# Patient Record
Sex: Female | Born: 1965 | Race: Black or African American | Hispanic: No | Marital: Single | Smoking: Current some day smoker
Health system: Southern US, Community
[De-identification: ages and names within clinical notes are randomized; demographics above are authoritative.]

## PROBLEM LIST (undated history)

## (undated) DIAGNOSIS — A599 Trichomoniasis, unspecified: Secondary | ICD-10-CM

## (undated) DIAGNOSIS — K219 Gastro-esophageal reflux disease without esophagitis: Secondary | ICD-10-CM

## (undated) DIAGNOSIS — J329 Chronic sinusitis, unspecified: Secondary | ICD-10-CM

## (undated) DIAGNOSIS — J45909 Unspecified asthma, uncomplicated: Secondary | ICD-10-CM

## (undated) DIAGNOSIS — I38 Endocarditis, valve unspecified: Secondary | ICD-10-CM

## (undated) DIAGNOSIS — R7303 Prediabetes: Secondary | ICD-10-CM

## (undated) DIAGNOSIS — M549 Dorsalgia, unspecified: Secondary | ICD-10-CM

## (undated) DIAGNOSIS — G43909 Migraine, unspecified, not intractable, without status migrainosus: Secondary | ICD-10-CM

## (undated) DIAGNOSIS — J449 Chronic obstructive pulmonary disease, unspecified: Secondary | ICD-10-CM

## (undated) DIAGNOSIS — R011 Cardiac murmur, unspecified: Secondary | ICD-10-CM

## (undated) HISTORY — DX: Cardiac murmur, unspecified: R01.1

## (undated) HISTORY — DX: Gastro-esophageal reflux disease without esophagitis: K21.9

## (undated) HISTORY — DX: Chronic sinusitis, unspecified: J32.9

## (undated) HISTORY — DX: Trichomoniasis, unspecified: A59.9

## (undated) HISTORY — PX: BREAST SURGERY: SHX581

## (undated) SURGERY — Surgical Case
Anesthesia: *Unknown

---

## 2004-04-24 ENCOUNTER — Emergency Department: Payer: Self-pay | Admitting: Unknown Physician Specialty

## 2005-11-16 ENCOUNTER — Emergency Department: Payer: Self-pay | Admitting: Internal Medicine

## 2005-11-21 ENCOUNTER — Ambulatory Visit: Payer: Self-pay | Admitting: Internal Medicine

## 2005-11-24 ENCOUNTER — Emergency Department: Payer: Self-pay | Admitting: Internal Medicine

## 2005-11-24 ENCOUNTER — Other Ambulatory Visit: Payer: Self-pay

## 2007-11-01 ENCOUNTER — Other Ambulatory Visit: Payer: Self-pay

## 2007-11-01 ENCOUNTER — Emergency Department: Payer: Self-pay | Admitting: Emergency Medicine

## 2007-11-05 ENCOUNTER — Ambulatory Visit: Payer: Self-pay | Admitting: Internal Medicine

## 2009-03-14 ENCOUNTER — Emergency Department: Payer: Self-pay | Admitting: Emergency Medicine

## 2013-03-05 LAB — URINALYSIS, COMPLETE
Bacteria: NONE SEEN
Blood: NEGATIVE
Glucose,UR: NEGATIVE mg/dL (ref 0–75)
Ketone: NEGATIVE
Leukocyte Esterase: NEGATIVE
Nitrite: NEGATIVE
Ph: 6 (ref 4.5–8.0)
Protein: 25
RBC,UR: 3 /HPF (ref 0–5)
Specific Gravity: 1.02 (ref 1.003–1.030)
Squamous Epithelial: 5
WBC UR: 2 /HPF (ref 0–5)

## 2013-03-05 LAB — COMPREHENSIVE METABOLIC PANEL
Albumin: 3.9 g/dL (ref 3.4–5.0)
Alkaline Phosphatase: 120 U/L (ref 50–136)
Anion Gap: 6 — ABNORMAL LOW (ref 7–16)
BUN: 7 mg/dL (ref 7–18)
Bilirubin,Total: 0.8 mg/dL (ref 0.2–1.0)
Calcium, Total: 9.3 mg/dL (ref 8.5–10.1)
Chloride: 104 mmol/L (ref 98–107)
Co2: 26 mmol/L (ref 21–32)
Creatinine: 0.86 mg/dL (ref 0.60–1.30)
EGFR (African American): >60
EGFR (Non-African Amer.): >60
Glucose: 90 mg/dL (ref 65–99)
Osmolality: 269 (ref 275–301)
Potassium: 3.6 mmol/L (ref 3.5–5.1)
SGOT(AST): 13 U/L — ABNORMAL LOW (ref 15–37)
SGPT (ALT): 17 U/L (ref 12–78)
Sodium: 136 mmol/L (ref 136–145)
Total Protein: 8.2 g/dL (ref 6.4–8.2)

## 2013-03-05 LAB — CBC
HCT: 40.4 % (ref 35.0–47.0)
HGB: 14.1 g/dL (ref 12.0–16.0)
MCH: 31.9 pg (ref 26.0–34.0)
MCHC: 34.9 g/dL (ref 32.0–36.0)
MCV: 91 fL (ref 80–100)
Platelet: 367 10*3/uL (ref 150–440)
RBC: 4.42 10*6/uL (ref 3.80–5.20)
RDW: 13.8 % (ref 11.5–14.5)
WBC: 19.6 10*3/uL — ABNORMAL HIGH (ref 3.6–11.0)

## 2013-03-05 LAB — LIPASE, BLOOD: Lipase: 57 U/L — ABNORMAL LOW (ref 73–393)

## 2013-03-05 LAB — GC/CHLAMYDIA PROBE AMP

## 2013-03-05 LAB — PREGNANCY, URINE: Pregnancy Test, Urine: NEGATIVE m[IU]/mL

## 2013-03-05 LAB — WET PREP, GENITAL

## 2013-03-06 ENCOUNTER — Observation Stay: Payer: Self-pay | Admitting: Obstetrics & Gynecology

## 2013-03-06 LAB — BASIC METABOLIC PANEL
Anion Gap: 7 (ref 7–16)
BUN: 5 mg/dL — ABNORMAL LOW (ref 7–18)
Calcium, Total: 8 mg/dL — ABNORMAL LOW (ref 8.5–10.1)
Chloride: 108 mmol/L — ABNORMAL HIGH (ref 98–107)
Co2: 23 mmol/L (ref 21–32)
Creatinine: 0.84 mg/dL (ref 0.60–1.30)
EGFR (African American): >60
EGFR (Non-African Amer.): >60
Glucose: 90 mg/dL (ref 65–99)
Osmolality: 272 (ref 275–301)
Potassium: 3.7 mmol/L (ref 3.5–5.1)
Sodium: 138 mmol/L (ref 136–145)

## 2013-03-06 LAB — CBC WITH DIFFERENTIAL/PLATELET
Basophil #: 0.1 10*3/uL (ref 0.0–0.1)
Basophil %: 0.7 %
Eosinophil #: 0.1 10*3/uL (ref 0.0–0.7)
Eosinophil %: 0.9 %
HCT: 33.3 % — ABNORMAL LOW (ref 35.0–47.0)
HGB: 11.4 g/dL — ABNORMAL LOW (ref 12.0–16.0)
Lymphocyte #: 2.7 10*3/uL (ref 1.0–3.6)
Lymphocyte %: 20.2 %
MCH: 31.7 pg (ref 26.0–34.0)
MCHC: 34.4 g/dL (ref 32.0–36.0)
MCV: 92 fL (ref 80–100)
Monocyte #: 0.5 x10 3/mm (ref 0.2–0.9)
Monocyte %: 3.8 %
Neutrophil #: 10 10*3/uL — ABNORMAL HIGH (ref 1.4–6.5)
Neutrophil %: 74.4 %
Platelet: 297 10*3/uL (ref 150–440)
RBC: 3.61 10*6/uL — ABNORMAL LOW (ref 3.80–5.20)
RDW: 13.9 % (ref 11.5–14.5)
WBC: 13.4 10*3/uL — ABNORMAL HIGH (ref 3.6–11.0)

## 2013-03-07 LAB — CBC WITH DIFFERENTIAL/PLATELET
Basophil #: 0 10*3/uL (ref 0.0–0.1)
Basophil %: 0.5 %
Eosinophil #: 0.2 10*3/uL (ref 0.0–0.7)
Eosinophil %: 2.1 %
HCT: 32.2 % — ABNORMAL LOW (ref 35.0–47.0)
HGB: 11.3 g/dL — ABNORMAL LOW (ref 12.0–16.0)
Lymphocyte #: 2.1 10*3/uL (ref 1.0–3.6)
Lymphocyte %: 27.3 %
MCH: 32.4 pg (ref 26.0–34.0)
MCHC: 35.3 g/dL (ref 32.0–36.0)
MCV: 92 fL (ref 80–100)
Monocyte #: 0.4 x10 3/mm (ref 0.2–0.9)
Monocyte %: 5.2 %
Neutrophil #: 5 10*3/uL (ref 1.4–6.5)
Neutrophil %: 64.9 %
Platelet: 283 10*3/uL (ref 150–440)
RBC: 3.5 10*6/uL — ABNORMAL LOW (ref 3.80–5.20)
RDW: 13.7 % (ref 11.5–14.5)
WBC: 7.8 10*3/uL (ref 3.6–11.0)

## 2013-05-22 ENCOUNTER — Inpatient Hospital Stay: Payer: Self-pay | Admitting: Surgery

## 2013-05-22 LAB — BASIC METABOLIC PANEL
Anion Gap: 5 — ABNORMAL LOW (ref 7–16)
BUN: 7 mg/dL (ref 7–18)
Calcium, Total: 9.1 mg/dL (ref 8.5–10.1)
Chloride: 107 mmol/L (ref 98–107)
Co2: 25 mmol/L (ref 21–32)
Creatinine: 0.97 mg/dL (ref 0.60–1.30)
EGFR (African American): >60
EGFR (Non-African Amer.): >60
Glucose: 142 mg/dL — ABNORMAL HIGH (ref 65–99)
Osmolality: 274 (ref 275–301)
Potassium: 3.7 mmol/L (ref 3.5–5.1)
Sodium: 137 mmol/L (ref 136–145)

## 2013-05-22 LAB — CBC WITH DIFFERENTIAL/PLATELET
Basophil #: 0.1 10*3/uL (ref 0.0–0.1)
Basophil %: 0.8 %
Eosinophil #: 0.2 10*3/uL (ref 0.0–0.7)
Eosinophil %: 2.5 %
HCT: 39.5 % (ref 35.0–47.0)
HGB: 13.8 g/dL (ref 12.0–16.0)
Lymphocyte #: 3.2 10*3/uL (ref 1.0–3.6)
Lymphocyte %: 35.4 %
MCH: 31.7 pg (ref 26.0–34.0)
MCHC: 34.8 g/dL (ref 32.0–36.0)
MCV: 91 fL (ref 80–100)
Monocyte #: 0.4 x10 3/mm (ref 0.2–0.9)
Monocyte %: 4.6 %
Neutrophil #: 5.1 10*3/uL (ref 1.4–6.5)
Neutrophil %: 56.7 %
Platelet: 373 10*3/uL (ref 150–440)
RBC: 4.34 10*6/uL (ref 3.80–5.20)
RDW: 13.6 % (ref 11.5–14.5)
WBC: 9 10*3/uL (ref 3.6–11.0)

## 2013-05-23 LAB — BASIC METABOLIC PANEL
Anion Gap: 4 — ABNORMAL LOW (ref 7–16)
BUN: 6 mg/dL — ABNORMAL LOW (ref 7–18)
Calcium, Total: 8.7 mg/dL (ref 8.5–10.1)
Chloride: 108 mmol/L — ABNORMAL HIGH (ref 98–107)
Co2: 27 mmol/L (ref 21–32)
Creatinine: 0.88 mg/dL (ref 0.60–1.30)
EGFR (African American): >60
EGFR (Non-African Amer.): >60
Glucose: 104 mg/dL — ABNORMAL HIGH (ref 65–99)
Osmolality: 275 (ref 275–301)
Potassium: 3.8 mmol/L (ref 3.5–5.1)
Sodium: 139 mmol/L (ref 136–145)

## 2013-05-23 LAB — CBC WITH DIFFERENTIAL/PLATELET
Basophil #: 0.1 10*3/uL (ref 0.0–0.1)
Basophil %: 0.7 %
Eosinophil #: 0.2 10*3/uL (ref 0.0–0.7)
Eosinophil %: 2.8 %
HCT: 36 % (ref 35.0–47.0)
HGB: 12.5 g/dL (ref 12.0–16.0)
Lymphocyte #: 3 10*3/uL (ref 1.0–3.6)
Lymphocyte %: 35.9 %
MCH: 31.7 pg (ref 26.0–34.0)
MCHC: 34.7 g/dL (ref 32.0–36.0)
MCV: 91 fL (ref 80–100)
Monocyte #: 0.5 x10 3/mm (ref 0.2–0.9)
Monocyte %: 6.6 %
Neutrophil #: 4.4 10*3/uL (ref 1.4–6.5)
Neutrophil %: 54 %
Platelet: 343 10*3/uL (ref 150–440)
RBC: 3.95 10*6/uL (ref 3.80–5.20)
RDW: 13.5 % (ref 11.5–14.5)
WBC: 8.3 10*3/uL (ref 3.6–11.0)

## 2013-05-23 LAB — PREGNANCY, URINE: Pregnancy Test, Urine: NEGATIVE m[IU]/mL

## 2013-05-27 LAB — WOUND CULTURE

## 2013-05-27 LAB — PATHOLOGY REPORT

## 2013-09-01 ENCOUNTER — Ambulatory Visit: Payer: Self-pay | Admitting: Urgent Care

## 2014-11-13 NOTE — H&P (Signed)
PATIENT NAME:  Teresa Deleon, BEAR MR#:  [id number] DATE OF BIRTH:  [birth year]  DATE OF ADMISSION:  05/22/2013  CHIEF COMPLAINT: Multiple abscesses.   HISTORY OF PRESENT ILLNESS: This is a patient with approximately 1 week history of multiple abscesses, the largest being in her left breast. She has had multiple others that have been drained in the Emergency Room, but this largest one on the left breast is causing her considerable pain and is too large to be drained in the ER. She ate lunch at 2:00 or 2:30 today. She has had fevers and chills 2 or 3 days ago. She denies nausea or vomiting, is tolerating a regular diet, and in fact ate lunch today. Has never had an episode like this before.  PAST MEDICAL HISTORY: None.   PAST SURGICAL HISTORY: None.   ALLERGIES: No known drug allergies.  MEDICATIONS: She states none, but there is doxycycline and hydrocodone on her medication reconciliation list.   FAMILY HISTORY: Noncontributory.   SOCIAL HISTORY: The patient works as a Quarry manager. She smokes tobacco. Does not drink alcohol.   REVIEW OF SYSTEMS: Ten system review has been performed and negative with the exception of that mentioned in the HPI.  PHYSICAL EXAMINATION: GENERAL: Healthy female patient.  VITAL SIGNS: Temperature 98.1, pulse 91, respirations 18, blood pressure 153/72. Pain scale of 5.  HEENT: No scleral icterus.  NECK: No palpable neck nodes.  CHEST: Clear to auscultation.  CARDIAC: Regular rate and rhythm.  ABDOMEN: Soft, nontender.  EXTREMITIES: Without edema.  NEUROLOGIC: Grossly intact.  INTEGUMENT: Shows multiple 1 or 2 mm unroofed abscesses right chest wall, right abdomen, right groin. She is somewhat hirsute. BREASTS: Shows no mass in either breast, except on the left inframammary fold is considerable induration with considerable erythema, tenderness and an eschar medially.   LABORATORY DATA: Demonstrate normal electrolytes. White blood cell count of 9.0, H and H of 13.8 and  39.5, and a platelet count 373.  IMAGING STUDIES: None performed.   ASSESSMENT AND PLAN: This is a patient with breast abscess. She has never had a mammogram before, but she has multiple abscesses all suggestive of methicillin-resistant Staphylococcus aureus. I have recommended admission to the hospital and because she ate lunch she cannot be given a general anesthetic until late this evening. Therefore, I will recommend keeping her n.p.o. after midnight, starting IV antibiotics and performing incision and drainage of her left breast which clearly shows an abscess that has been present for the last week. We will plan drainage in the operating room tomorrow. I discussed with her the rationale for this, the options of observation, the risk of bleeding, infection, cosmetic deformity including open wound or drain placement and the need for a mammogram in the future. She understood and agreed to proceed. ____________________________ Jerrol Banana Burt Knack, MD rec:sb D: 05/22/2013 16:02:53 ET T: 05/22/2013 16:37:01 ET JOB#: [id number]  cc: Jerrol Banana. Burt Knack, MD, <Dictator> Florene Glen MD ELECTRONICALLY SIGNED 05/22/2013 18:26

## 2014-11-13 NOTE — Op Note (Signed)
PATIENT NAME:  Teresa Deleon, Teresa Deleon MR#:  [id number] DATE OF BIRTH:  02-Apr-1966  DATE OF PROCEDURE:  05/23/2013  PREOPERATIVE DIAGNOSIS:  Left breast abscess.   POSTOPERATIVE DIAGNOSIS:  Left breast abscess.   PROCEDURE:  Incision, drainage and debridement of left breast abscess.   SURGEON:  Richard E. Burt Knack, M.D.   ANESTHESIA:  General with LMA.   INDICATIONS:  This is a patient with a left breast abscess and multiple smaller abscesses across her abdomen and chest. The larger abscess in the left breast requires incision and drainage. Preoperatively, we discussed rationale for surgery, the options of observation, the risk of bleeding, infection, recurrence, cosmetic deformity including open wound. This was all reviewed for her. She understood and agreed to proceed.   FINDINGS:  Multiloculated abscess cavities with purulence that was cultured. Another specimen was the eschar itself, which was excised using sharp dissection.   DESCRIPTION OF PROCEDURE:  The patient was induced with general anesthesia. She was on IV antibiotics and VTE prophylaxis was in place. She was prepped and draped in a sterile fashion. A lenticular-shaped incision was utilized to excise the eschar medially. This was done with a 10 blade and sharp dissection and it was sent off for examination. Placement of a large clamp into the cavity demonstrated two separate cavities; one fairly deep and one more superficial. Cultures were taken. The more superficial abscess tracked towards the nipple areolar complex. A counterincision was made on the lateral side of the breast, and both cavities were connected to this counterincision with blunt dissection, and then two separate Penrose drains were brought in through the wounds and placed into the separate cavities and tied in with 3-0 nylon. An attempt at closing the larger wound loosely was performed with a single horizontal mattress suture, and a single simple suture of 3-0 nylon, and a  sterile dressing was placed.   The patient tolerated the procedure well. There were no complications. She was taken to the Recovery Room in stable condition to be admitted for continued care.   ____________________________ Jerrol Banana. Burt Knack, MD rec:jm D: 05/23/2013 10:36:03 ET T: 05/23/2013 10:45:34 ET JOB#: [id number]  cc: Jerrol Banana. Burt Knack, MD, <Dictator> Florene Glen MD ELECTRONICALLY SIGNED 05/23/2013 11:06

## 2015-01-04 ENCOUNTER — Ambulatory Visit: Payer: Self-pay

## 2015-01-13 ENCOUNTER — Ambulatory Visit
Admission: RE | Admit: 2015-01-13 | Discharge: 2015-01-13 | Disposition: A | Discharge status: Home / Self Care | Payer: Self-pay | Source: Ambulatory Visit | Attending: Oncology | Admitting: Oncology

## 2015-01-13 ENCOUNTER — Ambulatory Visit: Payer: Self-pay | Attending: Oncology

## 2015-01-13 VITALS — BP 123/85 | HR 77 | Temp 98.1°F | Resp 18 | Ht 61.81 in | Wt 152.6 lb

## 2015-01-13 DIAGNOSIS — Z0000 Encounter for general adult medical examination without abnormal findings: Secondary | ICD-10-CM

## 2015-01-13 NOTE — Progress Notes (Signed)
Subjective:     Patient ID: Teresa Deleon, female   DOB: 08/19/1965, 49 y.o.   MRN: [id number]  HPI   Review of Systems     Objective:   Physical Exam  Pulmonary/Chest: Right breast exhibits no inverted nipple, no mass, no nipple discharge, no skin change and no tenderness. Left breast exhibits no inverted nipple, no mass, no nipple discharge, no skin change and no tenderness. Breasts are symmetrical.       Assessment:     49 year old Patient presents for [place] clinic visit.  Patient screened, and meets BCCCP eligibility.  Patient does not have insurance, Medicare or Medicaid.  Handout given on Affordable Care Act.  CBE unremarkable.  Instructed patient on breast self-exam using teach back method. Pelvic exam normal.  Specimen collected for pap.  Sent for baseline bilateral screening mammogram.

## 2015-01-15 LAB — PAP LB AND HPV HIGH-RISK
HPV, high-risk: NEGATIVE
PAP Smear Comment: 0

## 2015-01-18 NOTE — Progress Notes (Signed)
Phoned patient to notify of normal mammogram results, and negative pap results.  Explained pap results showed trichomonas.  Mailed patient education sheet.  Will call in metronidazole 2 grams by mouth once to [facility] on [address]. Per protocol. Copy to [facility].

## 2015-09-24 ENCOUNTER — Ambulatory Visit
Admission: EM | Admit: 2015-09-24 | Discharge: 2015-09-24 | Disposition: A | Discharge status: Home / Self Care | Payer: BLUE CROSS/BLUE SHIELD | Attending: Family Medicine | Admitting: Family Medicine

## 2015-09-24 ENCOUNTER — Ambulatory Visit (INDEPENDENT_AMBULATORY_CARE_PROVIDER_SITE_OTHER): Payer: BLUE CROSS/BLUE SHIELD

## 2015-09-24 ENCOUNTER — Encounter: Payer: Self-pay | Admitting: Emergency Medicine

## 2015-09-24 DIAGNOSIS — R0789 Other chest pain: Secondary | ICD-10-CM

## 2015-09-24 DIAGNOSIS — R079 Chest pain, unspecified: Secondary | ICD-10-CM | POA: Diagnosis not present

## 2015-09-24 DIAGNOSIS — K219 Gastro-esophageal reflux disease without esophagitis: Secondary | ICD-10-CM | POA: Diagnosis not present

## 2015-09-24 MED ORDER — GI COCKTAIL ~~LOC~~
30.0000 mL | Freq: Once | ORAL | Status: AC
  Administered 2015-09-24: 30 mL via ORAL

## 2015-09-24 NOTE — ED Notes (Signed)
Pt presents with upper back pain and chest pain started two weeks ago. Pt reports hx of leaking heart valve, pain comes and goes at times, worse when she takes a deep breath.

## 2015-09-24 NOTE — ED Provider Notes (Signed)
CSN: [id number]     Arrival date & time 09/24/15  1210 History   First MD Initiated Contact with Patient 09/24/15 1246     Chief Complaint  Patient presents with  . Back Pain  . Chest Pain   (Consider location/radiation/quality/duration/timing/severity/associated sxs/prior Treatment) HPI Comments: 50 yo female with a 2-3 weeks h/o mid chest discomfort. States pain comes and goes and burning. States has a h/o acid reflux. Denies diaphoresis, neck/jaw pain, left arm pain, shortness of breath.   Patient is a 50 y.o. female presenting with chest pain. The history is provided by the patient.  Chest Pain Pain location:  Substernal area Pain quality: burning   Pain radiates to:  Does not radiate Pain radiates to the back: no   Pain severity:  Moderate Onset quality:  Sudden Duration:  2 weeks Timing:  Constant Chronicity:  New Relieved by:  None tried Ineffective treatments:  None tried Associated symptoms: no AICD problem, no altered mental status, no anorexia, no back pain, no dizziness, no dysphagia, no fatigue, no fever, no headache, no heartburn, no lower extremity edema, no nausea, no near-syncope, no numbness, no orthopnea, no palpitations, no PND, no shortness of breath, no syncope, not vomiting and no weakness   Risk factors: smoking   Risk factors: no aortic disease, no birth control, no coronary artery disease, no diabetes mellitus, no Marfan's syndrome, not pregnant and no prior DVT/PE     History reviewed. No pertinent past medical history. History reviewed. No pertinent past surgical history. No family history on file. Social History  Substance Use Topics  . Smoking status: Current Some Day Smoker  . Smokeless tobacco: None  . Alcohol Use: No   OB History    No data available     Review of Systems  Constitutional: Negative for fever and fatigue.  HENT: Negative for trouble swallowing.   Respiratory: Negative for shortness of breath.   Cardiovascular: Positive for  chest pain. Negative for palpitations, orthopnea, syncope, PND and near-syncope.  Gastrointestinal: Negative for heartburn, nausea, vomiting and anorexia.  Musculoskeletal: Negative for back pain.  Neurological: Negative for dizziness, weakness, numbness and headaches.    Allergies  Review of patient's allergies indicates no known allergies.  Home Medications   Prior to Admission medications   Not on File   Meds Ordered and Administered this Visit   Medications  gi cocktail (Maalox,Lidocaine,Donnatal) (30 mLs Oral Given 09/24/15 1258)    BP 149/119 mmHg  Pulse 86  Temp(Src) 98.6 F (37 C)  Resp 18  Ht 5' (1.524 m)  Wt 154 lb (69.854 kg)  BMI 30.08 kg/m2  SpO2 97%  LMP  No data found.   Physical Exam  Constitutional: She appears well-developed and well-nourished. No distress.  HENT:  Head: Normocephalic.  Right Ear: Tympanic membrane, external ear and ear canal normal.  Left Ear: Tympanic membrane, external ear and ear canal normal.  Nose: Nose normal.  Mouth/Throat: Uvula is midline, oropharynx is clear and moist and mucous membranes are normal.  Eyes: Conjunctivae and EOM are normal. Pupils are equal, round, and reactive to light. Right eye exhibits no discharge. Left eye exhibits no discharge. No scleral icterus.  Neck: Normal range of motion. Neck supple. No JVD present. No tracheal deviation present. No thyromegaly present.  Cardiovascular: Normal rate, regular rhythm, normal heart sounds and intact distal pulses.   No murmur heard. Pulmonary/Chest: Effort normal and breath sounds normal. No stridor. No respiratory distress. She has no wheezes. She has no  rales. She exhibits no tenderness.  Abdominal: Soft. Bowel sounds are normal. She exhibits no distension and no mass. There is no tenderness. There is no rebound and no guarding.  Lymphadenopathy:    She has no cervical adenopathy.  Neurological: She is alert.  Skin: Skin is warm and dry. No rash noted. She is not  diaphoretic.  Nursing note and vitals reviewed.   ED Course  Procedures (including critical care time)  Labs Review Labs Reviewed - No data to display  Imaging Review Dg Chest 2 View  09/24/2015  CLINICAL DATA:  Pain under LEFT breast for 2 weeks, history asthma, smoking, valvular heart disease EXAM: CHEST  2 VIEW COMPARISON:  By 10/2005 FINDINGS: Normal heart size, mediastinal contours, and pulmonary vascularity. Lungs clear. No pleural effusion or pneumothorax. Bones unremarkable. IMPRESSION: No acute abnormalities. Electronically Signed   By: Lavonia Dana M.D.   On: 09/24/2015 13:41     Visual Acuity Review  Right Eye Distance:   Left Eye Distance:   Bilateral Distance:    Right Eye Near:   Left Eye Near:    Bilateral Near:       EKG: normal EKG, normal sinus rhythm, there are no previous tracings available for comparison; reviewed by me and agree.  MDM   1. Other chest pain   2. Gastroesophageal reflux disease, esophagitis presence not specified    There are no discharge medications for this patient.  1. EKG, x-ray results and diagnosis reviewed with patient 2. rx as per orders above; reviewed possible side effects, interactions, risks and benefits  3. Patient given GI cocktail with resolution of symptoms 4.  Recommend supportive treatment with otc acid reflux medication 5. Follow-up prn if symptoms worsen or don't improve    Norval Gable, MD 09/24/15 2025

## 2015-11-02 ENCOUNTER — Encounter: Payer: Self-pay | Admitting: Emergency Medicine

## 2015-11-02 ENCOUNTER — Emergency Department
Admission: EM | Admit: 2015-11-02 | Discharge: 2015-11-02 | Disposition: A | Discharge status: Home / Self Care | Payer: BLUE CROSS/BLUE SHIELD | Attending: Emergency Medicine | Admitting: Emergency Medicine

## 2015-11-02 DIAGNOSIS — F17200 Nicotine dependence, unspecified, uncomplicated: Secondary | ICD-10-CM | POA: Insufficient documentation

## 2015-11-02 DIAGNOSIS — M5441 Lumbago with sciatica, right side: Secondary | ICD-10-CM | POA: Insufficient documentation

## 2015-11-02 DIAGNOSIS — Z791 Long term (current) use of non-steroidal anti-inflammatories (NSAID): Secondary | ICD-10-CM | POA: Insufficient documentation

## 2015-11-02 HISTORY — DX: Dorsalgia, unspecified: M54.9

## 2015-11-02 MED ORDER — BACLOFEN 10 MG PO TABS: 10.0000 mg | ORAL_TABLET | Freq: Four times a day (QID) | ORAL | Status: AC

## 2015-11-02 MED ORDER — NAPROXEN 500 MG PO TABS: 500.0000 mg | ORAL_TABLET | Freq: Two times a day (BID) | ORAL | Status: DC

## 2015-11-02 MED ORDER — KETOROLAC TROMETHAMINE 60 MG/2ML IM SOLN
60.0000 mg | Freq: Once | INTRAMUSCULAR | Status: AC
  Administered 2015-11-02: 60 mg via INTRAMUSCULAR
  Filled 2015-11-02: qty 2

## 2015-11-02 NOTE — ED Notes (Signed)
Patient presents to the ED with left sided back and hip pain that started yesterday morning.  Patient reports history of back pain.  Patient is in no obvious distress at this time.

## 2015-11-02 NOTE — ED Provider Notes (Signed)
[facility] Emergency Department Provider Note  ____________________________________________  Time seen: Approximately 1:05 PM  I have reviewed the triage vital signs and the nursing notes.   HISTORY  Chief Complaint Back Pain    HPI Teresa Deleon is a 50 y.o. female who presents for evaluation of left-sided back pain times one day. Patient states that she's had similar episodes in the past. Patient states is uncomfortable to lay flat on her back or stand.   Past Medical History  Diagnosis Date  . Back pain     There are no active problems to display for this patient.   History reviewed. No pertinent past surgical history.  Current Outpatient Rx  Name  Route  Sig  Dispense  Refill  . cyclobenzaprine (FLEXERIL) 10 MG tablet   Oral   Take 10 mg by mouth 3 (three) times daily as needed for muscle spasms.         Marland Kitchen ibuprofen (ADVIL,MOTRIN) 800 MG tablet   Oral   Take 800 mg by mouth every 8 (eight) hours as needed.         . loratadine-pseudoephedrine (CLARITIN-D 24-HOUR) 10-240 MG 24 hr tablet   Oral   Take 1 tablet by mouth daily.         . meloxicam (MOBIC) 15 MG tablet   Oral   Take 15 mg by mouth daily.         . baclofen (LIORESAL) 10 MG tablet   Oral   Take 1 tablet (10 mg total) by mouth 4 (four) times daily.   120 tablet   0   . naproxen (NAPROSYN) 500 MG tablet   Oral   Take 1 tablet (500 mg total) by mouth 2 (two) times daily with a meal.   60 tablet   0     Allergies Review of patient's allergies indicates no known allergies.  History reviewed. No pertinent family history.  Social History Social History  Substance Use Topics  . Smoking status: Current Some Day Smoker  . Smokeless tobacco: None  . Alcohol Use: Yes    Review of Systems Constitutional: No fever/chills Cardiovascular: Denies chest pain. Respiratory: Denies shortness of breath. Genitourinary: Negative for dysuria. Musculoskeletal:  Positive for back pain Skin: Negative for rash. Neurological: Negative for headaches, focal weakness or numbness.  10-point ROS otherwise negative.  ____________________________________________   PHYSICAL EXAM:  VITAL SIGNS: ED Triage Vitals  Enc Vitals Group     BP 11/02/15 1254 132/80 mmHg     Pulse Rate 11/02/15 1254 87     Resp 11/02/15 1254 18     Temp 11/02/15 1254 97.7 F (36.5 C)     Temp Source 11/02/15 1254 Oral     SpO2 11/02/15 1254 97 %     Weight 11/02/15 1254 158 lb (71.668 kg)     Height 11/02/15 1254 5\' 1"  (1.549 m)     Head Cir --      Peak Flow --      Pain Score 11/02/15 1243 10     Pain Loc --      Pain Edu? --      Excl. in [place]? --     Constitutional: Alert and oriented. Well appearing and in no acute distress. Cardiovascular: Normal rate, regular rhythm. Grossly normal heart sounds.  Good peripheral circulation. Respiratory: Normal respiratory effort.  No retractions. Lungs CTAB. Gastrointestinal: Soft and nontender. No distention.No CVA tenderness. Musculoskeletal: Positive right-sided paraspinal tenderness. Straight leg raise positive  on the right and negative on the left. Distally neurovascularly intact. No ecchymosis or bruising noted. Neurologic:  Normal speech and language. No gross focal neurologic deficits are appreciated. No gait instability. Skin:  Skin is warm, dry and intact. No rash noted. Psychiatric: Mood and affect are normal. Speech and behavior are normal.  ____________________________________________   LABS (all labs ordered are listed, but only abnormal results are displayed)  Labs Reviewed - No data to display ____________________________________________   PROCEDURES  Procedure(s) performed: None  Critical Care performed: No  ____________________________________________   INITIAL IMPRESSION / ASSESSMENT AND PLAN / ED COURSE  Pertinent labs & imaging results that were available during my care of the patient were  reviewed by me and considered in my medical decision making (see chart for details).  Acute onset of lumbar pain. Rx given for baclofen 10 mg 4 times a day and Naprosyn 500 mg twice a day. Patient follow-up with PCP or return to the ER with any worsening symptomology. Patient voices no other emergency medical complaints at this time. ____________________________________________   FINAL CLINICAL IMPRESSION(S) / ED DIAGNOSES  Final diagnoses:  Right-sided low back pain with right-sided sciatica     This chart was dictated using voice recognition software/Dragon. Despite best efforts to proofread, errors can occur which can change the meaning. Any change was purely unintentional.   Arlyss Repress, PA-C 11/02/15 1330  Lavonia Drafts, MD 11/02/15 [phone]

## 2015-11-02 NOTE — Discharge Instructions (Signed)

## 2015-11-02 NOTE — ED Notes (Signed)
States she is having left sided back pain since yesterday. States she had had similar episodes to right in past. Denies injury

## 2016-02-26 ENCOUNTER — Ambulatory Visit
Admission: EM | Admit: 2016-02-26 | Discharge: 2016-02-26 | Disposition: A | Discharge status: Home / Self Care | Payer: No Typology Code available for payment source | Attending: Family Medicine | Admitting: Family Medicine

## 2016-02-26 ENCOUNTER — Encounter: Payer: Self-pay | Admitting: Gynecology

## 2016-02-26 DIAGNOSIS — K529 Noninfective gastroenteritis and colitis, unspecified: Secondary | ICD-10-CM | POA: Diagnosis not present

## 2016-02-26 HISTORY — DX: Migraine, unspecified, not intractable, without status migrainosus: G43.909

## 2016-02-26 HISTORY — DX: Unspecified asthma, uncomplicated: J45.909

## 2016-02-26 HISTORY — DX: Endocarditis, valve unspecified: I38

## 2016-02-26 MED ORDER — PROMETHAZINE HCL 25 MG PO TABS: 25.0000 mg | ORAL_TABLET | Freq: Four times a day (QID) | ORAL | 0 refills | Status: DC | PRN

## 2016-02-26 MED ORDER — ONDANSETRON HCL 4 MG PO TABS: 4.0000 mg | ORAL_TABLET | Freq: Three times a day (TID) | ORAL | 0 refills | Status: DC | PRN

## 2016-02-26 NOTE — ED Triage Notes (Signed)
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 Pt. Sx vomiting / chills / and diarrhea x this morning.

## 2016-02-26 NOTE — ED Provider Notes (Signed)
CSN: [id number]     Arrival date & time 02/26/16  1300 History   First MD Initiated Contact with Patient 02/26/16 1341     Chief Complaint  Patient presents with  . Emesis  . Diarrhea   HPI the patient presents today for evaluation of nausea and vomiting starting at approximately 4 AM this morning. 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 She reports the person chills at home, did not measure her temperature. She reports 5 episodes of emesis, denies any blood in states that it is yellow in color. 8 episodes of diarrhea, loose stool denies blood or watery appearance. She denies any sharp abdominal pain, does report bloating and 8 cramping sensation. Denies any propane, she is nauseated databases. Denies any recent alcohol intake. She does get her flu shot every year. Past Medical History:  Diagnosis Date  . Asthma   . Back pain   . Leaky heart valve   . Migraine    Past Surgical History:  Procedure Laterality Date  . BREAST SURGERY     left breast abcess   No family history on file. Social History  Substance Use Topics  . Smoking status: Current Some Day Smoker  . Smokeless tobacco: Never Used  . Alcohol use Yes   OB History    No data available     Review of Systems  Constitutional: Positive for chills.  HENT: Negative.   Eyes: Negative.   Respiratory: Negative.   Cardiovascular: Negative.   Gastrointestinal: Positive for abdominal distention, diarrhea, nausea and vomiting.  Endocrine: Negative.     Allergies  Review of patient's allergies indicates no known allergies.  Home Medications   Prior to Admission medications   Medication Sig Start Date End Date Taking? Authorizing Provider  baclofen (LIORESAL) 10 MG tablet Take 1 tablet (10 mg total) by mouth 4 (four) times daily. 11/02/15 11/01/16 Yes Charles M Beers, PA-C  cyclobenzaprine (FLEXERIL) 10 MG  tablet Take 10 mg by mouth 3 (three) times daily as needed for muscle spasms.   Yes Historical Provider, MD  ibuprofen (ADVIL,MOTRIN) 800 MG tablet Take 800 mg by mouth every 8 (eight) hours as needed.   Yes Historical Provider, MD  loratadine-pseudoephedrine (CLARITIN-D 24-HOUR) 10-240 MG 24 hr tablet Take 1 tablet by mouth daily.   Yes Historical Provider, MD  meloxicam (MOBIC) 15 MG tablet Take 15 mg by mouth daily.   Yes Historical Provider, MD  naproxen (NAPROSYN) 500 MG tablet Take 1 tablet (500 mg total) by mouth 2 (two) times daily with a meal. 11/02/15  Yes Pierce Crane Beers, PA-C  ondansetron (ZOFRAN) 4 MG tablet Take 1 tablet (4 mg total) by mouth every 8 (eight) hours as needed for nausea or vomiting. 02/26/16   Lattie Corns, PA-C  promethazine (PHENERGAN) 25 MG tablet Take 1 tablet (25 mg total) by mouth every 6 (six) hours as needed for nausea or vomiting. 02/26/16   Lattie Corns, PA-C   Meds Ordered and Administered this Visit  Medications - No data to display  BP 135/90 (BP Location: Left Arm)   Pulse 73   Temp 98.3 F (36.8 C) (Oral)   Resp 16   Ht 5\' 1"  (1.549 m)   Wt 156 lb (70.8 kg)   SpO2 100%   BMI 29.48 kg/m  No data found.   Physical Exam  Constitutional: Vital signs are  normal. She appears well-developed and well-nourished.  HENT:  Head: Normocephalic and atraumatic.  Mouth/Throat: Mucous membranes are dry. No tonsillar exudate.  Mild erythema to the posterior oropharynx  Neck:  No palpable cervical adenopathy  Cardiovascular: Normal rate, regular rhythm, S1 normal, S2 normal, normal heart sounds and normal pulses.   Pulmonary/Chest: Effort normal and breath sounds normal.  Abdominal: Soft. She exhibits distension. She exhibits no shifting dullness, no fluid wave and no mass. Bowel sounds are increased. There is tenderness in the left lower quadrant. There is no rigidity, no rebound, no guarding and negative Murphy's sign.  Skin: She is not  diaphoretic.    Urgent Care Course   Clinical Course    Procedures (including critical care time)  Labs Review Labs Reviewed - No data to display  Imaging Review No results found.  MDM   1. Gastroenteritis    1.  Treatment options were discussed today with the patient. 2.  The patient was prescribed Zofran in addition to phenergan take for nausea. Encouraged water intake as well as Gatorade and encouraged a bland diet over the next 48 hours. Inform the patient that vitals are stable at today's visit.  Imodium for diarrhea. 3.  The patient was given a workout to keep her out of work for the next 48 hours. She was instructed that if she notices any blood in her vomit or stool or dizziness to return for repeat violation.   Lattie Corns, [place] 02/26/16 [phone]

## 2016-02-26 NOTE — Discharge Instructions (Signed)
-  Zofran for nausea, phenergan if zofran not effective -Bland diet today and tomorrow, water and crackers -If dizzy, increased heart rate or bleeding, follow-up with Urgent Care or ER

## 2016-03-02 ENCOUNTER — Encounter: Payer: Self-pay | Admitting: Medical Oncology

## 2016-03-02 ENCOUNTER — Emergency Department
Admission: EM | Admit: 2016-03-02 | Discharge: 2016-03-02 | Disposition: A | Discharge status: Home / Self Care | Payer: No Typology Code available for payment source | Attending: Emergency Medicine | Admitting: Emergency Medicine

## 2016-03-02 ENCOUNTER — Emergency Department: Payer: No Typology Code available for payment source

## 2016-03-02 DIAGNOSIS — Y999 Unspecified external cause status: Secondary | ICD-10-CM | POA: Insufficient documentation

## 2016-03-02 DIAGNOSIS — Y929 Unspecified place or not applicable: Secondary | ICD-10-CM | POA: Diagnosis not present

## 2016-03-02 DIAGNOSIS — W108XXA Fall (on) (from) other stairs and steps, initial encounter: Secondary | ICD-10-CM | POA: Diagnosis not present

## 2016-03-02 DIAGNOSIS — Y939 Activity, unspecified: Secondary | ICD-10-CM | POA: Diagnosis not present

## 2016-03-02 DIAGNOSIS — M5442 Lumbago with sciatica, left side: Secondary | ICD-10-CM

## 2016-03-02 DIAGNOSIS — M545 Low back pain: Secondary | ICD-10-CM | POA: Diagnosis present

## 2016-03-02 DIAGNOSIS — F17200 Nicotine dependence, unspecified, uncomplicated: Secondary | ICD-10-CM | POA: Diagnosis not present

## 2016-03-02 DIAGNOSIS — J45909 Unspecified asthma, uncomplicated: Secondary | ICD-10-CM | POA: Insufficient documentation

## 2016-03-02 DIAGNOSIS — Z79899 Other long term (current) drug therapy: Secondary | ICD-10-CM | POA: Diagnosis not present

## 2016-03-02 LAB — URINALYSIS COMPLETE WITH MICROSCOPIC (ARMC ONLY)
Bacteria, UA: NONE SEEN
Bilirubin Urine: NEGATIVE
Glucose, UA: NEGATIVE mg/dL
Ketones, ur: NEGATIVE mg/dL
Leukocytes, UA: NEGATIVE
Nitrite: NEGATIVE
Protein, ur: NEGATIVE mg/dL
RBC / HPF: NONE SEEN RBC/hpf (ref 0–5)
Specific Gravity, Urine: 1.01 (ref 1.005–1.030)
WBC, UA: NONE SEEN WBC/hpf (ref 0–5)
pH: 6 (ref 5.0–8.0)

## 2016-03-02 MED ORDER — HYDROCODONE-ACETAMINOPHEN 5-325 MG PO TABS: 1.0000 | ORAL_TABLET | ORAL | 0 refills | Status: DC | PRN

## 2016-03-02 MED ORDER — PREDNISONE 10 MG PO TABS: ORAL_TABLET | ORAL | 0 refills | Status: DC

## 2016-03-02 NOTE — ED Provider Notes (Signed)
[facility] Emergency Department Provider Note  ___________________________________________   First MD Initiated Contact with Patient 03/02/16 1601     (approximate)  I have reviewed the triage vital signs and the nursing notes.   HISTORY  Chief Complaint Back Pain and Fall    HPI Teresa ROETHEL is a 50 y.o. female is here with complaint of back pain after she fellonto a wet wooden step when she slipped on Monday. Patient denies any head injury or loss of consciousness. She has continued to have back pain since that time that now radiates down her left leg. Patient states she is also seen some occasional spotting but states that her last missed her period was a year ago. Patient also gives a history of back pain in the past similar to what she is experiencing now. She has not taken any over-the-counter medication for her pain. She denies any nausea, vomiting, fever or chills. She denies any incontinence of bowel or bladder. Currently she rates her pain as a 10 over 10. Patient did drive herself to the emergency room despite her pain.  Past Medical History:  Diagnosis Date  . Asthma   . Back pain   . Leaky heart valve   . Migraine     There are no active problems to display for this patient.   Past Surgical History:  Procedure Laterality Date  . BREAST SURGERY     left breast abcess    Prior to Admission medications   Medication Sig Start Date End Date Taking? Authorizing Provider  baclofen (LIORESAL) 10 MG tablet Take 1 tablet (10 mg total) by mouth 4 (four) times daily. 11/02/15 11/01/16  Pierce Crane Beers, PA-C  cyclobenzaprine (FLEXERIL) 10 MG tablet Take 10 mg by mouth 3 (three) times daily as needed for muscle spasms.    Historical Provider, MD  HYDROcodone-acetaminophen (NORCO/VICODIN) 5-325 MG tablet Take 1 tablet by mouth every 4 (four) hours as needed for moderate pain. 03/02/16   Johnn Hai, PA-C  ibuprofen (ADVIL,MOTRIN) 800 MG tablet Take  800 mg by mouth every 8 (eight) hours as needed.    Historical Provider, MD  loratadine-pseudoephedrine (CLARITIN-D 24-HOUR) 10-240 MG 24 hr tablet Take 1 tablet by mouth daily.    Historical Provider, MD  meloxicam (MOBIC) 15 MG tablet Take 15 mg by mouth daily.    Historical Provider, MD  ondansetron (ZOFRAN) 4 MG tablet Take 1 tablet (4 mg total) by mouth every 8 (eight) hours as needed for nausea or vomiting. 02/26/16   Lattie Corns, PA-C  predniSONE (DELTASONE) 10 MG tablet Take 6 tablets  today, on day 2 take 5 tablets, day 3 take 4 tablets, day 4 take 3 tablets, day 5 take  2 tablets and 1 tablet the last day 03/02/16   Johnn Hai, PA-C  promethazine (PHENERGAN) 25 MG tablet Take 1 tablet (25 mg total) by mouth every 6 (six) hours as needed for nausea or vomiting. 02/26/16   Lattie Corns, PA-C    Allergies Review of patient's allergies indicates no known allergies.  No family history on file.  Social History Social History  Substance Use Topics  . Smoking status: Current Some Day Smoker  . Smokeless tobacco: Never Used  . Alcohol use Yes    Review of Systems Constitutional: No fever/chills Eyes: No visual changes. ENT: No trauma Cardiovascular: Denies chest pain. Respiratory: Denies shortness of breath. Gastrointestinal: No abdominal pain.  No nausea, no vomiting.   Genitourinary: Negative  for dysuria. Questionable hematuria. Musculoskeletal: Positive for back pain. Skin: Negative for rash. Neurological: Negative for headaches. Positive paresthesias left leg.  10-point ROS otherwise negative.  ____________________________________________   PHYSICAL EXAM:  VITAL SIGNS: ED Triage Vitals  Enc Vitals Group     BP 03/02/16 1547 125/80     Pulse Rate 03/02/16 1547 88     Resp 03/02/16 1547 16     Temp 03/02/16 1547 98.2 F (36.8 C)     Temp Source 03/02/16 1547 Oral     SpO2 03/02/16 1547 98 %     Weight 03/02/16 1548 156 lb (70.8 kg)     Height  03/02/16 1548 5\' 1"  (1.549 m)     Head Circumference --      Peak Flow --      Pain Score 03/02/16 1548 10     Pain Loc --      Pain Edu? --      Excl. in [place]? --     Constitutional: Alert and oriented. Well appearing and in no acute distress. Eyes: Conjunctivae are normal. PERRL. EOMI. Head: Atraumatic. Ears:  EAC's and TM's normal bilaterally. Nose: No congestion/rhinnorhea. Neck: No stridor.   Cardiovascular: Normal rate, regular rhythm. Grossly normal heart sounds.  Good peripheral circulation. Respiratory: Normal respiratory effort.  No retractions. Lungs CTAB. Gastrointestinal: Soft and nontender. No distention. Bowel sounds normoactive 4 quadrants. Musculoskeletal: Examination of the back there is no gross deformity. There is moderate tenderness on palpation of the lumbar spine including L4, L5, S1 area. There is also paravertebral muscle tenderness. Patient was slow to getting from a supine position to sitting position with discomfort. Neurologic:  Normal speech and language. No gross focal neurologic deficits are appreciated. No gait instability. Skin:  Skin is warm, dry and intact. No rash noted. Psychiatric: Mood and affect are normal. Speech and behavior are normal.  ____________________________________________   LABS (all labs ordered are listed, but only abnormal results are displayed)  Labs Reviewed  URINALYSIS COMPLETEWITH MICROSCOPIC (Jud) - Abnormal; Notable for the following:       Result Value   Color, Urine YELLOW (*)    APPearance CLEAR (*)    Hgb urine dipstick 2+ (*)    Squamous Epithelial / LPF 0-5 (*)    All other components within normal limits    RADIOLOGY  Lumbar spine x-ray per radiologist: IMPRESSION: 1. No evidence of acute fracture, malalignment or significant degenerative change. 2.  Aortic Atherosclerosis (ICD10-170.0) ____________________________________________   PROCEDURES  Procedure(s) performed:  None  Procedures  Critical Care performed: No  ____________________________________________   INITIAL IMPRESSION / ASSESSMENT AND PLAN / ED COURSE  Pertinent labs & imaging results that were available during my care of the patient were reviewed by me and considered in my medical decision making (see chart for details).    Clinical Course  Patient was not given any pain medication while in the emergency room since patient was the driver of her vehicle. Patient was given a prescription for prednisone 60 mg 6 day taper along with Norco as needed for severe pain. Patient is to follow-up with her primary care doctor or Dr. Rudene Christians who is on call for orthopedics. Patient was made aware that her urinalysis did not show a urinary tract infection. ____________________________________________   FINAL CLINICAL IMPRESSION(S) / ED DIAGNOSES  Final diagnoses:  Midline low back pain with left-sided sciatica      NEW MEDICATIONS STARTED DURING THIS VISIT:  New Prescriptions   HYDROCODONE-ACETAMINOPHEN (  NORCO/VICODIN) 5-325 MG TABLET    Take 1 tablet by mouth every 4 (four) hours as needed for moderate pain.   PREDNISONE (DELTASONE) 10 MG TABLET    Take 6 tablets  today, on day 2 take 5 tablets, day 3 take 4 tablets, day 4 take 3 tablets, day 5 take  2 tablets and 1 tablet the last day     Note:  This document was prepared using Dragon voice recognition software and may include unintentional dictation errors.   Johnn Hai, PA-C 03/02/16 1809    Nena Polio, MD 03/03/16 [phone]

## 2016-03-02 NOTE — ED Triage Notes (Signed)
Pt reports she began having lower back pain with radiation of pain into legs since she slipped and fell Monday on wet steps. Pt denies head injury.

## 2016-03-02 NOTE — Discharge Instructions (Signed)
Follow-up with your primary care doctor or Dr. Rudene Christians who is on-call for orthopedics if any continued problems with your back. Begin taking prednisone as directed. Take Norco for severe pain if needed. Be aware that this medication can cause drowsiness and should not take while driving.

## 2016-05-04 ENCOUNTER — Encounter: Payer: Self-pay | Admitting: Emergency Medicine

## 2016-05-04 ENCOUNTER — Ambulatory Visit
Admission: EM | Admit: 2016-05-04 | Discharge: 2016-05-04 | Disposition: A | Discharge status: Home / Self Care | Payer: No Typology Code available for payment source | Attending: Emergency Medicine | Admitting: Emergency Medicine

## 2016-05-04 DIAGNOSIS — G43009 Migraine without aura, not intractable, without status migrainosus: Secondary | ICD-10-CM | POA: Diagnosis not present

## 2016-05-04 MED ORDER — KETOROLAC TROMETHAMINE 60 MG/2ML IM SOLN
60.0000 mg | Freq: Once | INTRAMUSCULAR | Status: AC
  Administered 2016-05-04: 60 mg via INTRAMUSCULAR

## 2016-05-04 MED ORDER — NAPROXEN 500 MG PO TABS: 500.0000 mg | ORAL_TABLET | Freq: Two times a day (BID) | ORAL | 0 refills | Status: DC

## 2016-05-04 MED ORDER — ONDANSETRON 8 MG PO TBDP
8.0000 mg | ORAL_TABLET | Freq: Once | ORAL | Status: AC
  Administered 2016-05-04: 8 mg via ORAL

## 2016-05-04 NOTE — ED Provider Notes (Signed)
CSN: [id number]     Arrival date & time 05/04/16  1447 History   First MD Initiated Contact with Patient 05/04/16 1518     Chief Complaint  Patient presents with  . Headache   (Consider location/radiation/quality/duration/timing/severity/associated sxs/prior Treatment) HPI  This a 50 year old female who presents with a four-day history of unremitting right-sided throbbing headache. He has a history of headaches spanning 30 years. He says ever 2 weeks prior to the onset of the severe headache she's had small type headaches in the same area. She's had nausea and vomiting. She has taken some Phenergan at home which helped a little bit. She states that home use of Naprosyn as not provided her any relief. At the present time her headache is rated 7 out of 10. She does exhibit some photosensitivity. She denies any peripheral loss of function or numbness or tingling. She's had no visual disturbances       Past Medical History:  Diagnosis Date  . Asthma   . Back pain   . Leaky heart valve   . Migraine    Past Surgical History:  Procedure Laterality Date  . BREAST SURGERY     left breast abcess   History reviewed. No pertinent family history. Social History  Substance Use Topics  . Smoking status: Current Some Day Smoker  . Smokeless tobacco: Never Used  . Alcohol use Yes   OB History    No data available     Review of Systems  Constitutional: Positive for activity change. Negative for chills, fatigue and fever.  HENT: Positive for congestion.   Neurological: Positive for headaches. Negative for tremors, seizures, syncope, facial asymmetry, speech difficulty, weakness and numbness.  All other systems reviewed and are negative.   Allergies  Review of patient's allergies indicates no known allergies.  Home Medications   Prior to Admission medications   Medication Sig Start Date End Date Taking? Authorizing Provider  baclofen (LIORESAL) 10 MG tablet Take 1 tablet (10 mg  total) by mouth 4 (four) times daily. 11/02/15 11/01/16  Pierce Crane Beers, PA-C  cyclobenzaprine (FLEXERIL) 10 MG tablet Take 10 mg by mouth 3 (three) times daily as needed for muscle spasms.    Historical Provider, MD  HYDROcodone-acetaminophen (NORCO/VICODIN) 5-325 MG tablet Take 1 tablet by mouth every 4 (four) hours as needed for moderate pain. 03/02/16   Johnn Hai, PA-C  ibuprofen (ADVIL,MOTRIN) 800 MG tablet Take 800 mg by mouth every 8 (eight) hours as needed.    Historical Provider, MD  loratadine-pseudoephedrine (CLARITIN-D 24-HOUR) 10-240 MG 24 hr tablet Take 1 tablet by mouth daily.    Historical Provider, MD  meloxicam (MOBIC) 15 MG tablet Take 15 mg by mouth daily.    Historical Provider, MD  naproxen (NAPROSYN) 500 MG tablet Take 1 tablet (500 mg total) by mouth 2 (two) times daily with a meal. 05/04/16   Lorin Picket, PA-C  ondansetron (ZOFRAN) 4 MG tablet Take 1 tablet (4 mg total) by mouth every 8 (eight) hours as needed for nausea or vomiting. 02/26/16   Lattie Corns, PA-C  promethazine (PHENERGAN) 25 MG tablet Take 1 tablet (25 mg total) by mouth every 6 (six) hours as needed for nausea or vomiting. 02/26/16   Lattie Corns, PA-C   Meds Ordered and Administered this Visit   Medications  ketorolac (TORADOL) injection 60 mg (60 mg Intramuscular Given 05/04/16 1544)  ondansetron (ZOFRAN-ODT) disintegrating tablet 8 mg (8 mg Oral Given 05/04/16 1543)  BP 118/64 (BP Location: Left Arm)   Pulse 79   Temp 98.3 F (36.8 C) (Oral)   Resp 16   Ht 5\' 1"  (1.549 m)   Wt 156 lb (70.8 kg)   SpO2 97%   BMI 29.48 kg/m  No data found.   Physical Exam  Constitutional: She is oriented to person, place, and time. She appears well-developed and well-nourished. No distress.  HENT:  Head: Normocephalic and atraumatic.  Right Ear: External ear normal.  Left Ear: External ear normal.  Nose: Nose normal.  Mouth/Throat: Oropharynx is clear and moist. No oropharyngeal  exudate.  Eyes: EOM are normal. Pupils are equal, round, and reactive to light. Right eye exhibits no discharge. Left eye exhibits no discharge.  Neck: Normal range of motion. Neck supple.  Musculoskeletal: Normal range of motion. She exhibits no edema, tenderness or deformity.  Lymphadenopathy:    She has no cervical adenopathy.  Neurological: She is alert and oriented to person, place, and time. She displays normal reflexes. No cranial nerve deficit. She exhibits normal muscle tone. Coordination normal.  Skin: Skin is warm and dry. She is not diaphoretic.  Psychiatric: She has a normal mood and affect. Her behavior is normal. Judgment and thought content normal.  Nursing note and vitals reviewed.   Urgent Care Course   Clinical Course    Procedures (including critical care time)  Labs Review Labs Reviewed - No data to display  Imaging Review No results found.   Visual Acuity Review  Right Eye Distance:   Left Eye Distance:   Bilateral Distance:    Right Eye Near:   Left Eye Near:    Bilateral Near:     Medications  ketorolac (TORADOL) injection 60 mg (60 mg Intramuscular Given 05/04/16 1544)  ondansetron (ZOFRAN-ODT) disintegrating tablet 8 mg (8 mg Oral Given 05/04/16 1543)  Patient's pain level went from a 7-0 after the Toradol injection    MDM   1. Migraine without aura and without status migrainosus, not intractable    Discharge Medication List as of 05/04/2016  4:21 PM    START taking these medications   Details  naproxen (NAPROSYN) 500 MG tablet Take 1 tablet (500 mg total) by mouth 2 (two) times daily with a meal., Starting Thu 05/04/2016, Normal      Plan: 1. Test/x-ray results and diagnosis reviewed with patient 2. rx as per orders; risks, benefits, potential side effects reviewed with patient 3. Recommend supportive treatment with compresses/sleep break cycle. Follow-up with a primary care physician if she has recurrent headaches or if they worsen.  Patient states she has antinausea medicine at home but has run out of Naprosyn which I will refill. 4. F/u prn if symptoms worsen or don't improve     Lorin Picket, PA-C 05/04/16 1627

## 2016-05-04 NOTE — ED Triage Notes (Signed)
Patient c/o headache for the past 4 days. Patient c/o some N/V.

## 2016-12-22 ENCOUNTER — Encounter: Payer: Self-pay | Admitting: Emergency Medicine

## 2016-12-22 ENCOUNTER — Emergency Department
Admission: EM | Admit: 2016-12-22 | Discharge: 2016-12-22 | Disposition: A | Discharge status: Home / Self Care | Payer: No Typology Code available for payment source | Attending: Emergency Medicine | Admitting: Emergency Medicine

## 2016-12-22 DIAGNOSIS — J45909 Unspecified asthma, uncomplicated: Secondary | ICD-10-CM | POA: Insufficient documentation

## 2016-12-22 DIAGNOSIS — F17210 Nicotine dependence, cigarettes, uncomplicated: Secondary | ICD-10-CM | POA: Insufficient documentation

## 2016-12-22 DIAGNOSIS — B9789 Other viral agents as the cause of diseases classified elsewhere: Secondary | ICD-10-CM

## 2016-12-22 DIAGNOSIS — J028 Acute pharyngitis due to other specified organisms: Secondary | ICD-10-CM

## 2016-12-22 DIAGNOSIS — J069 Acute upper respiratory infection, unspecified: Secondary | ICD-10-CM | POA: Insufficient documentation

## 2016-12-22 DIAGNOSIS — Z79899 Other long term (current) drug therapy: Secondary | ICD-10-CM | POA: Insufficient documentation

## 2016-12-22 DIAGNOSIS — M5430 Sciatica, unspecified side: Secondary | ICD-10-CM

## 2016-12-22 MED ORDER — MAGIC MOUTHWASH W/LIDOCAINE: ORAL | 0 refills | Status: DC

## 2016-12-22 MED ORDER — METHYLPREDNISOLONE 4 MG PO TBPK: ORAL_TABLET | ORAL | 0 refills | Status: DC

## 2016-12-22 MED ORDER — PSEUDOEPH-BROMPHEN-DM 30-2-10 MG/5ML PO SYRP: 5.0000 mL | ORAL_SOLUTION | Freq: Four times a day (QID) | ORAL | 0 refills | Status: DC | PRN

## 2016-12-22 NOTE — ED Triage Notes (Signed)
C/O sore throat and body aches x 3 days.  Patient also has some diarrhea earlier this week, which has resolved.

## 2016-12-22 NOTE — ED Triage Notes (Signed)
ARrives with c/o sore throat, body aches, and cough x 2-3 days.

## 2016-12-22 NOTE — ED Provider Notes (Signed)
[facility] Emergency Department Provider Note   ____________________________________________   First MD Initiated Contact with Patient 12/22/16 1348     (approximate)  I have reviewed the triage vital signs and the nursing notes.   HISTORY  Chief Complaint Sore Throat    HPI Teresa Deleon is a 51 y.o. female patient complaining of sore throat and body for 3 days. Patient stated he had diarrhea earlier this week but has resolved. Patient is a congestion and nonproductive cough. Patient describes sore throat "scratchy". No palliative measures for complaint. Patient rated her pain as a 10 over 10.   Past Medical History:  Diagnosis Date  . Asthma   . Back pain   . Leaky heart valve   . Migraine     There are no active problems to display for this patient.   Past Surgical History:  Procedure Laterality Date  . BREAST SURGERY     left breast abcess    Prior to Admission medications   Medication Sig Start Date End Date Taking? Authorizing Provider  brompheniramine-pseudoephedrine-DM 30-2-10 MG/5ML syrup Take 5 mLs by mouth 4 (four) times daily as needed. 12/22/16   Sable Feil, PA-C  cyclobenzaprine (FLEXERIL) 10 MG tablet Take 10 mg by mouth 3 (three) times daily as needed for muscle spasms.    [provider]  HYDROcodone-acetaminophen (NORCO/VICODIN) 5-325 MG tablet Take 1 tablet by mouth every 4 (four) hours as needed for moderate pain. 03/02/16   Johnn Hai, PA-C  ibuprofen (ADVIL,MOTRIN) 800 MG tablet Take 800 mg by mouth every 8 (eight) hours as needed.    [provider]  loratadine-pseudoephedrine (CLARITIN-D 24-HOUR) 10-240 MG 24 hr tablet Take 1 tablet by mouth daily.    [provider]  magic mouthwash w/lidocaine SOLN 30/30/30/64ml of each 12/22/16   Sable Feil, PA-C  meloxicam (MOBIC) 15 MG tablet Take 15 mg by mouth daily.    [provider]  methylPREDNISolone (MEDROL DOSEPAK) 4 MG  TBPK tablet Take Tapered dose as directed 12/22/16   Sable Feil, PA-C  naproxen (NAPROSYN) 500 MG tablet Take 1 tablet (500 mg total) by mouth 2 (two) times daily with a meal. 05/04/16   Lorin Picket, PA-C  ondansetron (ZOFRAN) 4 MG tablet Take 1 tablet (4 mg total) by mouth every 8 (eight) hours as needed for nausea or vomiting. 02/26/16   Lattie Corns, PA-C  promethazine (PHENERGAN) 25 MG tablet Take 1 tablet (25 mg total) by mouth every 6 (six) hours as needed for nausea or vomiting. 02/26/16   Lattie Corns, PA-C    Allergies Patient has no known allergies.  No family history on file.  Social History Social History  Substance Use Topics  . Smoking status: Current Some Day Smoker    Packs/day: 0.50    Types: Cigarettes  . Smokeless tobacco: Never Used  . Alcohol use Yes    Review of Systems  Constitutional: Body in Eyes: No visual changes. ENT: Sore throat and nasal congestion Cardiovascular: Denies chest pain. Respiratory: Denies shortness of breath. Nonproductive cough Gastrointestinal: No abdominal pain.  No nausea, no vomiting.  No diarrhea.  No constipation. Genitourinary: Negative for dysuria. Musculoskeletal: Negative for back pain. Skin: Negative for rash. Neurological: Negative for headaches, focal weakness or numbness.   ____________________________________________   PHYSICAL EXAM:  VITAL SIGNS: ED Triage Vitals [12/22/16 1209]  Enc Vitals Group     BP 122/73     Pulse  Resp 16     Temp 98.5 F (36.9 C)     Temp Source Oral     SpO2 97 %     Weight 156 lb (70.8 kg)     Height 5\' 3"  (1.6 m)     Head Circumference      Peak Flow      Pain Score 7     Pain Loc      Pain Edu?      Excl. in [place]?     Constitutional: Alert and oriented. Well appearing and in no acute distress. Eyes: Conjunctivae are normal. PERRL. EOMI. Head: Atraumatic. Nose: Bilateral maxillary guarding and edematous nasal turbinates . Mouth/Throat: Mucous  membranes are moist.  Oropharynx non-erythematous. Postnasal drainage Neck: No stridor.  No cervical spine tenderness to palpation. Hematological/Lymphatic/Immunilogical: No cervical lymphadenopathy. Cardiovascular: Normal rate, regular rhythm. Grossly normal heart sounds.  Good peripheral circulation. Respiratory: Normal respiratory effort.  No retractions. Lungs CTAB. Gastrointestinal: Soft and nontender. No distention. No abdominal bruits. No CVA tenderness. Musculoskeletal: No lower extremity tenderness nor edema.  No joint effusions. Neurologic:  Normal speech and language. No gross focal neurologic deficits are appreciated. No gait instability. Skin:  Skin is warm, dry and intact. No rash noted. Psychiatric: Mood and affect are normal. Speech and behavior are normal.  ____________________________________________   LABS (all labs ordered are listed, but only abnormal results are displayed)  Labs Reviewed - No data to display ____________________________________________  EKG   ____________________________________________  RADIOLOGY   ____________________________________________   PROCEDURES  Procedure(s) performed: None  Procedures  Critical Care performed: No  ____________________________________________   INITIAL IMPRESSION / ASSESSMENT AND PLAN / ED COURSE  Pertinent labs & imaging results that were available during my care of the patient were reviewed by me and considered in my medical decision making (see chart for details).  Viral pharyngitis with upper rest or infection. Patient also has sciatica  leg pain. Patient given discharge care instructions and advised follow-up PCP if condition persists.      ____________________________________________   FINAL CLINICAL IMPRESSION(S) / ED DIAGNOSES  Final diagnoses:  Sore throat (viral)  Viral URI with cough  Sciatic leg pain      NEW MEDICATIONS STARTED DURING THIS VISIT:  New Prescriptions    BROMPHENIRAMINE-PSEUDOEPHEDRINE-DM 30-2-10 MG/5ML SYRUP    Take 5 mLs by mouth 4 (four) times daily as needed.   MAGIC MOUTHWASH W/LIDOCAINE SOLN    30/30/30/78ml of each   METHYLPREDNISOLONE (MEDROL DOSEPAK) 4 MG TBPK TABLET    Take Tapered dose as directed     Note:  This document was prepared using Dragon voice recognition software and may include unintentional dictation errors.    Sable Feil, PA-C 12/22/16 [place], [place], MD 12/26/16 [phone]

## 2017-02-15 ENCOUNTER — Encounter: Payer: Self-pay | Admitting: Emergency Medicine

## 2017-02-15 ENCOUNTER — Ambulatory Visit (INDEPENDENT_AMBULATORY_CARE_PROVIDER_SITE_OTHER): Payer: Self-pay

## 2017-02-15 ENCOUNTER — Ambulatory Visit
Admission: EM | Admit: 2017-02-15 | Discharge: 2017-02-15 | Disposition: A | Discharge status: Home / Self Care | Payer: No Typology Code available for payment source | Attending: Family Medicine | Admitting: Family Medicine

## 2017-02-15 DIAGNOSIS — M5441 Lumbago with sciatica, right side: Secondary | ICD-10-CM

## 2017-02-15 DIAGNOSIS — M62830 Muscle spasm of back: Secondary | ICD-10-CM

## 2017-02-15 DIAGNOSIS — W19XXXA Unspecified fall, initial encounter: Secondary | ICD-10-CM

## 2017-02-15 MED ORDER — MELOXICAM 15 MG PO TABS: 15.0000 mg | ORAL_TABLET | Freq: Every day | ORAL | 0 refills | Status: DC

## 2017-02-15 MED ORDER — KETOROLAC TROMETHAMINE 60 MG/2ML IM SOLN
60.0000 mg | Freq: Once | INTRAMUSCULAR | Status: AC
  Administered 2017-02-15: 60 mg via INTRAMUSCULAR

## 2017-02-15 MED ORDER — ORPHENADRINE CITRATE ER 100 MG PO TB12: 100.0000 mg | ORAL_TABLET | Freq: Two times a day (BID) | ORAL | 0 refills | Status: DC
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
## 2017-02-15 NOTE — ED Triage Notes (Signed)
Patient c/o lower back pain and stomach pain that started Tuesday night.  Patient states that she fell on Tuesday night.  Patient denies hitting her head and no LOC.

## 2017-02-15 NOTE — ED Notes (Signed)
Patient shows no signs of adverse reaction to medication at this time.  

## 2018-02-06 ENCOUNTER — Other Ambulatory Visit: Payer: Self-pay

## 2018-02-06 ENCOUNTER — Emergency Department: Payer: Commercial Managed Care - HMO

## 2018-02-06 ENCOUNTER — Ambulatory Visit
Admission: EM | Admit: 2018-02-06 | Discharge: 2018-02-06 | Disposition: A | Discharge status: Nursing Home (Includes ICF) | Payer: 59 | Source: Home / Self Care | Attending: Family Medicine | Admitting: Family Medicine

## 2018-02-06 ENCOUNTER — Encounter: Payer: Self-pay | Admitting: Emergency Medicine

## 2018-02-06 ENCOUNTER — Emergency Department
Admission: EM | Admit: 2018-02-06 | Discharge: 2018-02-06 | Disposition: A | Discharge status: Home / Self Care | Payer: Commercial Managed Care - HMO | Attending: Emergency Medicine | Admitting: Emergency Medicine

## 2018-02-06 DIAGNOSIS — Z79899 Other long term (current) drug therapy: Secondary | ICD-10-CM | POA: Diagnosis not present

## 2018-02-06 DIAGNOSIS — R110 Nausea: Secondary | ICD-10-CM

## 2018-02-06 DIAGNOSIS — R1031 Right lower quadrant pain: Secondary | ICD-10-CM | POA: Diagnosis not present

## 2018-02-06 DIAGNOSIS — F17210 Nicotine dependence, cigarettes, uncomplicated: Secondary | ICD-10-CM | POA: Diagnosis not present

## 2018-02-06 DIAGNOSIS — R109 Unspecified abdominal pain: Secondary | ICD-10-CM | POA: Diagnosis not present

## 2018-02-06 DIAGNOSIS — J45909 Unspecified asthma, uncomplicated: Secondary | ICD-10-CM | POA: Diagnosis not present

## 2018-02-06 LAB — URINALYSIS, COMPLETE (UACMP) WITH MICROSCOPIC
Bacteria, UA: NONE SEEN
Bilirubin Urine: NEGATIVE
Bilirubin Urine: NEGATIVE
Glucose, UA: NEGATIVE mg/dL
Glucose, UA: NEGATIVE mg/dL
Hgb urine dipstick: NEGATIVE
Hgb urine dipstick: NEGATIVE
Ketones, ur: NEGATIVE mg/dL
Ketones, ur: NEGATIVE mg/dL
Leukocytes, UA: NEGATIVE
Leukocytes, UA: NEGATIVE
Nitrite: NEGATIVE
Nitrite: NEGATIVE
Protein, ur: NEGATIVE mg/dL
Protein, ur: NEGATIVE mg/dL
Specific Gravity, Urine: 1.02 (ref 1.005–1.030)
Specific Gravity, Urine: 1.003 — ABNORMAL LOW (ref 1.005–1.030)
Squamous Epithelial / LPF: NONE SEEN (ref 0–5)
pH: 6 (ref 5.0–8.0)
pH: 6 (ref 5.0–8.0)

## 2018-02-06 LAB — COMPREHENSIVE METABOLIC PANEL
ALT: 41 U/L (ref 0–44)
AST: 40 U/L (ref 15–41)
Albumin: 4.5 g/dL (ref 3.5–5.0)
Alkaline Phosphatase: 98 U/L (ref 38–126)
Anion gap: 9 (ref 5–15)
BUN: 12 mg/dL (ref 6–20)
CO2: 26 mmol/L (ref 22–32)
Calcium: 9.6 mg/dL (ref 8.9–10.3)
Chloride: 105 mmol/L (ref 98–111)
Creatinine, Ser: 0.72 mg/dL (ref 0.44–1.00)
GFR calc Af Amer: >60 mL/min (ref >60)
GFR calc non Af Amer: >60 mL/min (ref >60)
Glucose, Bld: 109 mg/dL — ABNORMAL HIGH (ref 70–99)
Potassium: 4 mmol/L (ref 3.5–5.1)
Sodium: 140 mmol/L (ref 135–145)
Total Bilirubin: 0.5 mg/dL (ref 0.3–1.2)
Total Protein: 7.8 g/dL (ref 6.5–8.1)

## 2018-02-06 LAB — CBC
HCT: 41.5 % (ref 35.0–47.0)
Hemoglobin: 14.2 g/dL (ref 12.0–16.0)
MCH: 31.3 pg (ref 26.0–34.0)
MCHC: 34.3 g/dL (ref 32.0–36.0)
MCV: 91.4 fL (ref 80.0–100.0)
Platelets: 350 10*3/uL (ref 150–440)
RBC: 4.54 MIL/uL (ref 3.80–5.20)
RDW: 13.4 % (ref 11.5–14.5)
WBC: 8.8 10*3/uL (ref 3.6–11.0)

## 2018-02-06 LAB — PREGNANCY, URINE: Preg Test, Ur: NEGATIVE

## 2018-02-06 LAB — LIPASE, BLOOD: Lipase: 28 U/L (ref 11–51)

## 2018-02-06 MED ORDER — KETOROLAC TROMETHAMINE 30 MG/ML IJ SOLN
INTRAMUSCULAR | Status: AC
  Filled 2018-02-06: qty 1

## 2018-02-06 MED ORDER — IOHEXOL 300 MG/ML  SOLN
100.0000 mL | Freq: Once | INTRAMUSCULAR | Status: AC | PRN
  Administered 2018-02-06: 100 mL via INTRAVENOUS
  Filled 2018-02-06: qty 100

## 2018-02-06 MED ORDER — IOPAMIDOL (ISOVUE-300) INJECTION 61%
30.0000 mL | Freq: Once | INTRAVENOUS | Status: AC | PRN
  Administered 2018-02-06: 30 mL via ORAL
  Filled 2018-02-06: qty 30

## 2018-02-06 MED ORDER — MORPHINE SULFATE (PF) 4 MG/ML IV SOLN
4.0000 mg | Freq: Once | INTRAVENOUS | Status: AC
  Administered 2018-02-06: 4 mg via INTRAVENOUS
  Filled 2018-02-06: qty 1

## 2018-02-06 MED ORDER — OXYCODONE-ACETAMINOPHEN 5-325 MG PO TABS
2.0000 | ORAL_TABLET | Freq: Once | ORAL | Status: AC
  Administered 2018-02-06: 2 via ORAL

## 2018-02-06 MED ORDER — KETOROLAC TROMETHAMINE 30 MG/ML IJ SOLN
15.0000 mg | Freq: Once | INTRAMUSCULAR | Status: AC
  Administered 2018-02-06: 15 mg via INTRAVENOUS

## 2018-02-06 MED ORDER — OXYCODONE-ACETAMINOPHEN 5-325 MG PO TABS
ORAL_TABLET | ORAL | Status: AC
  Filled 2018-02-06: qty 2

## 2018-02-06 MED ORDER — ONDANSETRON HCL 4 MG/2ML IJ SOLN
4.0000 mg | INTRAMUSCULAR | Status: AC
  Administered 2018-02-06: 4 mg via INTRAVENOUS
  Filled 2018-02-06: qty 2

## 2018-02-06 NOTE — ED Notes (Signed)
Patient has h/o ovarian cysts in the past. States she still has an appendix as well and is concerned that she may have appendicitis.  No tenderness noted on palpation.

## 2018-02-06 NOTE — Discharge Instructions (Signed)
Recommend patient go to Emergency Department for further evaluation/management 

## 2018-02-06 NOTE — ED Provider Notes (Signed)
[facility] Emergency Department Provider Note  ____________________________________________   First MD Initiated Contact with Patient 02/06/18 1934     (approximate)  I have reviewed the triage vital signs and the nursing notes.   HISTORY  Chief Complaint Abdominal Pain    HPI Teresa Deleon is a 52 y.o. female who presents for evaluation of acute onset and gradually worsening right lower quadrant abdominal pain that started last night and has been constant since that time.  She has had some mild nausea but no vomiting.  She denies fever/chills, diarrhea, constipation, dysuria, hematuria, chest pain, shortness of breath.  She has not had symptoms similar to this in the past.  Moving around makes it worse and holding still makes it better.  She still has her appendix.  She has no pain in her upper abdomen.  She is wondered about gallbladder disease in the past but she has no upper abdominal pain and eating and drinking does not seem to make the symptoms worse today although she has had less appetite today than usual.  She was seen in the urgent care and recommended to come to the emergency department for further evaluation.  Past Medical History:  Diagnosis Date  . Asthma   . Back pain   . Leaky heart valve   . Migraine     There are no active problems to display for this patient.   Past Surgical History:  Procedure Laterality Date  . BREAST SURGERY     left breast abcess    Prior to Admission medications   Medication Sig Start Date End Date Taking? Authorizing Provider  brompheniramine-pseudoephedrine-DM 30-2-10 MG/5ML syrup Take 5 mLs by mouth 4 (four) times daily as needed. 12/22/16   Sable Feil, PA-C  cyclobenzaprine (FLEXERIL) 10 MG tablet Take 10 mg by mouth 3 (three) times daily as needed for muscle spasms.    [provider]  HYDROcodone-acetaminophen (NORCO/VICODIN) 5-325 MG tablet Take 1 tablet by mouth every 4 (four) hours as  needed for moderate pain. 03/02/16   Johnn Hai, PA-C  ibuprofen (ADVIL,MOTRIN) 800 MG tablet Take 800 mg by mouth every 8 (eight) hours as needed.    [provider]  loratadine-pseudoephedrine (CLARITIN-D 24-HOUR) 10-240 MG 24 hr tablet Take 1 tablet by mouth daily.    [provider]  magic mouthwash w/lidocaine SOLN 30/30/30/65ml of each 12/22/16   Sable Feil, PA-C  meloxicam (MOBIC) 15 MG tablet Take 15 mg by mouth daily.    [provider]  meloxicam (MOBIC) 15 MG tablet Take 1 tablet (15 mg total) by mouth daily. 02/15/17   Frederich Cha, MD  methylPREDNISolone (MEDROL DOSEPAK) 4 MG TBPK tablet Take Tapered dose as directed 12/22/16   Sable Feil, PA-C  naproxen (NAPROSYN) 500 MG tablet Take 1 tablet (500 mg total) by mouth 2 (two) times daily with a meal. 05/04/16   Lorin Picket, PA-C  ondansetron (ZOFRAN) 4 MG tablet Take 1 tablet (4 mg total) by mouth every 8 (eight) hours as needed for nausea or vomiting. 02/26/16   Lattie Corns, PA-C  orphenadrine (NORFLEX) 100 MG tablet Take 1 tablet (100 mg total) by mouth 2 (two) times daily. 02/15/17   Frederich Cha, MD  promethazine (PHENERGAN) 25 MG tablet Take 1 tablet (25 mg total) by mouth every 6 (six) hours as needed for nausea or vomiting. 02/26/16   Lattie Corns, PA-C    Allergies Patient has no known allergies.  No family history on file.  Social History Social History   Tobacco Use  . Smoking status: Current Some Day Smoker    Packs/day: 0.50    Types: Cigarettes  . Smokeless tobacco: Never Used  Substance Use Topics  . Alcohol use: Yes  . Drug use: No    Review of Systems Constitutional: No fever/chills Eyes: No visual changes. ENT: No sore throat. Cardiovascular: Denies chest pain. Respiratory: Denies shortness of breath. Gastrointestinal: Right lower quadrant abdominal pain with nausea as described above Genitourinary: Negative for dysuria. Musculoskeletal: Negative  for neck pain.  Negative for back pain. Integumentary: Negative for rash. Neurological: Negative for headaches, focal weakness or numbness.   ____________________________________________   PHYSICAL EXAM:  VITAL SIGNS: ED Triage Vitals  Enc Vitals Group     BP 02/06/18 1738 140/76     Pulse Rate 02/06/18 1738 84     Resp 02/06/18 1738 18     Temp 02/06/18 1738 98.4 F (36.9 C)     Temp Source 02/06/18 1738 Oral     SpO2 02/06/18 1738 98 %     Weight 02/06/18 1739 70.8 kg (156 lb)     Height 02/06/18 1739 1.549 m (5\' 1" )     Head Circumference --      Peak Flow --      Pain Score 02/06/18 1739 9     Pain Loc --      Pain Edu? --      Excl. in [place]? --     Constitutional: Alert and oriented. Well appearing and in no acute distress. Eyes: Conjunctivae are normal.  Head: Atraumatic. Nose: No congestion/rhinnorhea. Mouth/Throat: Mucous membranes are moist. Neck: No stridor.  No meningeal signs.   Cardiovascular: Normal rate, regular rhythm. Good peripheral circulation. Grossly normal heart sounds. Respiratory: Normal respiratory effort.  No retractions. Lungs CTAB. Gastrointestinal: Soft and nondistended.  Severe tenderness to palpation of the right lower quadrant with some rebound and guarding.  No upper abdominal tenderness including negative Murphy sign and no tenderness to palpation of the epigastrium. Musculoskeletal: No lower extremity tenderness nor edema. No gross deformities of extremities. Neurologic:  Normal speech and language. No gross focal neurologic deficits are appreciated.  Skin:  Skin is warm, dry and intact. No rash noted. Psychiatric: Mood and affect are normal. Speech and behavior are normal.  ____________________________________________   LABS (all labs ordered are listed, but only abnormal results are displayed)  Labs Reviewed  COMPREHENSIVE METABOLIC PANEL - Abnormal; Notable for the following components:      Result Value   Glucose, Bld 109 (*)      All other components within normal limits  URINALYSIS, COMPLETE (UACMP) WITH MICROSCOPIC - Abnormal; Notable for the following components:   Color, Urine COLORLESS (*)    APPearance CLEAR (*)    Specific Gravity, Urine 1.003 (*)    All other components within normal limits  LIPASE, BLOOD  CBC   ____________________________________________  EKG  None - EKG not ordered by ED physician ____________________________________________  RADIOLOGY   ED MD interpretation: No acute abnormalities in the abdomen or pelvis.  Stable adenoma-like lesion since 2014.  Official radiology report(s): Ct Abdomen Pelvis W Contrast  Result Date: 02/06/2018 CLINICAL DATA:  Right lower quadrant abdominal pain since last evening. EXAM: CT ABDOMEN AND PELVIS WITH CONTRAST TECHNIQUE: Multidetector CT imaging of the abdomen and pelvis was performed using the standard protocol following bolus administration of intravenous contrast. CONTRAST:  115mL OMNIPAQUE IOHEXOL 300 MG/ML  SOLN  COMPARISON:  03/05/2013 CT FINDINGS: Lower chest: Normal size heart.  No active pulmonary disease. Hepatobiliary: Diffuse hepatic steatosis. Physiologic distention of the gallbladder without stones. Focal fatty sparing at the gallbladder fossa. No biliary dilatation is identified. Pancreas: Normal Spleen: Normal Adrenals/Urinary Tract: Stable 13 mm short axis right hypodense adrenal lesion unchanged since 2014 consistent with a benign finding likely an adenoma. The left adrenal gland is unremarkable. There is symmetric cortical enhancement of both kidneys without significant cortical thinning, enhancing renal mass, nephrolithiasis nor hydroureteronephrosis. The urinary bladder is unremarkable for the degree of distention. Stomach/Bowel: Stomach is within normal limits. Appendix appears normal. No evidence of bowel wall thickening, distention, or inflammatory changes. Vascular/Lymphatic: Aortic atherosclerosis. No enlarged abdominal or pelvic  lymph nodes. Reproductive: Slightly bulky heterogeneous appearing uterus that may represent subtle fibroids. No adnexal mass. Other: No abdominal wall hernia or abnormality. No abdominopelvic ascites. Musculoskeletal: No acute or significant osseous findings. IMPRESSION: 1. Normal-appearing appendix. No acute bowel obstruction or inflammation. 2. Hepatic steatosis. 3. Stable 13 mm short axis right adrenal nodule consistent with a benign adenoma. 4. Bulky appearing uterus that may reflect diffuse fibroids. No discretely outlined mass is identified. Electronically Signed   By: Ashley Royalty M.D.   On: 02/06/2018 21:13    ____________________________________________   PROCEDURES  Critical Care performed: No   Procedure(s) performed:   Procedures   ____________________________________________   INITIAL IMPRESSION / ASSESSMENT AND PLAN / ED COURSE  As part of my medical decision making, I reviewed the following data within the [place] notes reviewed and incorporated, Labs reviewed , Old chart reviewed and Notes from prior ED visits    Differential diagnosis includes, but is not limited to, diverticulitis, appendicitis, nonspecific colitis, gallbladder disease, musculoskeletal strain, ovarian cyst/torsion.  The history of present illness suggests a process such as diverticulitis or appendicitis, much more likely than ovarian cause.  She has no tenderness in the upper abdomen and I strongly doubt gallbladder disease in this particular case.  She has no leukocytosis, normal conference of metabolic panel, normal urinalysis, and negative lipase.  All this is reassuring but does not rule out acute infection.  On physical exam she is extremely tender to palpation in the appropriate area either for diverticulitis or appendicitis.  She is not having any trouble tolerating oral fluids.  I will evaluate with a CT scan of the abdomen and pelvis with oral and IV contrast as per  protocol.  I am giving morphine 4 mg IV and Zofran 4 mg IV and will reassess after the imaging.  She understands and agrees with the plan.  Clinical Course as of Feb 07 2136  Wed Feb 06, 2018  2134 CT scan shows no acute cause of the patient's pain.  In the absence of any acute lab abnormalities that are abnormal in any acute findings on the CT scan, most likely she is suffering from musculoskeletal pain.  I updated her and her family about the results and encourage the use of over-the-counter ibuprofen and Tylenol and close outpatient follow-up.  She does in fact have a primary care provider and she will call to schedule an appointment.  I gave my usual and customary return precautions.  CT ABDOMEN PELVIS W CONTRAST [CF]  2136 For the moderate pain, I ordered Toradol 15 mg IV and Percocet x 2 prior to discharge.   [CF]    Clinical Course User Index [CF] Hinda Kehr, MD    ____________________________________________  FINAL CLINICAL IMPRESSION(S) /  ED DIAGNOSES  Final diagnoses:  Right sided abdominal pain  Right flank pain     MEDICATIONS GIVEN DURING THIS VISIT:  Medications  ketorolac (TORADOL) 30 MG/ML injection 15 mg (has no administration in time range)  oxyCODONE-acetaminophen (PERCOCET/ROXICET) 5-325 MG per tablet 2 tablet (has no administration in time range)  morphine 4 MG/ML injection 4 mg (4 mg Intravenous Given 02/06/18 2000)  ondansetron (ZOFRAN) injection 4 mg (4 mg Intravenous Given 02/06/18 2001)  iopamidol (ISOVUE-300) 61 % injection 30 mL (30 mLs Oral Contrast Given 02/06/18 2002)  iohexol (OMNIPAQUE) 300 MG/ML solution 100 mL (100 mLs Intravenous Contrast Given 02/06/18 2049)     ED Discharge Orders    None       Note:  This document was prepared using Dragon voice recognition software and may include unintentional dictation errors.    Hinda Kehr, MD 02/06/18 2137

## 2018-02-06 NOTE — ED Triage Notes (Signed)
Pt presents to ED with c/o RLQ abdominal pain. Pt states the pain started either last night or 3 days ago. Pt denies N/V/D or fevers at home, states was referred by UC due to where pain is, pt endorses still having her appendix at this time.

## 2018-02-06 NOTE — Discharge Instructions (Addendum)
You have been seen in the Emergency Department (ED) for abdominal and flank pain.  Your evaluation did not identify a clear cause of your symptoms but was generally reassuring.  Please follow up as instructed above regarding today?s emergent visit and the symptoms that are bothering you.  Return to the ED if your abdominal pain worsens or fails to improve, you develop bloody vomiting, bloody diarrhea, you are unable to tolerate fluids due to vomiting, fever greater than 101, or other symptoms that concern you.

## 2018-02-06 NOTE — ED Provider Notes (Signed)
[facility]    CSN: [id number] Arrival date & time: 02/06/18  1544     History   Chief Complaint Chief Complaint  Patient presents with  . Abdominal Pain    HPI Teresa Deleon is a 52 y.o. female.   52 yo female with a c/o right lower abdominal pain since last night associated with mild nausea but no vomiting, fevers, chills, diarrhea, constipation, dysuria, hematuria. Pain is 8/10, constant and "sharp".  The history is provided by the patient.  Abdominal Pain    Past Medical History:  Diagnosis Date  . Asthma   . Back pain   . Leaky heart valve   . Migraine     There are no active problems to display for this patient.   Past Surgical History:  Procedure Laterality Date  . BREAST SURGERY     left breast abcess    OB History   None      Home Medications    Prior to Admission medications   Medication Sig Start Date End Date Taking? Authorizing Provider  brompheniramine-pseudoephedrine-DM 30-2-10 MG/5ML syrup Take 5 mLs by mouth 4 (four) times daily as needed. 12/22/16   Sable Feil, PA-C  cyclobenzaprine (FLEXERIL) 10 MG tablet Take 10 mg by mouth 3 (three) times daily as needed for muscle spasms.    [provider]  HYDROcodone-acetaminophen (NORCO/VICODIN) 5-325 MG tablet Take 1 tablet by mouth every 4 (four) hours as needed for moderate pain. 03/02/16   Johnn Hai, PA-C  ibuprofen (ADVIL,MOTRIN) 800 MG tablet Take 800 mg by mouth every 8 (eight) hours as needed.    [provider]  loratadine-pseudoephedrine (CLARITIN-D 24-HOUR) 10-240 MG 24 hr tablet Take 1 tablet by mouth daily.    [provider]  magic mouthwash w/lidocaine SOLN 30/30/30/52ml of each 12/22/16   Sable Feil, PA-C  meloxicam (MOBIC) 15 MG tablet Take 15 mg by mouth daily.    [provider]  meloxicam (MOBIC) 15 MG tablet Take 1 tablet (15 mg total) by mouth daily. 02/15/17   Frederich Cha, MD  methylPREDNISolone (MEDROL DOSEPAK) 4  MG TBPK tablet Take Tapered dose as directed 12/22/16   Sable Feil, PA-C  naproxen (NAPROSYN) 500 MG tablet Take 1 tablet (500 mg total) by mouth 2 (two) times daily with a meal. 05/04/16   Lorin Picket, PA-C  ondansetron (ZOFRAN) 4 MG tablet Take 1 tablet (4 mg total) by mouth every 8 (eight) hours as needed for nausea or vomiting. 02/26/16   Lattie Corns, PA-C  orphenadrine (NORFLEX) 100 MG tablet Take 1 tablet (100 mg total) by mouth 2 (two) times daily. 02/15/17   Frederich Cha, MD  promethazine (PHENERGAN) 25 MG tablet Take 1 tablet (25 mg total) by mouth every 6 (six) hours as needed for nausea or vomiting. 02/26/16   Lattie Corns, PA-C    Family History No family history on file.  Social History Social History   Tobacco Use  . Smoking status: Current Some Day Smoker    Packs/day: 0.50    Types: Cigarettes  . Smokeless tobacco: Never Used  Substance Use Topics  . Alcohol use: Yes  . Drug use: No     Allergies   Patient has no known allergies.   Review of Systems Review of Systems  Gastrointestinal: Positive for abdominal pain.     Physical Exam Triage Vital Signs ED Triage Vitals  Enc Vitals Group     BP 02/06/18 1553 Marland Kitchen)  146/84     Pulse Rate 02/06/18 1553 88     Resp 02/06/18 1553 18     Temp 02/06/18 1553 98.3 F (36.8 C)     Temp Source 02/06/18 1553 Oral     SpO2 02/06/18 1553 99 %     Weight --      Height --      Head Circumference --      Peak Flow --      Pain Score 02/06/18 1556 8     Pain Loc --      Pain Edu? --      Excl. in [place]? --    No data found.  Updated Vital Signs BP (!) 146/84 (BP Location: Left Arm)   Pulse 88   Temp 98.3 F (36.8 C) (Oral)   Resp 18   SpO2 99%   Visual Acuity Right Eye Distance:   Left Eye Distance:   Bilateral Distance:    Right Eye Near:   Left Eye Near:    Bilateral Near:     Physical Exam  Constitutional: She appears well-developed and well-nourished. No distress.  Abdominal:  Soft. Bowel sounds are normal. She exhibits no distension and no mass. There is tenderness (right lower quadrant (McBurney's point)). There is no rebound and no guarding.  Skin: She is not diaphoretic.  Nursing note and vitals reviewed.    UC Treatments / Results  Labs (all labs ordered are listed, but only abnormal results are displayed) Labs Reviewed  URINALYSIS, COMPLETE (UACMP) WITH MICROSCOPIC - Abnormal; Notable for the following components:      Result Value   Bacteria, UA RARE (*)    All other components within normal limits  PREGNANCY, URINE    EKG None  Radiology No results found.  Procedures Procedures (including critical care time)  Medications Ordered in UC Medications - No data to display  Initial Impression / Assessment and Plan / UC Course  I have reviewed the triage vital signs and the nursing notes.  Pertinent labs & imaging results that were available during my care of the patient were reviewed by me and considered in my medical decision making (see chart for details).      Final Clinical Impressions(s) / UC Diagnoses   Final diagnoses:  Abdominal pain, acute, right lower quadrant  (suspicious for possible early appendicitis)   Discharge Instructions     Recommend patient go to Emergency Department for further evaluation/management    ED Prescriptions    None      1. Lab results and diagnosis reviewed with patient; recommend patient go to Emergency Department for further evaluation and management   Controlled Substance Prescriptions [place] Controlled Substance Registry consulted? Not Applicable   Norval Gable, MD 02/06/18 2003

## 2018-02-06 NOTE — ED Triage Notes (Signed)
Pt here for abdominal pain in her RLQ since last night and no constipation, diarrhea or any other symptoms reported. States it feels like a sharp shooting pain that comes and goes. Did take tylenol otc without relief.

## 2018-04-04 ENCOUNTER — Ambulatory Visit (INDEPENDENT_AMBULATORY_CARE_PROVIDER_SITE_OTHER): Payer: 59 | Admitting: Internal Medicine

## 2018-04-04 ENCOUNTER — Encounter: Payer: Self-pay | Admitting: Internal Medicine

## 2018-04-04 VITALS — BP 138/78 | HR 82 | Temp 97.9°F | Ht 60.0 in | Wt 156.2 lb

## 2018-04-04 DIAGNOSIS — G8929 Other chronic pain: Secondary | ICD-10-CM | POA: Insufficient documentation

## 2018-04-04 DIAGNOSIS — Z0000 Encounter for general adult medical examination without abnormal findings: Secondary | ICD-10-CM

## 2018-04-04 DIAGNOSIS — Z13818 Encounter for screening for other digestive system disorders: Secondary | ICD-10-CM

## 2018-04-04 DIAGNOSIS — Z124 Encounter for screening for malignant neoplasm of cervix: Secondary | ICD-10-CM

## 2018-04-04 DIAGNOSIS — M5441 Lumbago with sciatica, right side: Secondary | ICD-10-CM

## 2018-04-04 DIAGNOSIS — R9389 Abnormal findings on diagnostic imaging of other specified body structures: Secondary | ICD-10-CM

## 2018-04-04 DIAGNOSIS — Z1389 Encounter for screening for other disorder: Secondary | ICD-10-CM

## 2018-04-04 DIAGNOSIS — N888 Other specified noninflammatory disorders of cervix uteri: Secondary | ICD-10-CM

## 2018-04-04 DIAGNOSIS — E559 Vitamin D deficiency, unspecified: Secondary | ICD-10-CM

## 2018-04-04 DIAGNOSIS — R7303 Prediabetes: Secondary | ICD-10-CM

## 2018-04-04 DIAGNOSIS — D219 Benign neoplasm of connective and other soft tissue, unspecified: Secondary | ICD-10-CM | POA: Diagnosis not present

## 2018-04-04 DIAGNOSIS — K219 Gastro-esophageal reflux disease without esophagitis: Secondary | ICD-10-CM | POA: Insufficient documentation

## 2018-04-04 DIAGNOSIS — J309 Allergic rhinitis, unspecified: Secondary | ICD-10-CM

## 2018-04-04 DIAGNOSIS — Z720 Tobacco use: Secondary | ICD-10-CM

## 2018-04-04 DIAGNOSIS — Z13220 Encounter for screening for lipoid disorders: Secondary | ICD-10-CM

## 2018-04-04 DIAGNOSIS — Z1159 Encounter for screening for other viral diseases: Secondary | ICD-10-CM

## 2018-04-04 DIAGNOSIS — Z20828 Contact with and (suspected) exposure to other viral communicable diseases: Secondary | ICD-10-CM

## 2018-04-04 DIAGNOSIS — G43909 Migraine, unspecified, not intractable, without status migrainosus: Secondary | ICD-10-CM | POA: Insufficient documentation

## 2018-04-04 DIAGNOSIS — Z0184 Encounter for antibody response examination: Secondary | ICD-10-CM

## 2018-04-04 DIAGNOSIS — M5442 Lumbago with sciatica, left side: Secondary | ICD-10-CM

## 2018-04-04 DIAGNOSIS — R011 Cardiac murmur, unspecified: Secondary | ICD-10-CM

## 2018-04-04 DIAGNOSIS — Z23 Encounter for immunization: Secondary | ICD-10-CM

## 2018-04-04 DIAGNOSIS — J329 Chronic sinusitis, unspecified: Secondary | ICD-10-CM | POA: Insufficient documentation

## 2018-04-04 DIAGNOSIS — Z20822 Contact with and (suspected) exposure to covid-19: Secondary | ICD-10-CM

## 2018-04-04 DIAGNOSIS — Z1329 Encounter for screening for other suspected endocrine disorder: Secondary | ICD-10-CM

## 2018-04-04 DIAGNOSIS — Z1231 Encounter for screening mammogram for malignant neoplasm of breast: Secondary | ICD-10-CM

## 2018-04-04 MED ORDER — FLUTICASONE PROPIONATE 50 MCG/ACT NA SUSP: 2.0000 | Freq: Every day | NASAL | 6 refills | Status: DC

## 2018-04-04 MED ORDER — CYCLOBENZAPRINE HCL 5 MG PO TABS: 5.0000 mg | ORAL_TABLET | Freq: Every evening | ORAL | 2 refills | Status: DC | PRN

## 2018-04-04 MED ORDER — LORATADINE 10 MG PO TABS: 10.0000 mg | ORAL_TABLET | Freq: Every day | ORAL | 3 refills | Status: DC | PRN

## 2018-04-04 MED ORDER — MONTELUKAST SODIUM 10 MG PO TABS: 10.0000 mg | ORAL_TABLET | Freq: Every day | ORAL | 3 refills | Status: DC

## 2018-04-04 MED ORDER — NICOTINE POLACRILEX 2 MG MT GUM: 2.0000 mg | CHEWING_GUM | OROMUCOSAL | 2 refills | Status: DC | PRN

## 2018-04-04 NOTE — Patient Instructions (Addendum)
Beschel or Murphy Oil  Try Excedrine migraine  Try nicotine patch 21 daily x 6 weeks then patch 14 x 3 weeks and patch 7 x 3 weeks   F/u in 4-6 weeks   Try to avoid food triggers can try Tums     Food Choices for Gastroesophageal Reflux Disease, Adult When you have gastroesophageal reflux disease (GERD), the foods you eat and your eating habits are very important. Choosing the right foods can help ease your discomfort. What guidelines do I need to follow?  Choose fruits, vegetables, whole grains, and low-fat dairy products.  Choose low-fat meat, fish, and poultry.  Limit fats such as oils, salad dressings, butter, nuts, and avocado.  Keep a food diary. This helps you identify foods that cause symptoms.  Avoid foods that cause symptoms. These may be different for everyone.  Eat small meals often instead of 3 large meals a day.  Eat your meals slowly, in a place where you are relaxed.  Limit fried foods.  Cook foods using methods other than frying.  Avoid drinking alcohol.  Avoid drinking large amounts of liquids with your meals.  Avoid bending over or lying down until 2-3 hours after eating. What foods are not recommended? These are some foods and drinks that may make your symptoms worse: Vegetables Tomatoes. Tomato juice. Tomato and spaghetti sauce. Chili peppers. Onion and garlic. Horseradish. Fruits Oranges, grapefruit, and lemon (fruit and juice). Meats High-fat meats, fish, and poultry. This includes hot dogs, ribs, ham, sausage, salami, and bacon. Dairy Whole milk and chocolate milk. Sour cream. Cream. Butter. Ice cream. Cream cheese. Drinks Coffee and tea. Bubbly (carbonated) drinks or energy drinks. Condiments Hot sauce. Barbecue sauce. Sweets/Desserts Chocolate and cocoa. Donuts. Peppermint and spearmint. Fats and Oils High-fat foods. This includes [place] fries and potato chips. Other Vinegar. Strong spices. This includes black pepper, white  pepper, red pepper, cayenne, curry powder, cloves, ginger, and chili powder. The items listed above may not be a complete list of foods and drinks to avoid. Contact your dietitian for more information. This information is not intended to replace advice given to you by your health care provider. Make sure you discuss any questions you have with your health care provider. Document Released: 01/09/2012 Document Revised: 12/16/2015 Document Reviewed: 05/14/2013 Elsevier Interactive Patient Education  2017 Elsevier Inc.    Back Pain, Adult Many adults have back pain from time to time. Common causes of back pain include:  A strained muscle or ligament.  Wear and tear (degeneration) of the spinal disks.  Arthritis.  A hit to the back.  Back pain can be short-lived (acute) or last a long time (chronic). A physical exam, lab tests, and imaging studies may be done to find the cause of your pain. Follow these instructions at home: Managing pain and stiffness  Take over-the-counter and prescription medicines only as told by your health care provider.  If directed, apply heat to the affected area as often as told by your health care provider. Use the heat source that your health care provider recommends, such as a moist heat pack or a heating pad. ? Place a towel between your skin and the heat source. ? Leave the heat on for 20-30 minutes. ? Remove the heat if your skin turns bright red. This is especially important if you are unable to feel pain, heat, or cold. You have a greater risk of getting burned.  If directed, apply ice to the injured area: ? Put ice in a  plastic bag. ? Place a towel between your skin and the bag. ? Leave the ice on for 20 minutes, 2-3 times a day for the first 2-3 days. Activity  Do not stay in bed. Resting more than 1-2 days can delay your recovery.  Take short walks on even surfaces as soon as you are able. Try to increase the length of time you walk each  day.  Do not sit, drive, or stand in one place for more than 30 minutes at a time. Sitting or standing for long periods of time can put stress on your back.  Use proper lifting techniques. When you bend and lift, use positions that put less stress on your back: ? [place] your knees. ? Keep the load close to your body. ? Avoid twisting.  Exercise regularly as told by your health care provider. Exercising will help your back heal faster. This also helps prevent back injuries by keeping muscles strong and flexible.  Your health care provider may recommend that you see a physical therapist. This person can help you come up with a safe exercise program. Do any exercises as told by your physical therapist. Lifestyle  Maintain a healthy weight. Extra weight puts stress on your back and makes it difficult to have good posture.  Avoid activities or situations that make you feel anxious or stressed. Learn ways to manage anxiety and stress. One way to manage stress is through exercise. Stress and anxiety increase muscle tension and can make back pain worse. General instructions  Sleep on a firm mattress in a comfortable position. Try lying on your side with your knees slightly bent. If you lie on your back, put a pillow under your knees.  Follow your treatment plan as told by your health care provider. This may include: ? Cognitive or behavioral therapy. ? Acupuncture or massage therapy. ? Meditation or yoga. Contact a health care provider if:  You have pain that is not relieved with rest or medicine.  You have increasing pain going down into your legs or buttocks.  Your pain does not improve in 2 weeks.  You have pain at night.  You lose weight.  You have a fever or chills. Get help right away if:  You develop new bowel or bladder control problems.  You have unusual weakness or numbness in your arms or legs.  You develop nausea or vomiting.  You develop abdominal pain.  You feel  faint. Summary  Many adults have back pain from time to time. A physical exam, lab tests, and imaging studies may be done to find the cause of your pain.  Use proper lifting techniques. When you bend and lift, use positions that put less stress on your back.  Take over-the-counter and prescription medicines and apply heat or ice as directed by your health care provider. This information is not intended to replace advice given to you by your health care provider. Make sure you discuss any questions you have with your health care provider. Document Released: 07/10/2005 Document Revised: 08/14/2016 Document Reviewed: 08/14/2016 Elsevier Interactive Patient Education  2018 [employer].  Migraine Headache A migraine headache is an intense, throbbing pain on one side or both sides of the head. Migraines may also cause other symptoms, such as nausea, vomiting, and sensitivity to light and noise. What are the causes? Doing or taking certain things may also trigger migraines, such as:  Alcohol.  Smoking.  Medicines, such as: ? Medicine used to treat chest pain (nitroglycerine). ?  Birth control pills. ? Estrogen pills. ? Certain blood pressure medicines.  Aged cheeses, chocolate, or caffeine.  Foods or drinks that contain nitrates, glutamate, aspartame, or tyramine.  Physical activity.  Other things that may trigger a migraine include:  Menstruation.  Pregnancy.  Hunger.  Stress, lack of sleep, too much sleep, or fatigue.  Weather changes.  What increases the risk? The following factors may make you more likely to experience migraine headaches:  Age. Risk increases with age.  Family history of migraine headaches.  Being Caucasian.  Depression and anxiety.  Obesity.  Being a woman.  Having a hole in the heart (patent foramen ovale) or other heart problems.  What are the signs or symptoms? The main symptom of this condition is pulsating or throbbing pain. Pain  may:  Happen in any area of the head, such as on one side or both sides.  Interfere with daily activities.  Get worse with physical activity.  Get worse with exposure to bright lights or loud noises.  Other symptoms may include:  Nausea.  Vomiting.  Dizziness.  General sensitivity to bright lights, loud noises, or smells.  Before you get a migraine, you may get warning signs that a migraine is developing (aura). An aura may include:  Seeing flashing lights or having blind spots.  Seeing bright spots, halos, or zigzag lines.  Having tunnel vision or blurred vision.  Having numbness or a tingling feeling.  Having trouble talking.  Having muscle weakness.  How is this diagnosed? A migraine headache can be diagnosed based on:  Your symptoms.  A physical exam.  Tests, such as CT scan or MRI of the head. These imaging tests can help rule out other causes of headaches.  Taking fluid from the spine (lumbar puncture) and analyzing it (cerebrospinal fluid analysis, or CSF analysis).  How is this treated? A migraine headache is usually treated with medicines that:  Relieve pain.  Relieve nausea.  Prevent migraines from coming back.  Treatment may also include:  Acupuncture.  Lifestyle changes like avoiding foods that trigger migraines.  Follow these instructions at home: Medicines  Take over-the-counter and prescription medicines only as told by your health care provider.  Do not drive or use heavy machinery while taking prescription pain medicine.  To prevent or treat constipation while you are taking prescription pain medicine, your health care provider may recommend that you: ? Drink enough fluid to keep your urine clear or pale yellow. ? Take over-the-counter or prescription medicines. ? Eat foods that are high in fiber, such as fresh fruits and vegetables, whole grains, and beans. ? Limit foods that are high in fat and processed sugars, such as fried  and sweet foods. Lifestyle  Avoid alcohol use.  Do not use any products that contain nicotine or tobacco, such as cigarettes and e-cigarettes. If you need help quitting, ask your health care provider.  Get at least 8 hours of sleep every night.  Limit your stress. General instructions   Keep a journal to find out what may trigger your migraine headaches. For example, write down: ? What you eat and drink. ? How much sleep you get. ? Any change to your diet or medicines.  If you have a migraine: ? Avoid things that make your symptoms worse, such as bright lights. ? It may help to lie down in a dark, quiet room. ? Do not drive or use heavy machinery. ? Ask your health care provider what activities are safe for you while  you are experiencing symptoms.  Keep all follow-up visits as told by your health care provider. This is important. Contact a health care provider if:  You develop symptoms that are different or more severe than your usual migraine symptoms. Get help right away if:  Your migraine becomes severe.  You have a fever.  You have a stiff neck.  You have vision loss.  Sinusitis, Adult Sinusitis is soreness and inflammation of your sinuses. Sinuses are hollow spaces in the bones around your face. Your sinuses are located: Around your eyes. In the middle of your forehead. Behind your nose. In your cheekbones.  Your sinuses and nasal passages are lined with a stringy fluid (mucus). Mucus normally drains out of your sinuses. When your nasal tissues become inflamed or swollen, the mucus can become trapped or blocked so air cannot flow through your sinuses. This allows bacteria, viruses, and funguses to grow, which leads to infection. Sinusitis can develop quickly and last for 7?10 days (acute) or for more than 12 weeks (chronic). Sinusitis often develops after a cold. What are the causes? This condition is caused by anything that creates swelling in the sinuses or  stops mucus from draining, including: Allergies. Asthma. Bacterial or viral infection. Abnormally shaped bones between the nasal passages. Nasal growths that contain mucus (nasal polyps). Narrow sinus openings. Pollutants, such as chemicals or irritants in the air. A foreign object stuck in the nose. A fungal infection. This is rare.  What increases the risk? The following factors may make you more likely to develop this condition: Having allergies or asthma. Having had a recent cold or respiratory tract infection. Having structural deformities or blockages in your nose or sinuses. Having a weak immune system. Doing a lot of swimming or diving. Overusing nasal sprays. Smoking.  What are the signs or symptoms? The main symptoms of this condition are pain and a feeling of pressure around the affected sinuses. Other symptoms include: Upper toothache. Earache. Headache. Bad breath. Decreased sense of smell and taste. A cough that may get worse at night. Fatigue. Fever. Thick drainage from your nose. The drainage is often green and it may contain pus (purulent). Stuffy nose or congestion. Postnasal drip. This is when extra mucus collects in the throat or back of the nose. Swelling and warmth over the affected sinuses. Sore throat. Sensitivity to light.  How is this diagnosed? This condition is diagnosed based on symptoms, a medical history, and a physical exam. To find out if your condition is acute or chronic, your health care provider may: Look in your nose for signs of nasal polyps. Tap over the affected sinus to check for signs of infection. View the inside of your sinuses using an imaging device that has a light attached (endoscope).  If your health care provider suspects that you have chronic sinusitis, you may also: Be tested for allergies. Have a sample of mucus taken from your nose (nasal culture) and checked for bacteria. Have a mucus sample examined to see if your  sinusitis is related to an allergy.  If your sinusitis does not respond to treatment and it lasts longer than 8 weeks, you may have an MRI or CT scan to check your sinuses. These scans also help to determine how severe your infection is. In rare cases, a bone biopsy may be done to rule out more serious types of fungal sinus disease. How is this treated? Treatment for sinusitis depends on the cause and whether your condition is chronic or  acute. If a virus is causing your sinusitis, your symptoms will go away on their own within 10 days. You may be given medicines to relieve your symptoms, including: Topical nasal decongestants. They shrink swollen nasal passages and let mucus drain from your sinuses. Antihistamines. These drugs block inflammation that is triggered by allergies. This can help to ease swelling in your nose and sinuses. Topical nasal corticosteroids. These are nasal sprays that ease inflammation and swelling in your nose and sinuses. Nasal saline washes. These rinses can help to get rid of thick mucus in your nose.  If your condition is caused by bacteria, you will be given an antibiotic medicine. If your condition is caused by a fungus, you will be given an antifungal medicine. Surgery may be needed to correct underlying conditions, such as narrow nasal passages. Surgery may also be needed to remove polyps. Follow these instructions at home: Medicines Take, use, or apply over-the-counter and prescription medicines only as told by your health care provider. These may include nasal sprays. If you were prescribed an antibiotic medicine, take it as told by your health care provider. Do not stop taking the antibiotic even if you start to feel better. Hydrate and Humidify Drink enough water to keep your urine clear or pale yellow. Staying hydrated will help to thin your mucus. Use a cool mist humidifier to keep the humidity level in your home above 50%. Inhale steam for 10-15 minutes,  3-4 times a day or as told by your health care provider. You can do this in the bathroom while a hot shower is running. Limit your exposure to cool or dry air. Rest Rest as much as possible. Sleep with your head raised (elevated). Make sure to get enough sleep each night. General instructions Apply a warm, moist washcloth to your face 3-4 times a day or as told by your health care provider. This will help with discomfort. Wash your hands often with soap and water to reduce your exposure to viruses and other germs. If soap and water are not available, use hand sanitizer. Do not smoke. Avoid being around people who are smoking (secondhand smoke). Keep all follow-up visits as told by your health care provider. This is important. Contact a health care provider if: You have a fever. Your symptoms get worse. Your symptoms do not improve within 10 days. Get help right away if: You have a severe headache. You have persistent vomiting. You have pain or swelling around your face or eyes. You have vision problems. You develop confusion. Your neck is stiff. You have trouble breathing. This information is not intended to replace advice given to you by your health care provider. Make sure you discuss any questions you have with your health care provider. Document Released: 07/10/2005 Document Revised: 03/05/2016 Document Reviewed: 05/05/2015 Elsevier Interactive Patient Education  2018 [place] muscles feel weak or like you cannot control them.  You start to lose your balance often.  You develop trouble walking.  You faint. This information is not intended to replace advice given to you by your health care provider. Make sure you discuss any questions you have with your health care provider. Document Released: 07/10/2005 Document Revised: 01/28/2016 Document Reviewed: 12/27/2015 Elsevier Interactive Patient Education  2017 [employer].

## 2018-04-04 NOTE — Progress Notes (Signed)
Pre visit review using our clinic review tool, if applicable. No additional management support is needed unless otherwise documented below in the visit note. 

## 2018-04-04 NOTE — Progress Notes (Addendum)
Chief Complaint  Patient presents with  . Establish Care   New patient multiple complaints  1. Chronic low back pain and mid back swelling tried stretching helps sometimes tried Tylenol, Ibuprofen when back flares had flare Tuesday 10/10 today 6-7/10 rads b/l legs and leg weakness she reports fall 3 weeks ago. Reviewed Xrays 2015, 2016, 2018 negative. Mobic 15 mg did not help in the past Norflex or Flexeril did help  She would like to try seeing chiropractor and insurance will cover 3 visits   2. Tobacco abuse smoking 1 ppd wants to quit and try medication to quit   3. C/o chronic sinus issues/allergies and migraines h/a and wants refill of Claritin D advised not good to continue to be on D formulation   4. Reviewed prior US and CT ab/pelvis with enlarged cervix, mild endometrial thickening, adnexal mass and h/o trich.   Review of Systems  Constitutional: Negative for weight loss.  HENT: Negative for hearing loss and sinus pain.   Eyes: Negative for blurred vision.  Respiratory: Negative for shortness of breath.   Cardiovascular: Negative for chest pain.  Gastrointestinal: Negative for abdominal pain.  Musculoskeletal: Positive for back pain and falls.  Skin: Negative for rash.  Neurological: Negative for headaches.  Psychiatric/Behavioral: Negative for depression.   Past Medical History:  Diagnosis Date  . Asthma   . Back pain   . Chronic sinusitis   . GERD (gastroesophageal reflux disease)   . Heart murmur   . Leaky heart valve   . Migraine   . Migraine   . Trichomonas infection    Past Surgical History:  Procedure Laterality Date  . BREAST SURGERY     left breast abcess   Family History  Problem Relation Age of Onset  . Asthma Mother   . COPD Mother   . Diabetes Mother   . Miscarriages / [place] Mother   . Diabetes Father   . Heart disease Father   . Hypertension Father   . Asthma Daughter   . Miscarriages / [place] Daughter   . Arthritis Maternal  Grandmother   . Asthma Maternal Grandmother   . Hyperlipidemia Maternal Grandmother   . Diabetes Maternal Grandfather   . Stroke Paternal Grandmother   . Heart disease Brother    Social History   Socioeconomic History  . Marital status: Single    Spouse name: Not on file  . Number of children: Not on file  . Years of education: Not on file  . Highest education level: Not on file  Occupational History  . Not on file  Social Needs  . Financial resource strain: Not on file  . Food insecurity:    Worry: Not on file    Inability: Not on file  . Transportation needs:    Medical: Not on file    Non-medical: Not on file  Tobacco Use  . Smoking status: Current Some Day Smoker    Packs/day: 0.50    Types: Cigarettes  . Smokeless tobacco: Never Used  . Tobacco comment: 1 ppd   Substance and Sexual Activity  . Alcohol use: Yes  . Drug use: No  . Sexual activity: Yes    Comment: men  Lifestyle  . Physical activity:    Days per week: Not on file    Minutes per session: Not on file  . Stress: Not on file  Relationships  . Social connections:    Talks on phone: Not on file    Gets together: Not  on file    Attends religious service: Not on file    Active member of club or organization: Not on file    Attends meetings of clubs or organizations: Not on file    Relationship status: Not on file  . Intimate partner violence:    Fear of current or ex partner: Not on file    Emotionally abused: Not on file    Physically abused: Not on file    Forced sexual activity: Not on file  Other Topics Concern  . Not on file  Social History Narrative   GED   Works [place]    2 kids    No guns, wears selt belt, safe in relationship    Current Meds  Medication Sig  . [DISCONTINUED] loratadine-pseudoephedrine (CLARITIN-D 24-HOUR) 10-240 MG 24 hr tablet Take 1 tablet by mouth daily.   Allergies  Allergen Reactions  . Shellfish Allergy Itching   Recent Results (from the  past 2160 hour(s))  Pregnancy, urine     Status: None   Collection Time: 02/06/18  4:00 PM  Result Value Ref Range   Preg Test, Ur NEGATIVE NEGATIVE    Comment: Performed at [facility], [address] [place], [place] [address]  Urinalysis, Complete w Microscopic     Status: Abnormal   Collection Time: 02/06/18  4:00 PM  Result Value Ref Range   Color, Urine YELLOW YELLOW   APPearance CLEAR CLEAR   Specific Gravity, Urine 1.020 1.005 - 1.030   pH 6.0 5.0 - 8.0   Glucose, UA NEGATIVE NEGATIVE mg/dL   Hgb urine dipstick NEGATIVE NEGATIVE   Bilirubin Urine NEGATIVE NEGATIVE   Ketones, ur NEGATIVE NEGATIVE mg/dL   Protein, ur NEGATIVE NEGATIVE mg/dL   Nitrite NEGATIVE NEGATIVE   Leukocytes, UA NEGATIVE NEGATIVE   Squamous Epithelial / LPF 0-5 0 - 5   WBC, UA 0-5 0 - 5 WBC/hpf   RBC / HPF 0-5 0 - 5 RBC/hpf   Bacteria, UA RARE (A) NONE SEEN    Comment: Performed at [facility], [address], [place], [place] [address]  Lipase, blood     Status: None   Collection Time: 02/06/18  5:43 PM  Result Value Ref Range   Lipase 28 11 - 51 U/L    Comment: Performed at [facility], [address] [place], [place] [address]  Comprehensive metabolic panel     Status: Abnormal   Collection Time: 02/06/18  5:43 PM  Result Value Ref Range   Sodium 140 135 - 145 mmol/L   Potassium 4.0 3.5 - 5.1 mmol/L   Chloride 105 98 - 111 mmol/L    Comment: Please note change in reference range.   CO2 26 22 - 32 mmol/L   Glucose, Bld 109 (H) 70 - 99 mg/dL    Comment: Please note change in reference range.   BUN 12 6 - 20 mg/dL    Comment: Please note change in reference range.   Creatinine, Ser 0.72 0.44 - 1.00 mg/dL   Calcium 9.6 8.9 - 10.3 mg/dL   Total Protein 7.8 6.5 - 8.1 g/dL   Albumin 4.5 3.5 - 5.0 g/dL   AST 40 15 - 41 U/L   ALT 41 0 - 44 U/L    Comment: Please note change in reference range.   Alkaline Phosphatase 98 38 - 126 U/L   Total Bilirubin 0.5  0.3 - 1.2 mg/dL   GFR calc  non Af Amer >60 >60 mL/min   GFR calc Af Amer >60 >60 mL/min    Comment: (NOTE) The eGFR has been calculated using the CKD EPI equation. This calculation has not been validated in all clinical situations. eGFR's persistently <60 mL/min signify possible Chronic Kidney Disease.    Anion gap 9 5 - 15    Comment: Performed at [facility], [address] [place], [place] [address]  CBC     Status: None   Collection Time: 02/06/18  5:43 PM  Result Value Ref Range   WBC 8.8 3.6 - 11.0 K/uL   RBC 4.54 3.80 - 5.20 MIL/uL   Hemoglobin 14.2 12.0 - 16.0 g/dL   HCT 41.5 35.0 - 47.0 %   MCV 91.4 80.0 - 100.0 fL   MCH 31.3 26.0 - 34.0 pg   MCHC 34.3 32.0 - 36.0 g/dL   RDW 13.4 11.5 - 14.5 %   Platelets 350 150 - 440 K/uL    Comment: Performed at [facility], [address] [place], [place] [address]  Urinalysis, Complete w Microscopic     Status: Abnormal   Collection Time: 02/06/18  5:43 PM  Result Value Ref Range   Color, Urine COLORLESS (A) YELLOW   APPearance CLEAR (A) CLEAR   Specific Gravity, Urine 1.003 (L) 1.005 - 1.030   pH 6.0 5.0 - 8.0   Glucose, UA NEGATIVE NEGATIVE mg/dL   Hgb urine dipstick NEGATIVE NEGATIVE   Bilirubin Urine NEGATIVE NEGATIVE   Ketones, ur NEGATIVE NEGATIVE mg/dL   Protein, ur NEGATIVE NEGATIVE mg/dL   Nitrite NEGATIVE NEGATIVE   Leukocytes, UA NEGATIVE NEGATIVE   WBC, UA 0-5 0 - 5 WBC/hpf   Bacteria, UA NONE SEEN NONE SEEN   Squamous Epithelial / LPF NONE SEEN 0 - 5    Comment: Performed at [facility], [address] [place], [place] [address]   Objective  Body mass index is 30.51 kg/m. Wt Readings from Last 3 Encounters:  04/04/18 156 lb 3.2 oz (70.9 kg)  02/06/18 156 lb (70.8 kg)  02/15/17 156 lb (70.8 kg)   Temp Readings from Last 3 Encounters:  04/04/18 97.9 F (36.6 C) (Oral)  02/06/18 98.4 F (36.9 C) (Oral)  02/06/18 98.3 F (36.8 C) (Oral)   BP Readings from Last 3  Encounters:  04/04/18 138/78  02/06/18 (!) 144/78  02/06/18 (!) 146/84   Pulse Readings from Last 3 Encounters:  04/04/18 82  02/06/18 77  02/06/18 88    Physical Exam  Constitutional: She is oriented to person, place, and time. Vital signs are normal. She appears well-developed and well-nourished. She is cooperative.  HENT:  Head: Normocephalic and atraumatic.  Mouth/Throat: Oropharynx is clear and moist and mucous membranes are normal.  Eyes: Pupils are equal, round, and reactive to light. Conjunctivae are normal.  Cardiovascular: Normal rate and regular rhythm.  Murmur heard. Pulmonary/Chest: Effort normal and breath sounds normal.  Musculoskeletal:       Lumbar back: She exhibits tenderness.  +right leg str8 leg test    Neurological: She is alert and oriented to person, place, and time. Gait normal.  Skin: Skin is warm, dry and intact.  Psychiatric: She has a normal mood and affect. Her speech is normal and behavior is normal. Judgment and thought content normal. Cognition and memory are normal.  Nursing note and vitals reviewed.   Assessment   1. 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 Chronic  sinusitis   3 .Tobacco abuse 1 ppd   4. H/o migraines   5. H/o adnexal enlargement, enlarged cervix, fibroids noted prior CT and US imaging trich, due for pap   6. Cardiac murmur   7. GERD  8. H/o prediabetes   9. HM  Plan   1. Refer MRI w/o and chiropractor beschel  Prn Tylenol and flexeril  mobic did not help in the past  2.  Trial of flonase, claritin and singulair  3. rec cessation  Nicotine gum rx  4.  Disc excedrin migriane otc try and try to control sinus issues see above  5.  Referred ob/gyn Dr. Georgianne Fick needs pap  6. Consider echo in future  7. Monitor  8. Check labs  9.  sch fasting lab s  Flu shot given today  Tdap ? Date review prior records  Consider MMR vaccine and check hep B/C status  Consider shingirx   Per  pt had HIV test [facility] requested records today   Referred mammogram today Referred pap also h/o fibroids, enlarged cervix, adenaxal mass, trichomonas, mild endometrial thickening referred to Dr. Georgianne Fick  Colonoscopy never had disc today  Smoker 1ppd Rx nicotine gum today pt wants to try to quit   02/12/19 c/o cough with mucous, h/a x 1-2 days with boyfriend Shaaron Adler also a pt with similar sx's and one of his friends tested + COVID 29 and he has been around Currently not working and no insurance advised if they decline to test here rec [facility] [address]  Provider: Dr. Olivia Mackie McLean-Scocuzza-Internal Medicine

## 2018-04-18 ENCOUNTER — Ambulatory Visit
Admission: RE | Admit: 2018-04-18 | Discharge: 2018-04-18 | Disposition: A | Discharge status: Home / Self Care | Payer: Commercial Managed Care - HMO | Source: Ambulatory Visit | Attending: Internal Medicine | Admitting: Internal Medicine

## 2018-04-18 DIAGNOSIS — M5116 Intervertebral disc disorders with radiculopathy, lumbar region: Secondary | ICD-10-CM | POA: Insufficient documentation

## 2018-04-18 DIAGNOSIS — G8929 Other chronic pain: Secondary | ICD-10-CM | POA: Diagnosis not present

## 2018-04-18 DIAGNOSIS — M5441 Lumbago with sciatica, right side: Secondary | ICD-10-CM

## 2018-04-18 DIAGNOSIS — M5442 Lumbago with sciatica, left side: Secondary | ICD-10-CM | POA: Diagnosis not present

## 2018-04-18 DIAGNOSIS — M545 Low back pain: Secondary | ICD-10-CM | POA: Diagnosis not present

## 2018-04-19 ENCOUNTER — Telehealth: Payer: Self-pay | Admitting: Internal Medicine

## 2018-04-19 NOTE — Telephone Encounter (Signed)
Left message for pt to return call to office for results. See result note

## 2018-04-19 NOTE — Telephone Encounter (Signed)
Copied from [place] [id number]. Topic: Quick Communication - Lab Results >> Apr 19, 2018  3:15 PM Babs Bertin, CMA wrote: Called patient to inform them of [phone] lab results. When patient returns call, triage nurse may disclose results. >> Apr 19, 2018  3:36 PM Bea Graff, NT wrote: Patient returning call to receive lab results.

## 2018-04-24 ENCOUNTER — Encounter: Payer: Self-pay | Admitting: Obstetrics and Gynecology

## 2018-04-25 ENCOUNTER — Ambulatory Visit (INDEPENDENT_AMBULATORY_CARE_PROVIDER_SITE_OTHER): Payer: 59 | Admitting: Obstetrics and Gynecology

## 2018-04-25 ENCOUNTER — Encounter: Payer: Self-pay | Admitting: Obstetrics and Gynecology

## 2018-04-25 ENCOUNTER — Other Ambulatory Visit (HOSPITAL_COMMUNITY)
Admission: RE | Admit: 2018-04-25 | Discharge: 2018-04-25 | Disposition: A | Discharge status: Home / Self Care | Payer: Commercial Managed Care - HMO | Source: Ambulatory Visit | Attending: Obstetrics and Gynecology | Admitting: Obstetrics and Gynecology

## 2018-04-25 VITALS — BP 128/74 | HR 98 | Ht 61.0 in | Wt 158.0 lb

## 2018-04-25 DIAGNOSIS — Z124 Encounter for screening for malignant neoplasm of cervix: Secondary | ICD-10-CM | POA: Insufficient documentation

## 2018-04-25 DIAGNOSIS — Z1239 Encounter for other screening for malignant neoplasm of breast: Secondary | ICD-10-CM | POA: Diagnosis not present

## 2018-04-25 DIAGNOSIS — N950 Postmenopausal bleeding: Secondary | ICD-10-CM

## 2018-04-25 DIAGNOSIS — D251 Intramural leiomyoma of uterus: Secondary | ICD-10-CM

## 2018-04-25 DIAGNOSIS — Z1211 Encounter for screening for malignant neoplasm of colon: Secondary | ICD-10-CM

## 2018-04-25 NOTE — Progress Notes (Signed)
Gynecology H&P  Chief Complaint:  Chief Complaint  Patient presents with  . Fibroids    referred by [facility] h/o fibroids    History of Present Illness: Patient is a 52 y.o. No obstetric history on file. presents evaluation of postmenopausal bleeding. The patient states she has had a multiple episode(s) of bleeding in the past 2 year(s).  The most recent episode occurred  3  month(s) ago.  The bleeding has been limited to spotting . She describes the blood as Bright red in appearance.  She has had no additional complaints. She deniestrauma or other inciting event, bloating, cramping and early satiety. She does not have a history of abnormal pap smears. There are no other aggravating factors reported. There are no alleviating factors reported. The patient's past medical history is noncontributory.  Recent imaging for unrelated reasons revealed globally enlarge uterus.   She has nothad prior work up for postmenopausal bleeding.  Review of Systems: 10 point review of systems negative unless otherwise noted in HPI  Past Medical History:  Past Medical History:  Diagnosis Date  . Asthma   . Back pain   . Chronic sinusitis   . GERD (gastroesophageal reflux disease)   . Heart murmur   . Leaky heart valve   . Migraine   . Migraine   . Trichomonas infection     Past Surgical History:  Past Surgical History:  Procedure Laterality Date  . BREAST SURGERY     left breast abcess    Family History:  Family History  Problem Relation Age of Onset  . Asthma Mother   . COPD Mother   . Diabetes Mother   . Miscarriages / [place] Mother   . Diabetes Father   . Heart disease Father   . Hypertension Father   . Asthma Daughter   . Miscarriages / [place] Daughter   . Arthritis Maternal Grandmother   . Asthma Maternal Grandmother   . Hyperlipidemia Maternal Grandmother   . Diabetes Maternal Grandfather   . Stroke Paternal Grandmother   . Heart disease Brother     Social History:    Social History   Socioeconomic History  . Marital status: Single    Spouse name: Not on file  . Number of children: Not on file  . Years of education: Not on file  . Highest education level: Not on file  Occupational History  . Not on file  Social Needs  . Financial resource strain: Not on file  . Food insecurity:    Worry: Not on file    Inability: Not on file  . Transportation needs:    Medical: Not on file    Non-medical: Not on file  Tobacco Use  . Smoking status: Current Some Day Smoker    Packs/day: 0.50    Types: Cigarettes  . Smokeless tobacco: Never Used  . Tobacco comment: 1 ppd   Substance and Sexual Activity  . Alcohol use: Yes  . Drug use: No  . Sexual activity: Yes    Comment: men  Lifestyle  . Physical activity:    Days per week: Not on file    Minutes per session: Not on file  . Stress: Not on file  Relationships  . Social connections:    Talks on phone: Not on file    Gets together: Not on file    Attends religious service: Not on file    Active member of club or organization: Not on file    Attends meetings  of clubs or organizations: Not on file    Relationship status: Not on file  . Intimate partner violence:    Fear of current or ex partner: Not on file    Emotionally abused: Not on file    Physically abused: Not on file    Forced sexual activity: Not on file  Other Topics Concern  . Not on file  Social History Narrative   GED   Works [employer]    2 kids    No guns, wears selt belt, safe in relationship     Allergies:  Allergies  Allergen Reactions  . Shellfish Allergy Itching    Medications: Prior to Admission medications   Medication Sig Start Date End Date Taking? Authorizing Provider  cyclobenzaprine (FLEXERIL) 5 MG tablet Take 1 tablet (5 mg total) by mouth at bedtime as needed for muscle spasms. 04/04/18  Yes McLean-Scocuzza, Nino Glow, MD  fluticasone (FLONASE) 50 MCG/ACT nasal spray Place 2 sprays into both  nostrils daily. Prn max 2 sprays 04/04/18  Yes McLean-Scocuzza, Nino Glow, MD  loratadine (CLARITIN) 10 MG tablet Take 1 tablet (10 mg total) by mouth daily as needed for allergies. 04/04/18  Yes McLean-Scocuzza, Nino Glow, MD  montelukast (SINGULAIR) 10 MG tablet Take 1 tablet (10 mg total) by mouth at bedtime. 04/04/18  Yes McLean-Scocuzza, Nino Glow, MD  nicotine polacrilex (NICORETTE) 2 MG gum Take 1 each (2 mg total) by mouth as needed for smoking cessation. q1-2 hours x 6 weeks then q2-4 hours x 3 weeks then q4-8 hrs x 3 weeks. Max 24 pieces in 24 hours 04/04/18  Yes McLean-Scocuzza, Nino Glow, MD    Physical Exam Vitals: Blood pressure 128/74, pulse 98, height 5\' 1"  (1.549 m), weight 158 lb (71.7 kg).  General: NAD HEENT: normocephalic, anicteric Neck: Thyroid non-enlarged, no nodules Pulmonary: No increased work of breathing Abdomen: NABS, soft, non-tender, non-distended.  Umbilicus without lesions.  No hepatomegaly, splenomegaly or masses palpable. No evidence of hernia  Genitourinary:  External: Normal external female genitalia.  Normal urethral meatus, normal  Bartholin's and Skene's glands.    Vagina: Normal vaginal mucosa, no evidence of prolapse.    Cervix: Grossly normal in appearance, no bleeding  Uterus: Enlarged, 15 week size, mobile, normal contour.  No CMT  Adnexa: ovaries non-enlarged, no adnexal masses  Rectal: deferred Extremities: no edema, erythema, or tenderness Neurologic: Grossly intact Psychiatric: mood appropriate, affect full  Assessment: 52 y.o. No obstetric history on file. presenting for evaluation of postmenopausal bleeding  Plan: Problem List Items Addressed This Visit    None    Visit Diagnoses    Postmenopausal bleeding    -  Primary   Relevant Orders   US Transvaginal Non-OB   Intramural leiomyoma of uterus       Relevant Orders   US Transvaginal Non-OB   Screening for malignant neoplasm of cervix       Relevant Orders   Cytology - PAP (Completed)    Breast screening       Relevant Orders   MM 3D SCREEN BREAST BILATERAL   Colon cancer screening       Relevant Orders   Ambulatory referral to Gastroenterology      1) We discussed that menopause is a clinical diagnosis made after 12 months of amenorrhea.  The average age of menopause in the  [place] population is 12 but there may be significant variation.  Any bleeding that happens after a 12 month period  of amenorrhea warrants further work.  Possible etiologies of postmenopausal bleeding were discussed with the patient today.  These may range from benign etiologies such as urethral prolapse and atrophy, to indeterminate lesions such as submucosal fibroids or polyps which would require resection to accurately evaluate. The role of unopposed estrogen in the development of  dndometrial hyperplasia or carcinoma is discussed.  The risk of endometrial hyperplasia is linearly correlated with increasing BMI given the production of estrone by adipose tissue.  Work up will be include transvaginal ultrasound to assess the thickness of the endometrial lining as well as to assess for focal uterine lesions.  Negative ultrasound evaluation, defined as the absence of focal lesions and endometrial stripe of <22mm, effectively rules out carcinoma and confirms atrophy as the most likely etiology.  Should focal lesions be present these generally require hysteroscopic resection.  Should lining be greater >38mm endometrial biopsy is warranted to rule out hyperplasia or frank endometrial cancer.  Continued episodes of bleeding despite negative ultrasound also warrant endometrial sampling.  As the cervical pathology may also be implicated in postmenopausal bleeding prior cervical cytology was reviewed and repeated if required per ASCCP guidelines.   2) Evaluation by pelvc ultrasound scheduled, with follow up after [place]. EMB discussed and may be performed as well. Pros and cons of these modalities of testing discussed.   3)  Return in about 1 week (around 05/02/2018) for TVUS and follow up.   Malachy Mood, MD, [facility], [facility] 04/28/2018, 11:27 PM

## 2018-04-25 NOTE — Patient Instructions (Signed)
[facility] [address] [place] [address]  [facility] [place]  [address] [place]  [address]  Phone: [phone]  

## 2018-04-26 LAB — CYTOLOGY - PAP
Diagnosis: NEGATIVE
HPV: NOT DETECTED

## 2018-05-02 ENCOUNTER — Other Ambulatory Visit (INDEPENDENT_AMBULATORY_CARE_PROVIDER_SITE_OTHER): Payer: 59

## 2018-05-02 ENCOUNTER — Ambulatory Visit (INDEPENDENT_AMBULATORY_CARE_PROVIDER_SITE_OTHER): Payer: 59

## 2018-05-02 ENCOUNTER — Ambulatory Visit (INDEPENDENT_AMBULATORY_CARE_PROVIDER_SITE_OTHER): Payer: 59 | Admitting: Obstetrics and Gynecology

## 2018-05-02 ENCOUNTER — Encounter: Payer: Self-pay | Admitting: Obstetrics and Gynecology

## 2018-05-02 ENCOUNTER — Other Ambulatory Visit (HOSPITAL_COMMUNITY)
Admission: RE | Admit: 2018-05-02 | Discharge: 2018-05-02 | Disposition: A | Discharge status: Home / Self Care | Payer: Commercial Managed Care - HMO | Source: Ambulatory Visit | Attending: Obstetrics and Gynecology | Admitting: Obstetrics and Gynecology

## 2018-05-02 VITALS — BP 140/90 | Wt 158.0 lb

## 2018-05-02 DIAGNOSIS — D252 Subserosal leiomyoma of uterus: Secondary | ICD-10-CM

## 2018-05-02 DIAGNOSIS — N858 Other specified noninflammatory disorders of uterus: Secondary | ICD-10-CM | POA: Diagnosis not present

## 2018-05-02 DIAGNOSIS — Z0184 Encounter for antibody response examination: Secondary | ICD-10-CM

## 2018-05-02 DIAGNOSIS — Z0000 Encounter for general adult medical examination without abnormal findings: Secondary | ICD-10-CM

## 2018-05-02 DIAGNOSIS — D259 Leiomyoma of uterus, unspecified: Secondary | ICD-10-CM | POA: Diagnosis not present

## 2018-05-02 DIAGNOSIS — Z1159 Encounter for screening for other viral diseases: Secondary | ICD-10-CM

## 2018-05-02 DIAGNOSIS — D251 Intramural leiomyoma of uterus: Secondary | ICD-10-CM

## 2018-05-02 DIAGNOSIS — N950 Postmenopausal bleeding: Secondary | ICD-10-CM

## 2018-05-02 DIAGNOSIS — R7303 Prediabetes: Secondary | ICD-10-CM

## 2018-05-02 DIAGNOSIS — E559 Vitamin D deficiency, unspecified: Secondary | ICD-10-CM

## 2018-05-02 DIAGNOSIS — Z1329 Encounter for screening for other suspected endocrine disorder: Secondary | ICD-10-CM

## 2018-05-02 DIAGNOSIS — Z13818 Encounter for screening for other digestive system disorders: Secondary | ICD-10-CM

## 2018-05-02 DIAGNOSIS — Z13220 Encounter for screening for lipoid disorders: Secondary | ICD-10-CM

## 2018-05-02 NOTE — Progress Notes (Signed)
Gynecology Ultrasound Follow Up  Chief Complaint:  Chief Complaint  Patient presents with  . Follow-up    GYN U/S     History of Present Illness: Patient is a 52 y.o. female who presents today for ultrasound evaluation of postmenopausal bleeding and uterine fibroids.  Ultrasound demonstrates the following findgins Adnexa: no masses seen Uterus: Two small intramural fibroids <2cm with endometrial stripe 4.48mm without evidence of focal lesions Additional: no free fluid  Review of Systems: ROS  Past Medical History:  Past Medical History:  Diagnosis Date  . Asthma   . Back pain   . Chronic sinusitis   . GERD (gastroesophageal reflux disease)   . Heart murmur   . Leaky heart valve   . Migraine   . Migraine   . Trichomonas infection     Past Surgical History:  Past Surgical History:  Procedure Laterality Date  . BREAST SURGERY     left breast abcess    Gynecologic History:  No LMP recorded. Patient is postmenopausal. Contraception: post menopausal status Last Pap: 04/25/2018 Results were: .NIL HPV negative  Family History:  Family History  Problem Relation Age of Onset  . Asthma Mother   . COPD Mother   . Diabetes Mother   . Miscarriages / [place] Mother   . Diabetes Father   . Heart disease Father   . Hypertension Father   . Asthma Daughter   . Miscarriages / [place] Daughter   . Arthritis Maternal Grandmother   . Asthma Maternal Grandmother   . Hyperlipidemia Maternal Grandmother   . Diabetes Maternal Grandfather   . Stroke Paternal Grandmother   . Heart disease Brother     Social History:  Social History   Socioeconomic History  . Marital status: Single    Spouse name: Not on file  . Number of children: Not on file  . Years of education: Not on file  . Highest education level: Not on file  Occupational History  . Not on file  Social Needs  . Financial resource strain: Not on file  . Food insecurity:    Worry: Not on file   Inability: Not on file  . Transportation needs:    Medical: Not on file    Non-medical: Not on file  Tobacco Use  . Smoking status: Current Some Day Smoker    Packs/day: 0.50    Types: Cigarettes  . Smokeless tobacco: Never Used  . Tobacco comment: 1 ppd   Substance and Sexual Activity  . Alcohol use: Yes  . Drug use: No  . Sexual activity: Yes    Comment: men  Lifestyle  . Physical activity:    Days per week: Not on file    Minutes per session: Not on file  . Stress: Not on file  Relationships  . Social connections:    Talks on phone: Not on file    Gets together: Not on file    Attends religious service: Not on file    Active member of club or organization: Not on file    Attends meetings of clubs or organizations: Not on file    Relationship status: Not on file  . Intimate partner violence:    Fear of current or ex partner: Not on file    Emotionally abused: Not on file    Physically abused: Not on file    Forced sexual activity: Not on file  Other Topics Concern  . Not on file  Social History Narrative  GED   Works [employer]    2 kids    No guns, wears selt belt, safe in relationship     Allergies:  Allergies  Allergen Reactions  . Shellfish Allergy Itching    Medications: Prior to Admission medications   Medication Sig Start Date End Date Taking? Authorizing Provider  cyclobenzaprine (FLEXERIL) 5 MG tablet Take 1 tablet (5 mg total) by mouth at bedtime as needed for muscle spasms. 04/04/18  Yes McLean-Scocuzza, Nino Glow, MD  fluticasone (FLONASE) 50 MCG/ACT nasal spray Place 2 sprays into both nostrils daily. Prn max 2 sprays 04/04/18  Yes McLean-Scocuzza, Nino Glow, MD  loratadine (CLARITIN) 10 MG tablet Take 1 tablet (10 mg total) by mouth daily as needed for allergies. 04/04/18  Yes McLean-Scocuzza, Nino Glow, MD  montelukast (SINGULAIR) 10 MG tablet Take 1 tablet (10 mg total) by mouth at bedtime. 04/04/18  Yes McLean-Scocuzza, Nino Glow, MD    nicotine polacrilex (NICORETTE) 2 MG gum Take 1 each (2 mg total) by mouth as needed for smoking cessation. q1-2 hours x 6 weeks then q2-4 hours x 3 weeks then q4-8 hrs x 3 weeks. Max 24 pieces in 24 hours 04/04/18  Yes McLean-Scocuzza, Nino Glow, MD    Physical Exam Vitals: Blood pressure 140/90, weight 158 lb (71.7 kg).  General: NAD HEENT: normocephalic, anicteric Pulmonary: No increased work of breathing Extremities: no edema, erythema, or tenderness Neurologic: Grossly intact, normal gait Psychiatric: mood appropriate, affect full  ENDOMETRIAL BIOPSY     The indications for endometrial biopsy were reviewed.   Risks of the biopsy including cramping, bleeding, infection, uterine perforation, inadequate specimen and need for additional procedures  were discussed. The patient states she understands and agrees to undergo procedure today. Consent was signed. Time out was performed. Urine HCG was negative. A Graves speculum was placed and the cervix was brought into view.  The cervix was prepped with Betadine. A single-toothed tenaculum was  placed on the anterior lip of the cervix for traction. A 3 mm pipelle was introduced through the cervix into the endometrial cavity without difficulty to a depth of 8cm, and a small amount of tissue was obtained, the resulting specime sent to pathology. The instruments were removed from the patient's vagina. Minimal bleeding from the cervix was noted. The patient tolerated the procedure well. Routine post-procedure instructions were given to the patient.  She will be contacted by phone one results become available.     Assessment: 52 y.o. small intramural fibroids and slightly thickened endometrial stripe  Plan: Problem List Items Addressed This Visit    None    Visit Diagnoses    Postmenopausal bleeding    -  Primary   Relevant Orders   Surgical pathology      1) Postmenopausal bleeding - endometrial biopsy obtained today.  Low overall suspicion of  hyperplasia given just above 62mm  2) Uterine fibroids - small, no significant mass effect.  In a postmenopausal patient these do not warrant further follow up.  3) A total of 15 minutes were spent in face-to-face contact with the patient during this encounter with over half of that time devoted to counseling and coordination of care.  4) Return in about 1 year (around 05/03/2019) for annual.  Malachy Mood, MD, [place], [facility] 05/02/2018, 11:36 AM

## 2018-05-02 NOTE — Addendum Note (Signed)
Addended by: Arby Barrette on: 05/02/2018 08:58 AM   Modules accepted: Orders

## 2018-05-06 LAB — LIPID PANEL
Cholesterol: 238 mg/dL — ABNORMAL HIGH (ref <200)
HDL: 38 mg/dL — ABNORMAL LOW (ref >50)
LDL Cholesterol (Calc): 175 mg/dL (calc) — ABNORMAL HIGH
Non-HDL Cholesterol (Calc): 200 mg/dL (calc) — ABNORMAL HIGH (ref <130)
Total CHOL/HDL Ratio: 6.3 (calc) — ABNORMAL HIGH (ref <5.0)
Triglycerides: 122 mg/dL (ref <150)

## 2018-05-06 LAB — HEPATITIS C ANTIBODY
Hepatitis C Ab: NONREACTIVE
SIGNAL TO CUT-OFF: 0.01 (ref <1.00)

## 2018-05-06 LAB — VITAMIN D 25 HYDROXY (VIT D DEFICIENCY, FRACTURES): Vit D, 25-Hydroxy: 23 ng/mL — ABNORMAL LOW (ref 30–100)

## 2018-05-06 LAB — MEASLES/MUMPS/RUBELLA IMMUNITY
Mumps IgG: <9 AU/mL — ABNORMAL LOW
Rubella: 1.77 index
Rubeola IgG: <25 AU/mL — ABNORMAL LOW

## 2018-05-06 LAB — HEMOGLOBIN A1C
Hgb A1c MFr Bld: 6 % of total Hgb — ABNORMAL HIGH (ref <5.7)
Mean Plasma Glucose: 126 (calc)
eAG (mmol/L): 7 (calc)

## 2018-05-06 LAB — HEPATITIS B SURFACE ANTIGEN: Hepatitis B Surface Ag: NONREACTIVE

## 2018-05-06 LAB — HEPATITIS B SURFACE ANTIBODY, QUANTITATIVE: Hepatitis B-Post: <5 m[IU]/mL — ABNORMAL LOW (ref >=10)

## 2018-05-06 LAB — TSH: TSH: 1.3 mIU/L

## 2018-05-08 ENCOUNTER — Encounter: Payer: Self-pay | Admitting: Obstetrics and Gynecology

## 2018-05-09 ENCOUNTER — Other Ambulatory Visit: Payer: Self-pay | Admitting: Internal Medicine

## 2018-05-09 DIAGNOSIS — G8929 Other chronic pain: Secondary | ICD-10-CM

## 2018-05-09 DIAGNOSIS — M5126 Other intervertebral disc displacement, lumbar region: Secondary | ICD-10-CM

## 2018-05-09 DIAGNOSIS — M5440 Lumbago with sciatica, unspecified side: Principal | ICD-10-CM

## 2018-05-16 ENCOUNTER — Ambulatory Visit: Payer: 59

## 2018-05-17 DIAGNOSIS — M5136 Other intervertebral disc degeneration, lumbar region: Secondary | ICD-10-CM | POA: Diagnosis not present

## 2018-05-17 DIAGNOSIS — M5416 Radiculopathy, lumbar region: Secondary | ICD-10-CM | POA: Diagnosis not present

## 2018-05-24 ENCOUNTER — Ambulatory Visit: Payer: 59

## 2018-05-28 ENCOUNTER — Encounter: Payer: Self-pay | Admitting: Internal Medicine

## 2018-06-03 ENCOUNTER — Encounter: Payer: Self-pay | Admitting: Gastroenterology

## 2018-06-03 ENCOUNTER — Ambulatory Visit: Payer: 59 | Admitting: Gastroenterology

## 2018-06-03 DIAGNOSIS — Z1211 Encounter for screening for malignant neoplasm of colon: Secondary | ICD-10-CM

## 2018-07-03 ENCOUNTER — Other Ambulatory Visit: Payer: Self-pay

## 2018-07-03 ENCOUNTER — Encounter: Payer: Self-pay | Admitting: Emergency Medicine

## 2018-07-03 ENCOUNTER — Ambulatory Visit
Admission: EM | Admit: 2018-07-03 | Discharge: 2018-07-03 | Disposition: A | Discharge status: Home / Self Care | Payer: 59 | Attending: Family Medicine | Admitting: Family Medicine

## 2018-07-03 DIAGNOSIS — Z720 Tobacco use: Secondary | ICD-10-CM | POA: Diagnosis not present

## 2018-07-03 DIAGNOSIS — R0789 Other chest pain: Secondary | ICD-10-CM | POA: Insufficient documentation

## 2018-07-03 DIAGNOSIS — F17210 Nicotine dependence, cigarettes, uncomplicated: Secondary | ICD-10-CM | POA: Diagnosis not present

## 2018-07-03 DIAGNOSIS — Z833 Family history of diabetes mellitus: Secondary | ICD-10-CM | POA: Insufficient documentation

## 2018-07-03 DIAGNOSIS — R7303 Prediabetes: Secondary | ICD-10-CM | POA: Insufficient documentation

## 2018-07-03 DIAGNOSIS — J45909 Unspecified asthma, uncomplicated: Secondary | ICD-10-CM | POA: Diagnosis not present

## 2018-07-03 DIAGNOSIS — Z79899 Other long term (current) drug therapy: Secondary | ICD-10-CM | POA: Insufficient documentation

## 2018-07-03 DIAGNOSIS — Z825 Family history of asthma and other chronic lower respiratory diseases: Secondary | ICD-10-CM | POA: Insufficient documentation

## 2018-07-03 DIAGNOSIS — R079 Chest pain, unspecified: Secondary | ICD-10-CM | POA: Diagnosis not present

## 2018-07-03 DIAGNOSIS — Z91013 Allergy to seafood: Secondary | ICD-10-CM | POA: Diagnosis not present

## 2018-07-03 NOTE — ED Provider Notes (Signed)
[facility]    CSN: [id number] Arrival date & time: 07/03/18  1750     History   Chief Complaint Chief Complaint  Patient presents with  . Chest Pain    HPI Teresa Deleon is a 52 y.o. female who presents today for acute onset of chest pain, tightness and scapular pain.  The patient denies any history of heart attack or stroke, the patient is a 1 pack/day smoker for the last 20 years.  She states that around 5 PM today she developed a pressure sensation in her chest which did radiate into her shoulder blade.  She continue to walk around and continue to perform her daily activities however she grew concerned of this discomfort and proceeded to show up to the urgent care for urgent evaluation.  She does report a history of acid reflux but denies any burning or recent ingestion of food.  She denies any abdominal pain.  Denies any numbness or tingling to bilateral upper extremities.  The patient states that she has been diagnosed with a murmur but has not been evaluated by a cardiologist.  She states that the pain is decreased when she is able to sit up straight.  At today's appointment she states that her pain is much improved, she denies having any chest tightness while in the room at today's appointment.  HPI  Past Medical History:  Diagnosis Date  . Asthma   . Back pain   . Chronic sinusitis   . GERD (gastroesophageal reflux disease)   . Heart murmur   . Leaky heart valve   . Migraine   . Migraine   . Trichomonas infection     Patient Active Problem List   Diagnosis Date Noted  . 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 Chronic sinusitis 04/04/2018  . Allergic rhinitis 04/04/2018  . Tobacco abuse 04/04/2018  . Migraine without status migrainosus, not intractable 04/04/2018  . Cardiac murmur 04/04/2018  . Gastroesophageal reflux disease 04/04/2018  . Prediabetes 04/04/2018  . Fibroids 04/04/2018    Past Surgical History:    Procedure Laterality Date  . BREAST SURGERY     left breast abcess    OB History   None      Home Medications    Prior to Admission medications   Medication Sig Start Date End Date Taking? Authorizing Provider  cyclobenzaprine (FLEXERIL) 5 MG tablet Take 1 tablet (5 mg total) by mouth at bedtime as needed for muscle spasms. 04/04/18  Yes McLean-Scocuzza, Nino Glow, MD  fluticasone (FLONASE) 50 MCG/ACT nasal spray Place 2 sprays into both nostrils daily. Prn max 2 sprays 04/04/18  Yes McLean-Scocuzza, Nino Glow, MD  gabapentin (NEURONTIN) 300 MG capsule TAKE 1 CAPSULE BY MOUTH AT BEDTIME FOR 4 DAYS THEN 1 CAPSULE TWICE DAILY AS TOLERATED. 06/08/18  Yes [provider]  loratadine (CLARITIN) 10 MG tablet Take 1 tablet (10 mg total) by mouth daily as needed for allergies. 04/04/18  Yes McLean-Scocuzza, Nino Glow, MD  montelukast (SINGULAIR) 10 MG tablet Take 1 tablet (10 mg total) by mouth at bedtime. 04/04/18  Yes McLean-Scocuzza, Nino Glow, MD  nicotine polacrilex (NICORETTE) 2 MG gum Take 1 each (2 mg total) by mouth as needed for smoking cessation. q1-2 hours x 6 weeks then q2-4 hours x 3 weeks then q4-8 hrs x 3 weeks. Max 24 pieces in 24 hours 04/04/18   McLean-Scocuzza, Nino Glow, MD    Family History Family History  Problem Relation Age  of Onset  . Asthma Mother   . COPD Mother   . Diabetes Mother   . Miscarriages / [place] Mother   . Diabetes Father   . Heart disease Father   . Hypertension Father   . Asthma Daughter   . Miscarriages / [place] Daughter   . Arthritis Maternal Grandmother   . Asthma Maternal Grandmother   . Hyperlipidemia Maternal Grandmother   . Diabetes Maternal Grandfather   . Stroke Paternal Grandmother   . Heart disease Brother     Social History Social History   Tobacco Use  . Smoking status: Current Some Day Smoker    Packs/day: 0.50    Types: Cigarettes  . Smokeless tobacco: Never Used  . Tobacco comment: 1 ppd   Substance Use Topics   . Alcohol use: Yes    Comment: occassionally  . Drug use: No     Allergies   Shellfish allergy   Review of Systems Review of Systems  Constitutional: Negative for diaphoresis.  HENT: Negative.   Eyes: Negative.   Respiratory: Positive for chest tightness and shortness of breath.   Cardiovascular: Positive for chest pain. Negative for palpitations and leg swelling.  All other systems reviewed and are negative.    Physical Exam Triage Vital Signs ED Triage Vitals  Enc Vitals Group     BP 07/03/18 1759 138/76     Pulse Rate 07/03/18 1759 97     Resp 07/03/18 1759 18     Temp 07/03/18 1759 98.5 F (36.9 C)     Temp Source 07/03/18 1759 Oral     SpO2 07/03/18 1759 97 %     Weight 07/03/18 1802 156 lb (70.8 kg)     Height 07/03/18 1802 5\' 1"  (1.549 m)     Head Circumference --      Peak Flow --      Pain Score 07/03/18 1801 8     Pain Loc --      Pain Edu? --      Excl. in [place]? --    No data found.  Updated Vital Signs BP 138/76 (BP Location: Right Arm)   Pulse 97   Temp 98.5 F (36.9 C) (Oral)   Resp 18   Ht 5\' 1"  (1.549 m)   Wt 156 lb (70.8 kg)   SpO2 97%   BMI 29.48 kg/m   Visual Acuity Right Eye Distance:   Left Eye Distance:   Bilateral Distance:    Right Eye Near:   Left Eye Near:    Bilateral Near:     Physical Exam  Constitutional: She appears well-developed and well-nourished. She is active.  Neck: No JVD present. Carotid bruit is not present.  Cardiovascular: Regular rhythm, S1 normal and S2 normal.  Murmur heard.  Systolic murmur is present with a grade of 1/6. Pulmonary/Chest: Effort normal and breath sounds normal. No stridor. No tachypnea. No respiratory distress. She has no decreased breath sounds. She has no wheezes. She has no rhonchi. She has no rales.  Abdominal:  Abdomen is soft with normal bowel sounds.  Neurological: She is alert.     UC Treatments / Results  Labs (all labs ordered are listed, but only abnormal results  are displayed) Labs Reviewed - No data to display  EKG EKG was obtained at today's appointment, EKG demonstrates a normal sinus rhythm.  Radiology No results found.  Procedures Procedures (including critical care time)  Medications Ordered in UC Medications - No data to display  Initial Impression / Assessment and Plan / UC Course  I have reviewed the triage vital signs and the nursing notes.  Pertinent labs & imaging results that were available during my care of the patient were reviewed by me and considered in my medical decision making (see chart for details).     The patient presents today with less than 1 day history of chest pain and pressure.  The patient states that her chest pain is much improved at today's visit.  EKG at today's visit demonstrates no acute abnormalities.  Vital signs are stable at today's visit.  No history of heart attack or stroke in her past.  The patient is adamant that she feels much improved when I entered the room, she is requesting to be discharged home.  Because vital signs are stable and imaging is encouraging, will discharge home with strict instructions to return to emergency room if chest tightness with pain returns.  Final Clinical Impressions(s) / UC Diagnoses   Final diagnoses:  Chest pressure     Discharge Instructions     -If pain returns or pressure returns proceed immediately to the emergency room for further evaluation. -Continue to work on quitting smoking.    ED Prescriptions    None     Controlled Substance Prescriptions [place] Controlled Substance Registry consulted? Not Applicable   Lattie Corns, PA-C 07/03/18 2017

## 2018-07-03 NOTE — Discharge Instructions (Addendum)
-  If pain returns or pressure returns proceed immediately to the emergency room for further evaluation. -Continue to work on quitting smoking.

## 2018-07-03 NOTE — ED Triage Notes (Addendum)
Patient in today c/o CP and pain between her shoulder blades x 20 minutes. Patient describes the pain as pressure. Patient has a heart murmur, but no other cardiac history. Patient has had some slight nausea. Patient does state that she has had a cough x 1 week. Patient states sitting up makes the pain better.

## 2018-07-19 ENCOUNTER — Encounter: Payer: Self-pay | Admitting: Gynecology

## 2018-07-19 ENCOUNTER — Ambulatory Visit
Admission: EM | Admit: 2018-07-19 | Discharge: 2018-07-19 | Disposition: A | Discharge status: Home / Self Care | Payer: 59 | Attending: Family Medicine | Admitting: Family Medicine

## 2018-07-19 DIAGNOSIS — R000 Tachycardia, unspecified: Secondary | ICD-10-CM

## 2018-07-19 DIAGNOSIS — J45909 Unspecified asthma, uncomplicated: Secondary | ICD-10-CM | POA: Diagnosis not present

## 2018-07-19 DIAGNOSIS — F17210 Nicotine dependence, cigarettes, uncomplicated: Secondary | ICD-10-CM | POA: Insufficient documentation

## 2018-07-19 DIAGNOSIS — R200 Anesthesia of skin: Secondary | ICD-10-CM | POA: Insufficient documentation

## 2018-07-19 DIAGNOSIS — K219 Gastro-esophageal reflux disease without esophagitis: Secondary | ICD-10-CM | POA: Insufficient documentation

## 2018-07-19 DIAGNOSIS — Z8249 Family history of ischemic heart disease and other diseases of the circulatory system: Secondary | ICD-10-CM | POA: Diagnosis not present

## 2018-07-19 DIAGNOSIS — Z79899 Other long term (current) drug therapy: Secondary | ICD-10-CM | POA: Diagnosis not present

## 2018-07-19 DIAGNOSIS — M79602 Pain in left arm: Secondary | ICD-10-CM | POA: Diagnosis not present

## 2018-07-19 DIAGNOSIS — Z833 Family history of diabetes mellitus: Secondary | ICD-10-CM | POA: Diagnosis not present

## 2018-07-19 DIAGNOSIS — R7303 Prediabetes: Secondary | ICD-10-CM | POA: Diagnosis not present

## 2018-07-19 DIAGNOSIS — R42 Dizziness and giddiness: Secondary | ICD-10-CM | POA: Diagnosis not present

## 2018-07-19 DIAGNOSIS — R110 Nausea: Secondary | ICD-10-CM | POA: Diagnosis not present

## 2018-07-19 DIAGNOSIS — M79601 Pain in right arm: Secondary | ICD-10-CM | POA: Insufficient documentation

## 2018-07-19 DIAGNOSIS — Z136 Encounter for screening for cardiovascular disorders: Secondary | ICD-10-CM | POA: Diagnosis not present

## 2018-07-19 MED ORDER — MECLIZINE HCL 25 MG PO TABS: 25.0000 mg | ORAL_TABLET | Freq: Three times a day (TID) | ORAL | 0 refills | Status: DC | PRN

## 2018-07-19 MED ORDER — ONDANSETRON HCL 4 MG PO TABS: 4.0000 mg | ORAL_TABLET | Freq: Three times a day (TID) | ORAL | 0 refills | Status: DC | PRN

## 2018-07-19 NOTE — Discharge Instructions (Signed)
Rest.  Medications as prescribed.  Take care  Dr. Nyelle Wolfson  

## 2018-07-19 NOTE — ED Triage Notes (Signed)
Pt. C/o pain in left arm  and dizziness x 3 days.

## 2018-07-19 NOTE — ED Provider Notes (Signed)
[facility]    CSN: [id number] Arrival date & time: 07/19/18  1708  History   Chief Complaint Chief Complaint  Patient presents with  . Arm Pain  . Dizziness   HPI  52 year old female presents with the above complaints.  Patient reports that her symptoms started on Christmas.  She reports nausea, arm pain, bilateral hand numbness, dizziness/vertigo.  Her symptoms have resolved as of today.  Patient was concerned about her symptoms.  She notes that she has been under an increased amount of stress.  Patient was recently seen here on 12/11 for evaluation of chest pain.  No exacerbating factors.  No other reported symptoms.  No other complaints or concerns at this time.  Past Medical History:  Diagnosis Date  . Asthma   . Back pain   . Chronic sinusitis   . GERD (gastroesophageal reflux disease)   . Heart murmur   . Leaky heart valve   . Migraine   . Migraine   . Trichomonas infection     Patient Active Problem List   Diagnosis Date Noted  . 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 Chronic sinusitis 04/04/2018  . Allergic rhinitis 04/04/2018  . Tobacco abuse 04/04/2018  . Migraine without status migrainosus, not intractable 04/04/2018  . Cardiac murmur 04/04/2018  . Gastroesophageal reflux disease 04/04/2018  . Prediabetes 04/04/2018  . Fibroids 04/04/2018    Past Surgical History:  Procedure Laterality Date  . BREAST SURGERY     left breast abcess    OB History   No obstetric history on file.      Home Medications    Prior to Admission medications   Medication Sig Start Date End Date Taking? Authorizing Provider  fluticasone (FLONASE) 50 MCG/ACT nasal spray Place 2 sprays into both nostrils daily. Prn max 2 sprays 04/04/18  Yes McLean-Scocuzza, Nino Glow, MD  gabapentin (NEURONTIN) 300 MG capsule TAKE 1 CAPSULE BY MOUTH AT BEDTIME FOR 4 DAYS THEN 1 CAPSULE TWICE DAILY AS TOLERATED. 06/08/18  Yes [provider]    loratadine (CLARITIN) 10 MG tablet Take 1 tablet (10 mg total) by mouth daily as needed for allergies. 04/04/18  Yes McLean-Scocuzza, Nino Glow, MD  montelukast (SINGULAIR) 10 MG tablet Take 1 tablet (10 mg total) by mouth at bedtime. 04/04/18  Yes McLean-Scocuzza, Nino Glow, MD  meclizine (ANTIVERT) 25 MG tablet Take 1 tablet (25 mg total) by mouth 3 (three) times daily as needed for dizziness. 07/19/18   Coral Spikes, DO  ondansetron (ZOFRAN) 4 MG tablet Take 1 tablet (4 mg total) by mouth every 8 (eight) hours as needed for nausea or vomiting. 07/19/18   Coral Spikes, DO    Family History Family History  Problem Relation Age of Onset  . Asthma Mother   . COPD Mother   . Diabetes Mother   . Miscarriages / [place] Mother   . Diabetes Father   . Heart disease Father   . Hypertension Father   . Asthma Daughter   . Miscarriages / [place] Daughter   . Arthritis Maternal Grandmother   . Asthma Maternal Grandmother   . Hyperlipidemia Maternal Grandmother   . Diabetes Maternal Grandfather   . Stroke Paternal Grandmother   . Heart disease Brother     Social History Social History   Tobacco Use  . Smoking status: Current Some Day Smoker    Packs/day: 0.50    Types: Cigarettes  . Smokeless tobacco: Never Used  .  Tobacco comment: 1 ppd   Substance Use Topics  . Alcohol use: Yes    Comment: occassionally  . Drug use: No     Allergies   Shellfish allergy   Review of Systems Review of Systems Per HPI  Physical Exam Triage Vital Signs ED Triage Vitals  Enc Vitals Group     BP 07/19/18 1802 (!) 119/93     Pulse Rate 07/19/18 1802 (!) 121     Resp 07/19/18 1802 16     Temp 07/19/18 1802 98.1 F (36.7 C)     Temp Source 07/19/18 1802 Oral     SpO2 07/19/18 1802 99 %     Weight 07/19/18 1803 156 lb (70.8 kg)     Height --      Head Circumference --      Peak Flow --      Pain Score 07/19/18 1803 8     Pain Loc --      Pain Edu? --      Excl. in [place]? --      Updated Vital Signs BP (!) 119/93 (BP Location: Left Arm)   Pulse (!) 121   Temp 98.1 F (36.7 C) (Oral)   Resp 16   Wt 70.8 kg   SpO2 99%   BMI 29.48 kg/m   Visual Acuity Right Eye Distance:   Left Eye Distance:   Bilateral Distance:    Right Eye Near:   Left Eye Near:    Bilateral Near:     Physical Exam Vitals signs and nursing note reviewed.  Constitutional:      General: She is not in acute distress. HENT:     Head: Normocephalic and atraumatic.  Eyes:     General:        Right eye: No discharge.        Left eye: No discharge.     Conjunctiva/sclera: Conjunctivae normal.     Pupils: Pupils are equal, round, and reactive to light.  Cardiovascular:     Rate and Rhythm: Regular rhythm. Tachycardia present.  Pulmonary:     Effort: Pulmonary effort is normal.     Breath sounds: No wheezing, rhonchi or rales.  Neurological:     General: No focal deficit present.     Mental Status: She is alert.  Psychiatric:        Mood and Affect: Mood normal.        Behavior: Behavior normal.    UC Treatments / Results  Labs (all labs ordered are listed, but only abnormal results are displayed) Labs Reviewed - No data to display  EKG Interpretation: Sinus tachycardia at the rate of 107.  Normal axis.  Biatrial enlargement.  No ST-T wave changes.   Radiology No results found.  Procedures Procedures (including critical care time)  Medications Ordered in UC Medications - No data to display  Initial Impression / Assessment and Plan / UC Course  I have reviewed the triage vital signs and the nursing notes.  Pertinent labs & imaging results that were available during my care of the patient were reviewed by me and considered in my medical decision making (see chart for details).    52 year old female presents with multiple complaints.  With a history of vertigo.  Endorsing nausea and dizziness.  EKG unrevealing.  Nonischemic.  I suspect anxiety is playing a role as  well.  Zofran and meclizine as needed.  Supportive care.  Final Clinical Impressions(s) / UC Diagnoses   Final diagnoses:  Dizziness  Nausea     Discharge Instructions     Rest.  Medications as prescribed.  Take care  Dr. Lacinda Axon    ED Prescriptions    Medication Sig Dispense Auth. Provider   ondansetron (ZOFRAN) 4 MG tablet Take 1 tablet (4 mg total) by mouth every 8 (eight) hours as needed for nausea or vomiting. 20 tablet Shuntell Foody G, DO   meclizine (ANTIVERT) 25 MG tablet Take 1 tablet (25 mg total) by mouth 3 (three) times daily as needed for dizziness. 30 tablet Coral Spikes, DO     Controlled Substance Prescriptions [place] Controlled Substance Registry consulted? Not Applicable   Coral Spikes, DO 07/20/18 [phone]

## 2018-11-03 ENCOUNTER — Emergency Department
Admission: EM | Admit: 2018-11-03 | Discharge: 2018-11-03 | Disposition: A | Discharge status: Home / Self Care | Payer: 59 | Attending: Emergency Medicine | Admitting: Emergency Medicine

## 2018-11-03 ENCOUNTER — Other Ambulatory Visit: Payer: Self-pay

## 2018-11-03 DIAGNOSIS — F17210 Nicotine dependence, cigarettes, uncomplicated: Secondary | ICD-10-CM | POA: Insufficient documentation

## 2018-11-03 DIAGNOSIS — Z79899 Other long term (current) drug therapy: Secondary | ICD-10-CM | POA: Insufficient documentation

## 2018-11-03 DIAGNOSIS — J45909 Unspecified asthma, uncomplicated: Secondary | ICD-10-CM | POA: Insufficient documentation

## 2018-11-03 DIAGNOSIS — M5441 Lumbago with sciatica, right side: Secondary | ICD-10-CM | POA: Insufficient documentation

## 2018-11-03 MED ORDER — KETOROLAC TROMETHAMINE 10 MG PO TABS: 10.0000 mg | ORAL_TABLET | Freq: Three times a day (TID) | ORAL | 0 refills | Status: DC | PRN

## 2018-11-03 MED ORDER — PREDNISONE 10 MG (21) PO TBPK: ORAL_TABLET | ORAL | 0 refills | Status: DC

## 2018-11-03 MED ORDER — PREDNISONE 20 MG PO TABS
60.0000 mg | ORAL_TABLET | Freq: Once | ORAL | Status: AC
  Administered 2018-11-03: 60 mg via ORAL
  Filled 2018-11-03: qty 3

## 2018-11-03 MED ORDER — KETOROLAC TROMETHAMINE 60 MG/2ML IM SOLN
60.0000 mg | Freq: Once | INTRAMUSCULAR | Status: AC
  Administered 2018-11-03: 60 mg via INTRAMUSCULAR
  Filled 2018-11-03: qty 2

## 2018-11-03 NOTE — ED Provider Notes (Signed)
[facility] Emergency Department Provider Note    ____________________________________________   I have reviewed the triage vital signs and the nursing notes.   HISTORY  Chief Complaint Leg Pain and Back Pain   History limited by: Not Limited   HPI Teresa Deleon is a 53 y.o. female who presents to the emergency department today because of concerns for right lower back and leg pain.  Patient states she has a history of a herniated disc and chronic pain.  She states she has had pain for the past 20 years.  For the past 3 days however it has been more severe.  She does take gabapentin normally for her pain which typically helps however tonight her pain was waking her from her sleep.  This is not as bad as her pain has been.  She denies any change in bowel or bladder habits.  Denies any trauma 3 days ago when the pain started although had a fall roughly 1 month ago which she wonders is causing part of this pain.  She denies any fevers.   Records reviewed. Per medical record review patient has a history of back pain.   Past Medical History:  Diagnosis Date  . Asthma   . Back pain   . Chronic sinusitis   . GERD (gastroesophageal reflux disease)   . Heart murmur   . Leaky heart valve   . Migraine   . Migraine   . Trichomonas infection     Patient Active Problem List   Diagnosis Date Noted  . 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 Chronic sinusitis 04/04/2018  . Allergic rhinitis 04/04/2018  . Tobacco abuse 04/04/2018  . Migraine without status migrainosus, not intractable 04/04/2018  . Cardiac murmur 04/04/2018  . Gastroesophageal reflux disease 04/04/2018  . Prediabetes 04/04/2018  . Fibroids 04/04/2018    Past Surgical History:  Procedure Laterality Date  . BREAST SURGERY     left breast abcess    Prior to Admission medications   Medication Sig Start Date End Date Taking? Authorizing Provider  fluticasone  (FLONASE) 50 MCG/ACT nasal spray Place 2 sprays into both nostrils daily. Prn max 2 sprays 04/04/18   McLean-Scocuzza, Nino Glow, MD  gabapentin (NEURONTIN) 300 MG capsule TAKE 1 CAPSULE BY MOUTH AT BEDTIME FOR 4 DAYS THEN 1 CAPSULE TWICE DAILY AS TOLERATED. 06/08/18   [provider]  loratadine (CLARITIN) 10 MG tablet Take 1 tablet (10 mg total) by mouth daily as needed for allergies. 04/04/18   McLean-Scocuzza, Nino Glow, MD  meclizine (ANTIVERT) 25 MG tablet Take 1 tablet (25 mg total) by mouth 3 (three) times daily as needed for dizziness. 07/19/18   Coral Spikes, DO  montelukast (SINGULAIR) 10 MG tablet Take 1 tablet (10 mg total) by mouth at bedtime. 04/04/18   McLean-Scocuzza, Nino Glow, MD  ondansetron (ZOFRAN) 4 MG tablet Take 1 tablet (4 mg total) by mouth every 8 (eight) hours as needed for nausea or vomiting. 07/19/18   Coral Spikes, DO    Allergies Shellfish allergy  Family History  Problem Relation Age of Onset  . Asthma Mother   . COPD Mother   . Diabetes Mother   . Miscarriages / [place] Mother   . Diabetes Father   . Heart disease Father   . Hypertension Father   . Asthma Daughter   . Miscarriages / [place] Daughter   . Arthritis Maternal Grandmother   . Asthma Maternal Grandmother   .  Hyperlipidemia Maternal Grandmother   . Diabetes Maternal Grandfather   . Stroke Paternal Grandmother   . Heart disease Brother     Social History Social History   Tobacco Use  . Smoking status: Current Some Day Smoker    Packs/day: 0.50    Types: Cigarettes  . Smokeless tobacco: Never Used  . Tobacco comment: 1 ppd   Substance Use Topics  . Alcohol use: Yes    Comment: occassionally  . Drug use: No    Review of Systems Constitutional: No fever/chills Eyes: No visual changes. ENT: No sore throat. Cardiovascular: Denies chest pain. Respiratory: Denies shortness of breath. Gastrointestinal: No abdominal pain.  No nausea, no vomiting.  No diarrhea.    Genitourinary: Negative for dysuria. Musculoskeletal: Positive for right lower back pain and leg pain.  Skin: Negative for rash. Neurological: Negative for headaches, focal weakness or numbness.  ____________________________________________   PHYSICAL EXAM:  VITAL SIGNS: ED Triage Vitals  Enc Vitals Group     BP 11/03/18 0324 (!) 155/68     Pulse Rate 11/03/18 0324 (!) 104     Resp 11/03/18 0324 20     Temp 11/03/18 0324 97.8 F (36.6 C)     Temp Source 11/03/18 0324 Oral     SpO2 11/03/18 0324 97 %     Weight 11/03/18 0326 145 lb (65.8 kg)     Height 11/03/18 0326 5' (1.524 m)     Head Circumference --      Peak Flow --      Pain Score 11/03/18 0324 10   Constitutional: Alert and oriented. Appears uncomfortable.  Eyes: Conjunctivae are normal.  ENT      Head: Normocephalic and atraumatic.      Nose: No congestion/rhinnorhea.      Mouth/Throat: Mucous membranes are moist.      Neck: No stridor. Hematological/Lymphatic/Immunilogical: No cervical lymphadenopathy. Cardiovascular: Normal rate, regular rhythm.  No murmurs, rubs, or gallops.  Respiratory: Normal respiratory effort without tachypnea nor retractions. Breath sounds are clear and equal bilaterally. No wheezes/rales/rhonchi. Gastrointestinal: Soft and non tender. No rebound. No guarding.  Genitourinary: Deferred Musculoskeletal: Normal range of motion in all extremities. No lower extremity edema. Neurologic:  Normal speech and language. No gross focal neurologic deficits are appreciated.  Skin:  Skin is warm, dry and intact. No rash noted. Psychiatric: Mood and affect are normal. Speech and behavior are normal. Patient exhibits appropriate insight and judgment.  ____________________________________________    LABS (pertinent positives/negatives)  None  ____________________________________________   EKG  None  ____________________________________________     RADIOLOGY  None  ____________________________________________   PROCEDURES  Procedures  ____________________________________________   INITIAL IMPRESSION / ASSESSMENT AND PLAN / ED COURSE  Pertinent labs & imaging results that were available during my care of the patient were reviewed by me and considered in my medical decision making (see chart for details).   Patient presented because of concern for acute on chronic right lower back pain. No bladder or bowel dysfunction, no weakness in lower extremities. At this point doubt cuada equina. Patient did feel better after medication. Will plan on discharging with prescription for steroids and toradol. Discussed return precautions and importance of follow up.   ____________________________________________   FINAL CLINICAL IMPRESSION(S) / ED DIAGNOSES  Final diagnoses:  Low back pain with right-sided sciatica, unspecified back pain laterality, unspecified chronicity     Note: This dictation was prepared with Dragon dictation. Any transcriptional errors that result from this process are unintentional  Nance Pear, MD 11/03/18 [phone]

## 2018-11-03 NOTE — Discharge Instructions (Addendum)
Please seek medical attention for any high fevers, chest pain, shortness of breath, change in behavior, persistent vomiting, bloody stool or any other new or concerning symptoms.  

## 2018-11-03 NOTE — ED Triage Notes (Signed)
Patient states that she has a herniated disk and sciatica. She further states that her back hurts worse when she walks and her leg pain increases when she is lying down. She states that the pain has been ongoing for about 2 weeks.

## 2018-11-03 NOTE — ED Notes (Signed)
Gave patient ginger-ale and [place] tray.AS

## 2018-11-03 NOTE — ED Notes (Signed)
Patient's appears to have a more steady gait than when she arrived.

## 2019-02-12 ENCOUNTER — Other Ambulatory Visit: Payer: Self-pay

## 2019-02-12 DIAGNOSIS — Z20822 Contact with and (suspected) exposure to covid-19: Secondary | ICD-10-CM

## 2019-02-12 NOTE — Addendum Note (Signed)
Addended by: Orland Mustard on: 02/12/2019 12:27 PM   Modules accepted: Orders

## 2019-02-16 ENCOUNTER — Telehealth: Payer: Self-pay | Admitting: Family Medicine

## 2019-02-16 LAB — NOVEL CORONAVIRUS, NAA: SARS-CoV-2, NAA: DETECTED — AB

## 2019-02-16 NOTE — Telephone Encounter (Signed)
On call note: Critical result called to me->COVID POSITIVE. Called pt to notify but got VM only.  No info in chart regarding Pt's instructions for sharing info or leaving message on VM. Therefore, no message about result was given. Pt still needs to be called to be notified of results.  Signed:  Crissie Sickles, MD           02/16/2019

## 2019-02-17 ENCOUNTER — Encounter: Payer: Self-pay | Admitting: *Deleted

## 2019-02-17 ENCOUNTER — Encounter: Payer: Self-pay | Admitting: Internal Medicine

## 2019-02-17 DIAGNOSIS — U071 COVID-19: Secondary | ICD-10-CM | POA: Insufficient documentation

## 2019-03-05 ENCOUNTER — Ambulatory Visit (INDEPENDENT_AMBULATORY_CARE_PROVIDER_SITE_OTHER): Payer: Self-pay | Admitting: Internal Medicine

## 2019-03-05 ENCOUNTER — Other Ambulatory Visit: Payer: Self-pay

## 2019-03-05 DIAGNOSIS — R059 Cough, unspecified: Secondary | ICD-10-CM

## 2019-03-05 DIAGNOSIS — R112 Nausea with vomiting, unspecified: Secondary | ICD-10-CM

## 2019-03-05 DIAGNOSIS — R197 Diarrhea, unspecified: Secondary | ICD-10-CM

## 2019-03-05 DIAGNOSIS — R05 Cough: Secondary | ICD-10-CM

## 2019-03-05 DIAGNOSIS — R6883 Chills (without fever): Secondary | ICD-10-CM

## 2019-03-05 DIAGNOSIS — U071 COVID-19: Secondary | ICD-10-CM

## 2019-03-05 HISTORY — DX: Nausea with vomiting, unspecified: R11.2

## 2019-03-05 MED ORDER — ALBUTEROL SULFATE HFA 108 (90 BASE) MCG/ACT IN AERS: 1.0000 | INHALATION_SPRAY | RESPIRATORY_TRACT | 2 refills | Status: DC | PRN

## 2019-03-05 MED ORDER — ONDANSETRON HCL 4 MG PO TABS: 4.0000 mg | ORAL_TABLET | Freq: Three times a day (TID) | ORAL | 0 refills | Status: DC | PRN

## 2019-03-05 MED ORDER — MUCINEX DM MAXIMUM STRENGTH 60-1200 MG PO TB12: 1.0000 | ORAL_TABLET | Freq: Two times a day (BID) | ORAL | 0 refills | Status: DC | PRN

## 2019-03-05 MED ORDER — ACETAMINOPHEN 500 MG PO TABS: 500.0000 mg | ORAL_TABLET | ORAL | 3 refills | Status: DC | PRN

## 2019-03-05 NOTE — Progress Notes (Signed)
Virtual Visit via Video Note  I connected with Teresa Deleon  on 03/05/19 at 11:40 AM EDT by a video enabled telemedicine application and verified that I am speaking with the correct person using two identifiers.  Location patient: home Location provider:work or home office Persons participating in the virtual visit: patient, provider, pts boyfriend Shaaron Adler  I discussed the limitations of evaluation and management by telemedicine and the availability of in person appointments. The patient expressed understanding and agreed to proceed.   HPI: Covid 19+ 02/16/19 still c/o n/v/d last stool 5x yesterday. She has lack of taste and smell and using more salt and pepper. She is also having cold chills , cough, h/a tried Tylenol 4-5 pills of 500 mg yesterday. She is feeling hot and cold and dizzy. Denies fever temp 97.4 to 99.9   ROS: See pertinent positives and negatives per HPI.  Past Medical History:  Diagnosis Date  . Asthma   . Back pain   . Chronic sinusitis   . GERD (gastroesophageal reflux disease)   . Heart murmur   . Leaky heart valve   . Migraine   . Migraine   . Trichomonas infection     Past Surgical History:  Procedure Laterality Date  . BREAST SURGERY     left breast abcess    Family History  Problem Relation Age of Onset  . Asthma Mother   . COPD Mother   . Diabetes Mother   . Miscarriages / [place] Mother   . Diabetes Father   . Heart disease Father   . Hypertension Father   . Asthma Daughter   . Miscarriages / [place] Daughter   . Arthritis Maternal Grandmother   . Asthma Maternal Grandmother   . Hyperlipidemia Maternal Grandmother   . Diabetes Maternal Grandfather   . Stroke Paternal Grandmother   . Heart disease Brother     SOCIAL HX: lives with boyfriend tim albright   Current Outpatient Medications:  .  fluticasone (FLONASE) 50 MCG/ACT nasal spray, Place 2 sprays into both nostrils daily. Prn max 2 sprays, Disp: 16 g, Rfl: 6 .   gabapentin (NEURONTIN) 300 MG capsule, TAKE 1 CAPSULE BY MOUTH AT BEDTIME FOR 4 DAYS THEN 1 CAPSULE TWICE DAILY AS TOLERATED., Disp: , Rfl: 1 .  ketorolac (TORADOL) 10 MG tablet, Take 1 tablet (10 mg total) by mouth every 8 (eight) hours as needed for severe pain., Disp: 20 tablet, Rfl: 0 .  loratadine (CLARITIN) 10 MG tablet, Take 1 tablet (10 mg total) by mouth daily as needed for allergies., Disp: 90 tablet, Rfl: 3 .  meclizine (ANTIVERT) 25 MG tablet, Take 1 tablet (25 mg total) by mouth 3 (three) times daily as needed for dizziness., Disp: 30 tablet, Rfl: 0 .  montelukast (SINGULAIR) 10 MG tablet, Take 1 tablet (10 mg total) by mouth at bedtime., Disp: 90 tablet, Rfl: 3 .  predniSONE (STERAPRED UNI-PAK 21 TAB) 10 MG (21) TBPK tablet, Per packaging instructions, Disp: 21 tablet, Rfl: 0 .  acetaminophen (TYLENOL) 500 MG tablet, Take 1 tablet (500 mg total) by mouth every 4 (four) hours as needed., Disp: 90 tablet, Rfl: 3 .  albuterol (VENTOLIN HFA) 108 (90 Base) MCG/ACT inhaler, Inhale 1-2 puffs into the lungs every 4 (four) hours as needed for wheezing or shortness of breath., Disp: 18 g, Rfl: 2 .  Dextromethorphan-guaiFENesin (MUCINEX DM MAXIMUM STRENGTH) 60-1200 MG TB12, Take 1 tablet by mouth 2 (two) times daily as needed., Disp: 40 tablet, Rfl: 0 .  ondansetron (ZOFRAN) 4 MG tablet, Take 1 tablet (4 mg total) by mouth every 8 (eight) hours as needed for nausea or vomiting., Disp: 40 tablet, Rfl: 0  EXAM:  VITALS per patient if applicable:  GENERAL: alert, oriented, appears well and in no acute distress  HEENT: atraumatic, conjunttiva clear, no obvious abnormalities on inspection of external nose and ears  NECK: normal movements of the head and neck  LUNGS: on inspection no signs of respiratory distress, breathing rate appears normal, no obvious gross SOB, gasping or wheezing  CV: no obvious cyanosis  MS: moves all visible extremities without noticeable abnormality  PSYCH/NEURO:  pleasant and cooperative, no obvious depression or anxiety, speech and thought processing grossly intact  ASSESSMENT AND PLAN:  Discussed the following assessment and plan:  Nausea and vomiting, intractability of vomiting not specified, unspecified vomiting type - Plan: ondansetron (ZOFRAN) 4 MG tablet  Diarrhea, unspecified type - Plan: brat diet, oral hydration   COVID-19 virus detected - Plan: acetaminophen (TYLENOL) 500 MG tablet, albuterol (VENTOLIN HFA) 108 (90 Base) MCG/ACT inhaler rec pt go to hospital already 2 weeks + and not feeling better rec she go today  Chills - Plan: she cant tolerate being warm with warmed blanket this if chronic for her and not new  Likely related to covid 19 +  Cough - Plan: Dextromethorphan-guaiFENesin (MUCINEX DM MAXIMUM STRENGTH) 60-1200 MG TB12, albuterol (VENTOLIN HFA) 108 (90 Base) MCG/ACT inhaler See above rec hospital currently she does not have insurance      I discussed the assessment and treatment plan with the patient. The patient was provided an opportunity to ask questions and all were answered. The patient agreed with the plan and demonstrated an understanding of the instructions.   The patient was advised to call back or seek an in-person evaluation if the symptoms worsen or if the condition fails to improve as anticipated.  Time spent 15 minutes Delorise Jackson, MD

## 2019-03-14 ENCOUNTER — Other Ambulatory Visit: Payer: Self-pay

## 2019-03-14 DIAGNOSIS — Z20822 Contact with and (suspected) exposure to covid-19: Secondary | ICD-10-CM

## 2019-03-15 LAB — NOVEL CORONAVIRUS, NAA: SARS-CoV-2, NAA: NOT DETECTED

## 2019-05-09 ENCOUNTER — Other Ambulatory Visit: Payer: Self-pay

## 2019-05-09 DIAGNOSIS — Z20822 Contact with and (suspected) exposure to covid-19: Secondary | ICD-10-CM

## 2019-05-11 LAB — NOVEL CORONAVIRUS, NAA: SARS-CoV-2, NAA: NOT DETECTED

## 2019-05-28 ENCOUNTER — Other Ambulatory Visit: Payer: Self-pay | Admitting: *Deleted

## 2019-05-28 DIAGNOSIS — Z20822 Contact with and (suspected) exposure to covid-19: Secondary | ICD-10-CM

## 2019-05-29 LAB — NOVEL CORONAVIRUS, NAA: SARS-CoV-2, NAA: NOT DETECTED

## 2019-06-22 ENCOUNTER — Emergency Department
Admission: EM | Admit: 2019-06-22 | Discharge: 2019-06-22 | Disposition: A | Discharge status: Home / Self Care | Payer: Self-pay | Attending: Emergency Medicine | Admitting: Emergency Medicine

## 2019-06-22 ENCOUNTER — Encounter: Payer: Self-pay | Admitting: Emergency Medicine

## 2019-06-22 ENCOUNTER — Other Ambulatory Visit: Payer: Self-pay

## 2019-06-22 ENCOUNTER — Emergency Department: Payer: Self-pay

## 2019-06-22 DIAGNOSIS — R200 Anesthesia of skin: Secondary | ICD-10-CM | POA: Insufficient documentation

## 2019-06-22 DIAGNOSIS — Y9289 Other specified places as the place of occurrence of the external cause: Secondary | ICD-10-CM | POA: Insufficient documentation

## 2019-06-22 DIAGNOSIS — Y93F2 Activity, caregiving, lifting: Secondary | ICD-10-CM | POA: Insufficient documentation

## 2019-06-22 DIAGNOSIS — R202 Paresthesia of skin: Secondary | ICD-10-CM | POA: Insufficient documentation

## 2019-06-22 DIAGNOSIS — S39012A Strain of muscle, fascia and tendon of lower back, initial encounter: Secondary | ICD-10-CM | POA: Insufficient documentation

## 2019-06-22 DIAGNOSIS — F17210 Nicotine dependence, cigarettes, uncomplicated: Secondary | ICD-10-CM | POA: Insufficient documentation

## 2019-06-22 DIAGNOSIS — Y990 Civilian activity done for income or pay: Secondary | ICD-10-CM | POA: Insufficient documentation

## 2019-06-22 DIAGNOSIS — X509XXA Other and unspecified overexertion or strenuous movements or postures, initial encounter: Secondary | ICD-10-CM | POA: Insufficient documentation

## 2019-06-22 MED ORDER — METHOCARBAMOL 500 MG PO TABS: 500.0000 mg | ORAL_TABLET | Freq: Four times a day (QID) | ORAL | 0 refills | Status: DC

## 2019-06-22 MED ORDER — ORPHENADRINE CITRATE 30 MG/ML IJ SOLN
60.0000 mg | Freq: Once | INTRAMUSCULAR | Status: AC
  Administered 2019-06-22: 60 mg via INTRAMUSCULAR
  Filled 2019-06-22: qty 2

## 2019-06-22 MED ORDER — KETOROLAC TROMETHAMINE 30 MG/ML IJ SOLN
30.0000 mg | Freq: Once | INTRAMUSCULAR | Status: AC
  Administered 2019-06-22: 30 mg via INTRAMUSCULAR
  Filled 2019-06-22: qty 1

## 2019-06-22 MED ORDER — MELOXICAM 15 MG PO TABS: 15.0000 mg | ORAL_TABLET | Freq: Every day | ORAL | 0 refills | Status: DC

## 2019-06-22 NOTE — ED Triage Notes (Signed)
Pt presents to ED via POV with c/o lower back pain. Pt states hx of back problems. 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 Pt ambulatory to the desk at this time.   Pt states unknown if filing as WC at this time.

## 2019-06-22 NOTE — ED Provider Notes (Signed)
[facility] Emergency Department Provider Note  ____________________________________________  Time seen: Approximately 9:42 PM  I have reviewed the triage vital signs and the nursing notes.   HISTORY  Chief Complaint Back Pain    HPI Teresa Deleon is a 53 y.o. female who presents the emergency department complaining of lower back pain radiating the left leg.  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  This injury occurred 5 days ago when she has had progressively worsening back pain since.  Patient does have some numbness and tingling running down the left leg.  Patient states that in the first encounter she was lifting a patient with an amputation who was unable to help much.  Patient was also lifting a second patient was obese when that patient had a secondary fall while she was holding onto them.  This caused a jerking motion and cause most of the patient's lower back pain.  Patient denies any other injury or complaint at this time.  No urinary or GI complaints.  Patient states that she takes gabapentin for her back pain but this has not been improving her symptoms.  No other complaints at this time.         Past Medical History:  Diagnosis Date  . Asthma   . Back pain   . Chronic sinusitis   . GERD (gastroesophageal reflux disease)   . Heart murmur   . Leaky heart valve   . Migraine   . Migraine   . Trichomonas infection     Patient Active Problem List   Diagnosis Date Noted  . Diarrhea 03/05/2019  . Nausea and vomiting 03/05/2019  . COVID-19 virus detected 02/17/2019  . 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 Chronic sinusitis 04/04/2018  . Allergic rhinitis 04/04/2018  . Tobacco abuse 04/04/2018  . Migraine without status migrainosus, not intractable 04/04/2018  . Cardiac murmur 04/04/2018  . Gastroesophageal reflux disease 04/04/2018   . Prediabetes 04/04/2018  . Fibroids 04/04/2018    Past Surgical History:  Procedure Laterality Date  . BREAST SURGERY     left breast abcess    Prior to Admission medications   Medication Sig Start Date End Date Taking? Authorizing Provider  acetaminophen (TYLENOL) 500 MG tablet Take 1 tablet (500 mg total) by mouth every 4 (four) hours as needed. 03/05/19   McLean-Scocuzza, Nino Glow, MD  albuterol (VENTOLIN HFA) 108 (90 Base) MCG/ACT inhaler Inhale 1-2 puffs into the lungs every 4 (four) hours as needed for wheezing or shortness of breath. 03/05/19   McLean-Scocuzza, Nino Glow, MD  Dextromethorphan-guaiFENesin (MUCINEX DM MAXIMUM STRENGTH) 60-1200 MG TB12 Take 1 tablet by mouth 2 (two) times daily as needed. 03/05/19   McLean-Scocuzza, Nino Glow, MD  fluticasone (FLONASE) 50 MCG/ACT nasal spray Place 2 sprays into both nostrils daily. Prn max 2 sprays 04/04/18   McLean-Scocuzza, Nino Glow, MD  gabapentin (NEURONTIN) 300 MG capsule TAKE 1 CAPSULE BY MOUTH AT BEDTIME FOR 4 DAYS THEN 1 CAPSULE TWICE DAILY AS TOLERATED. 06/08/18   [provider]  ketorolac (TORADOL) 10 MG tablet Take 1 tablet (10 mg total) by mouth every 8 (eight) hours as needed for severe pain. 11/03/18   Nance Pear, MD  loratadine (CLARITIN) 10 MG tablet Take 1 tablet (10 mg total) by mouth daily as needed for allergies. 04/04/18   McLean-Scocuzza, Nino Glow, MD  meclizine (ANTIVERT) 25 MG  tablet Take 1 tablet (25 mg total) by mouth 3 (three) times daily as needed for dizziness. 07/19/18   Coral Spikes, DO  meloxicam (MOBIC) 15 MG tablet Take 1 tablet (15 mg total) by mouth daily. 06/22/19   Sway Guttierrez, Charline Bills, PA-C  methocarbamol (ROBAXIN) 500 MG tablet Take 1 tablet (500 mg total) by mouth 4 (four) times daily. 06/22/19   Quayshaun Hubbert, Charline Bills, PA-C  montelukast (SINGULAIR) 10 MG tablet Take 1 tablet (10 mg total) by mouth at bedtime. 04/04/18   McLean-Scocuzza, Nino Glow, MD  ondansetron (ZOFRAN) 4 MG tablet Take 1  tablet (4 mg total) by mouth every 8 (eight) hours as needed for nausea or vomiting. 03/05/19   McLean-Scocuzza, Nino Glow, MD  predniSONE (STERAPRED UNI-PAK 21 TAB) 10 MG (21) TBPK tablet Per packaging instructions 11/03/18   Nance Pear, MD    Allergies Shellfish allergy  Family History  Problem Relation Age of Onset  . Asthma Mother   . COPD Mother   . Diabetes Mother   . Miscarriages / [place] Mother   . Diabetes Father   . Heart disease Father   . Hypertension Father   . Asthma Daughter   . Miscarriages / [place] Daughter   . Arthritis Maternal Grandmother   . Asthma Maternal Grandmother   . Hyperlipidemia Maternal Grandmother   . Diabetes Maternal Grandfather   . Stroke Paternal Grandmother   . Heart disease Brother     Social History Social History   Tobacco Use  . Smoking status: Current Some Day Smoker    Packs/day: 0.50    Types: Cigarettes  . Smokeless tobacco: Never Used  . Tobacco comment: 1 ppd   Substance Use Topics  . Alcohol use: Yes    Comment: occassionally  . Drug use: No     Review of Systems  Constitutional: No fever/chills Eyes: No visual changes. No discharge ENT: No upper respiratory complaints. Cardiovascular: no chest pain. Respiratory: no cough. No SOB. Gastrointestinal: No abdominal pain.  No nausea, no vomiting.  No diarrhea.  No constipation. Genitourinary: Negative for dysuria. No hematuria Musculoskeletal: Lower back pain radiating down the left leg Skin: Negative for rash, abrasions, lacerations, ecchymosis. Neurological: Negative for headaches, focal weakness or numbness. 10-point ROS otherwise negative.  ____________________________________________   PHYSICAL EXAM:  VITAL SIGNS: ED Triage Vitals  Enc Vitals Group     BP 06/22/19 2021 (!) 156/82     Pulse Rate 06/22/19 2021 99     Resp 06/22/19 2021 18     Temp 06/22/19 2021 99.2 F (37.3 C)     Temp Source 06/22/19 2021 Oral     SpO2 06/22/19 2021 97 %      Weight 06/22/19 2018 140 lb (63.5 kg)     Height 06/22/19 2018 5' (1.524 m)     Head Circumference --      Peak Flow --      Pain Score 06/22/19 2018 9     Pain Loc --      Pain Edu? --      Excl. in [place]? --      Constitutional: Alert and oriented. Well appearing and in no acute distress. Eyes: Conjunctivae are normal. PERRL. EOMI. Head: Atraumatic. Neck: No stridor.  No cervical spine tenderness to palpation.  Cardiovascular: Normal rate, regular rhythm. Normal S1 and S2.  Good peripheral circulation. Respiratory: Normal respiratory effort without tachypnea or retractions. Lungs CTAB. Good air entry to the bases with no decreased or absent breath  sounds. Gastrointestinal: Bowel sounds 4 quadrants. Soft and nontender to palpation. No guarding or rigidity. No palpable masses. No distention. No CVA tenderness. Musculoskeletal: Full range of motion to all extremities. No gross deformities appreciated.  Visualization of the lower lumbar spine reveals no visible abnormality.  Patient is nontender to palpation midline and right paraspinal muscle region.  Patient is tender to palpation in the left paraspinal muscle region.  No tenderness to palpation over bilateral sciatic notches.  Dorsalis pedis pulse intact bilateral lower extremities.  Sensation intact and equal bilateral lower extremities. Neurologic:  Normal speech and language. No gross focal neurologic deficits are appreciated.  Skin:  Skin is warm, dry and intact. No rash noted. Psychiatric: Mood and affect are normal. Speech and behavior are normal. Patient exhibits appropriate insight and judgement.   ____________________________________________   LABS (all labs ordered are listed, but only abnormal results are displayed)  Labs Reviewed - No data to display ____________________________________________  EKG   ____________________________________________  RADIOLOGY I personally viewed and evaluated these images as part of  my medical decision making, as well as reviewing the written report by the radiologist.  Dg Lumbar Spine 2-3 Views  Result Date: 06/22/2019 CLINICAL DATA:  Lower back pain after injury 5 days ago EXAM: LUMBAR SPINE - 2-3 VIEW COMPARISON:  February 15, 2017 FINDINGS: There is no evidence of lumbar spine fracture. Alignment is normal. Intervertebral disc spaces are maintained. Scattered vascular calcifications. IMPRESSION: Negative. Electronically Signed   By: Prudencio Pair M.D.   On: 06/22/2019 22:04    ____________________________________________    PROCEDURES  Procedure(s) performed:    Procedures    Medications  ketorolac (TORADOL) 30 MG/ML injection 30 mg (has no administration in time range)  orphenadrine (NORFLEX) injection 60 mg (has no administration in time range)     ____________________________________________   INITIAL IMPRESSION / ASSESSMENT AND PLAN / ED COURSE  Pertinent labs & imaging results that were available during my care of the patient were reviewed by me and considered in my medical decision making (see chart for details).  Review of the [place] CSRS was performed in accordance of the [place] prior to dispensing any controlled drugs.           Patient's diagnosis is consistent with lumbar strain.  Patient presented to emergency department with lower back pain after lifting to patients at work.  Overall exam was reassuring.  Imaging reveals no acute osseous abnormality.  Patient will be started on anti-inflammatory muscle relaxer for symptom relief.  Patient is given Toradol and Norflex here in the emergency department and will prescribe meloxicam and Robaxin at home.  Follow-up primary care as needed..  Patient is given ED precautions to return to the ED for any worsening or new symptoms.     ____________________________________________  FINAL CLINICAL IMPRESSION(S) / ED DIAGNOSES  Final diagnoses:  Strain of lumbar region, initial encounter      NEW  MEDICATIONS STARTED DURING THIS VISIT:  ED Discharge Orders         Ordered    meloxicam (MOBIC) 15 MG tablet  Daily     06/22/19 2230    methocarbamol (ROBAXIN) 500 MG tablet  4 times daily     06/22/19 2230              This chart was dictated using voice recognition software/Dragon. Despite best efforts to proofread, errors can occur which can change the meaning. Any change was purely unintentional.    Duron Meister, Charline Bills, PA-C  06/22/19 2231    Blake Divine, MD 06/23/19 0001

## 2019-06-22 NOTE — ED Notes (Signed)
Pt stating pain in lower back 10/10 and going down left leg since lifting pt's off floor at her job at [employer] last Tuesday.

## 2019-06-22 NOTE — ED Notes (Signed)
Pt waiting for 10 minutes after IM injections for discharge

## 2019-06-24 ENCOUNTER — Other Ambulatory Visit: Payer: Self-pay

## 2019-06-24 ENCOUNTER — Ambulatory Visit (INDEPENDENT_AMBULATORY_CARE_PROVIDER_SITE_OTHER): Payer: Self-pay | Admitting: Internal Medicine

## 2019-06-24 ENCOUNTER — Encounter: Payer: Self-pay | Admitting: Internal Medicine

## 2019-06-24 VITALS — Ht 60.0 in | Wt 140.0 lb

## 2019-06-24 DIAGNOSIS — Z1231 Encounter for screening mammogram for malignant neoplasm of breast: Secondary | ICD-10-CM

## 2019-06-24 DIAGNOSIS — F329 Major depressive disorder, single episode, unspecified: Secondary | ICD-10-CM

## 2019-06-24 DIAGNOSIS — R7303 Prediabetes: Secondary | ICD-10-CM

## 2019-06-24 DIAGNOSIS — M5126 Other intervertebral disc displacement, lumbar region: Secondary | ICD-10-CM

## 2019-06-24 DIAGNOSIS — Z1389 Encounter for screening for other disorder: Secondary | ICD-10-CM

## 2019-06-24 DIAGNOSIS — M5416 Radiculopathy, lumbar region: Secondary | ICD-10-CM

## 2019-06-24 DIAGNOSIS — Z13220 Encounter for screening for lipoid disorders: Secondary | ICD-10-CM

## 2019-06-24 DIAGNOSIS — Z1329 Encounter for screening for other suspected endocrine disorder: Secondary | ICD-10-CM

## 2019-06-24 DIAGNOSIS — R937 Abnormal findings on diagnostic imaging of other parts of musculoskeletal system: Secondary | ICD-10-CM

## 2019-06-24 DIAGNOSIS — G4700 Insomnia, unspecified: Secondary | ICD-10-CM | POA: Insufficient documentation

## 2019-06-24 DIAGNOSIS — F32A Depression, unspecified: Secondary | ICD-10-CM

## 2019-06-24 DIAGNOSIS — E559 Vitamin D deficiency, unspecified: Secondary | ICD-10-CM

## 2019-06-24 DIAGNOSIS — R102 Pelvic and perineal pain: Secondary | ICD-10-CM | POA: Insufficient documentation

## 2019-06-24 DIAGNOSIS — Z0000 Encounter for general adult medical examination without abnormal findings: Secondary | ICD-10-CM

## 2019-06-24 MED ORDER — METHOCARBAMOL 500 MG PO TABS: 500.0000 mg | ORAL_TABLET | Freq: Two times a day (BID) | ORAL | 0 refills | Status: DC | PRN

## 2019-06-24 MED ORDER — TRAZODONE HCL 50 MG PO TABS: 25.0000 mg | ORAL_TABLET | Freq: Every evening | ORAL | 0 refills | Status: DC | PRN

## 2019-06-24 MED ORDER — OXYCODONE-ACETAMINOPHEN 5-325 MG PO TABS: 1.0000 | ORAL_TABLET | Freq: Two times a day (BID) | ORAL | 0 refills | Status: DC | PRN

## 2019-06-24 NOTE — Progress Notes (Signed)
Telephone Note  I connected with Teresa Deleon on 06/24/19 at  1:30 PM EST by telephone and verified that I am speaking with the correct person using two identifiers.  Location patient: home Location provider:work or home office Persons participating in the virtual visit: patient, provider  I discussed the limitations of evaluation and management by telemedicine and the availability of in person appointments. The patient expressed understanding and agreed to proceed.   HPI: 1. Insomnia ? Depression 3 people including her father unexpectedly died since 08/22/2018 sleeping 3 hours at at time   2. Herniated disc with right radiculopathy noted on MRI 03/2018 saw dr. Sharlet Salina and rec f/u but currently w/o insurance x 1 month  ED visit 06/22/19 for sx's legs giving out low back pain and numbness Xray normal.  Right foot numbness and left giving out x 2 x with at work and crossing her legs causes her feet and legs to go numb. Pain is 8-9/10 not tried robaxin yet and gabapentin 300 mg qhs is helping. mobic did not help in the past  Back worse after 2 residents fell in flore and she had to pull them up. Xray 06/22/19 negative but had abnormal low back MRI 03/2018 with disc bulging.   3. Lower female ab pain with h/o fibroids and uterine bx negative 05/02/18 [facility] Dr. Georgianne Fick   ROS: See pertinent positives and negatives per HPI.  Past Medical History:  Diagnosis Date  . Asthma   . Back pain   . Chronic sinusitis   . GERD (gastroesophageal reflux disease)   . Heart murmur   . Leaky heart valve   . Migraine   . Migraine   . Trichomonas infection     Past Surgical History:  Procedure Laterality Date  . BREAST SURGERY     left breast abcess    Family History  Problem Relation Age of Onset  . Asthma Mother   . COPD Mother   . Diabetes Mother   . Miscarriages / [place] Mother   . Diabetes Father   . Heart disease Father   . Hypertension Father   . Asthma Daughter   . Miscarriages  / [place] Daughter   . Arthritis Maternal Grandmother   . Asthma Maternal Grandmother   . Hyperlipidemia Maternal Grandmother   . Diabetes Maternal Grandfather   . Stroke Paternal Grandmother   . Heart disease Brother     SOCIAL HX:  Lives with boyfriend    Current Outpatient Medications:  .  acetaminophen (TYLENOL) 500 MG tablet, Take 1 tablet (500 mg total) by mouth every 4 (four) hours as needed., Disp: 90 tablet, Rfl: 3 .  fluticasone (FLONASE) 50 MCG/ACT nasal spray, Place 2 sprays into both nostrils daily. Prn max 2 sprays, Disp: 16 g, Rfl: 6 .  gabapentin (NEURONTIN) 300 MG capsule, TAKE 1 CAPSULE BY MOUTH AT BEDTIME FOR 4 DAYS THEN 1 CAPSULE TWICE DAILY AS TOLERATED., Disp: , Rfl: 1 .  ketorolac (TORADOL) 10 MG tablet, Take 1 tablet (10 mg total) by mouth every 8 (eight) hours as needed for severe pain., Disp: 20 tablet, Rfl: 0 .  loratadine (CLARITIN) 10 MG tablet, Take 1 tablet (10 mg total) by mouth daily as needed for allergies., Disp: 90 tablet, Rfl: 3 .  meclizine (ANTIVERT) 25 MG tablet, Take 1 tablet (25 mg total) by mouth 3 (three) times daily as needed for dizziness., Disp: 30 tablet, Rfl: 0 .  montelukast (SINGULAIR) 10 MG tablet, Take 1 tablet (10 mg total)  by mouth at bedtime., Disp: 90 tablet, Rfl: 3 .  ondansetron (ZOFRAN) 4 MG tablet, Take 1 tablet (4 mg total) by mouth every 8 (eight) hours as needed for nausea or vomiting., Disp: 40 tablet, Rfl: 0 .  albuterol (VENTOLIN HFA) 108 (90 Base) MCG/ACT inhaler, Inhale 1-2 puffs into the lungs every 4 (four) hours as needed for wheezing or shortness of breath. (Patient not taking: Reported on 06/24/2019), Disp: 18 g, Rfl: 2 .  methocarbamol (ROBAXIN) 500 MG tablet, Take 1 tablet (500 mg total) by mouth 2 (two) times daily as needed for muscle spasms., Disp: 60 tablet, Rfl: 0 .  oxyCODONE-acetaminophen (PERCOCET) 5-325 MG tablet, Take 1 tablet by mouth 2 (two) times daily as needed for severe pain., Disp: 10 tablet, Rfl:  0 .  traZODone (DESYREL) 50 MG tablet, Take 0.5-1 tablets (25-50 mg total) by mouth at bedtime as needed for sleep., Disp: 30 tablet, Rfl: 0  EXAM:  VITALS per patient if applicable:  GENERAL: alert, oriented, appears well and in no acute distress  HEENT: atraumatic, conjunttiva clear, no obvious abnormalities on inspection of external nose and ears  NECK: normal movements of the head and neck  LUNGS: on inspection no signs of respiratory distress, breathing rate appears normal, no obvious gross SOB, gasping or wheezing  CV: no obvious cyanosis  MS: moves all visible extremities without noticeable abnormality  PSYCH/NEURO: pleasant and cooperative, no obvious depression or anxiety, speech and thought processing grossly intact  ASSESSMENT AND PLAN:  Discussed the following assessment and plan:  Lumbar herniated disc with lumbar radiculopathy - Plan: methocarbamol (ROBAXIN) 500 MG tablet, oxyCODONE-acetaminophen (PERCOCET) 5-325 MG tablet -refer to [facility] Dr. Lacinda Axon and Dr. Sharlet Salina  -not for work out until 06/30/2019   Insomnia with depression - Plan: traZODone (DESYREL) 50 MG tablet qhs prn Disc otc melatonin 5 mg qhs prn   Female pelvic pain -encouraged pt to f/u [facility] if continues   Depression, unspecified depression type  HM sch fasting labs before next appt  Flu shot last in 2019  Tdap ? Date review prior records  Consider MMR vaccine and check hep B/C status  Consider shingirx   Per pt had HIV test [facility] requested records today   Referred mammogram prev never had reordered order about to expire   04/25/18 pap negative pap also h/o fibroids, enlarged cervix, adenaxal mass, trichomonas, mild endometrial thickening referred to Dr. Georgianne Fick bx endometrium neg 05/02/18  Colonoscopy never had disc prev  Smoker 1ppd Rx nicotine gum today pt wants to try to quit    -we discussed possible serious and likely etiologies, options for evaluation and  workup, limitations of telemedicine visit vs in person visit, treatment, treatment risks and precautions. Pt prefers to treat via telemedicine empirically rather then risking or undertaking an in person visit at this moment. Patient agrees to seek prompt in person care if worsening, new symptoms arise, or if is not improving with treatment.   I discussed the assessment and treatment plan with the patient. The patient was provided an opportunity to ask questions and all were answered. The patient agreed with the plan and demonstrated an understanding of the instructions.   The patient was advised to call back or seek an in-person evaluation if the symptoms worsen or if the condition fails to improve as anticipated.  Time spent 20 minutes  Delorise Jackson, MD

## 2019-06-24 NOTE — Progress Notes (Signed)
Patient said that she hasn't been sleeping since her father passed away in 09-15-2022.  Pt requesting sleep medication.  Pt says that she maybe gets 3 hours of sleep a night.    Pt said that she went to the ER the other night because of a work-related back injury on last Tuesday.  Pt said that she had an X-ray in ER.  Pt previously had a herniated disc but she had an additional injury on the job recently.  Patient also reporting pain in her stomach.  Patient reporting numbness in legs and feet.  Pt rates back pain today a 9.  Patient said that she had returned to work for a few days after injury but left and has been out of work since Saturday.    Patient may need a work note.

## 2019-06-24 NOTE — Patient Instructions (Signed)
Back Exercises The following exercises strengthen the muscles that help to support the trunk and back. They also help to keep the lower back flexible. Doing these exercises can help to prevent back pain or lessen existing pain.  If you have back pain or discomfort, try doing these exercises 2-3 times each day or as told by your health care provider.  As your pain improves, do them once each day, but increase the number of times that you repeat the steps for each exercise (do more repetitions).  To prevent the recurrence of back pain, continue to do these exercises once each day or as told by your health care provider. Do exercises exactly as told by your health care provider and adjust them as directed. It is normal to feel mild stretching, pulling, tightness, or discomfort as you do these exercises, but you should stop right away if you feel sudden pain or your pain gets worse. Exercises Single knee to chest Repeat these steps 3-5 times for each leg: 1. Lie on your back on a firm bed or the floor with your legs extended. 2. Bring one knee to your chest. Your other leg should stay extended and in contact with the floor. 3. Hold your knee in place by grabbing your knee or thigh with both hands and hold. 4. Pull on your knee until you feel a gentle stretch in your lower back or buttocks. 5. Hold the stretch for 10-30 seconds. 6. Slowly release and straighten your leg. Pelvic tilt Repeat these steps 5-10 times: 1. Lie on your back on a firm bed or the floor with your legs extended. 2. Bend your knees so they are pointing toward the ceiling and your feet are flat on the floor. 3. Tighten your lower abdominal muscles to press your lower back against the floor. This motion will tilt your pelvis so your tailbone points up toward the ceiling instead of pointing to your feet or the floor. 4. With gentle tension and even breathing, hold this position for 5-10 seconds. Cat-cow Repeat these steps until  your lower back becomes more flexible: 1. Get into a hands-and-knees position on a firm surface. Keep your hands under your shoulders, and keep your knees under your hips. You may place padding under your knees for comfort. 2. Let your head hang down toward your chest. Contract your abdominal muscles and point your tailbone toward the floor so your lower back becomes rounded like the back of a cat. 3. Hold this position for 5 seconds. 4. Slowly lift your head, let your abdominal muscles relax and point your tailbone up toward the ceiling so your back forms a sagging arch like the back of a cow. 5. Hold this position for 5 seconds.  Press-ups Repeat these steps 5-10 times: 1. Lie on your abdomen (face-down) on the floor. 2. Place your palms near your head, about shoulder-width apart. 3. Keeping your back as relaxed as possible and keeping your hips on the floor, slowly straighten your arms to raise the top half of your body and lift your shoulders. Do not use your back muscles to raise your upper torso. You may adjust the placement of your hands to make yourself more comfortable. 4. Hold this position for 5 seconds while you keep your back relaxed. 5. Slowly return to lying flat on the floor.  Bridges Repeat these steps 10 times: 1. Lie on your back on a firm surface. 2. Bend your knees so they are pointing toward the ceiling and   your feet are flat on the floor. Your arms should be flat at your sides, next to your body. 3. Tighten your buttocks muscles and lift your buttocks off the floor until your waist is at almost the same height as your knees. You should feel the muscles working in your buttocks and the back of your thighs. If you do not feel these muscles, slide your feet 1-2 inches farther away from your buttocks. 4. Hold this position for 3-5 seconds. 5. Slowly lower your hips to the starting position, and allow your buttocks muscles to relax completely. If this exercise is too easy, try  doing it with your arms crossed over your chest. Abdominal crunches Repeat these steps 5-10 times: 1. Lie on your back on a firm bed or the floor with your legs extended. 2. Bend your knees so they are pointing toward the ceiling and your feet are flat on the floor. 3. Cross your arms over your chest. 4. Tip your chin slightly toward your chest without bending your neck. 5. Tighten your abdominal muscles and slowly raise your trunk (torso) high enough to lift your shoulder blades a tiny bit off the floor. Avoid raising your torso higher than that because it can put too much stress on your low back and does not help to strengthen your abdominal muscles. 6. Slowly return to your starting position. Back lifts Repeat these steps 5-10 times: 1. Lie on your abdomen (face-down) with your arms at your sides, and rest your forehead on the floor. 2. Tighten the muscles in your legs and your buttocks. 3. Slowly lift your chest off the floor while you keep your hips pressed to the floor. Keep the back of your head in line with the curve in your back. Your eyes should be looking at the floor. 4. Hold this position for 3-5 seconds. 5. Slowly return to your starting position. Contact a health care provider if:  Your back pain or discomfort gets much worse when you do an exercise.  Your worsening back pain or discomfort does not lessen within 2 hours after you exercise. If you have any of these problems, stop doing these exercises right away. Do not do them again unless your health care provider says that you can. Get help right away if:  You develop sudden, severe back pain. If this happens, stop doing the exercises right away. Do not do them again unless your health care provider says that you can. This information is not intended to replace advice given to you by your health care provider. Make sure you discuss any questions you have with your health care provider. Document Released: 08/17/2004 Document  Revised: 11/14/2018 Document Reviewed: 04/11/2018 Elsevier Patient Education  [employer] [place].  Herniated Disk  A herniated disk, also called a ruptured disk or slipped disk, occurs when a disk in the spine bulges out too far. Between the bones in the spine (vertebrae), there are oval disks that are made of a soft, spongy center that is surrounded by a tough outer ring. The disks connect your vertebrae, help your spine move, and absorb shocks from your movement. When you have a herniated disk, the spongy center of the disk bulges out or breaks through the outer ring. It can press on a nerve between the vertebrae and cause pain. This can occur anywhere in the back or neck area, but the lower back is most commonly affected. What are the causes? This condition may be caused by:  Age-related wear and  tear. The spongy centers of spinal disks tend to shrink and dry out with age, which makes them more likely to herniate.  Sudden injury, such as a strain or sprain. What increases the risk? Aging is the main risk factor for a herniated disk. Other risk factors include:  Being a man who is 29-63 years old.  Frequently doing activities that involve heavy lifting, bending, or twisting.  Frequently driving for long hours at a time.  Not getting enough exercise.  Being overweight.  Smoking.  Having a family history of back problems or herniated disks.  Being pregnant or giving birth.  Having poor nutrition.  Being tall. What are the signs or symptoms? Symptoms may vary depending on where your herniated disk is located.  A herniated disk in the lower back may cause sharp pain in: ? Part of the arm, leg, hip, or buttocks. ? The back of the lower leg (calf). ? The lower back, spreading down through the leg into the foot (sciatica).  A herniated disk in the neck may cause dizziness and vertigo. It may also cause pain or weakness in: ? The neck. ? The shoulder blades. ? Upper arm,  forearm, or fingers.  You may also have muscle weakness. It may be difficult to: ? Lift your leg or arm. ? Stand on your toes. ? Squeeze tightly with one of your hands.  Other symptoms may include: ? Numbness or tingling in the affected areas of the hands, arms, feet, or legs. ? Inability to control when you urinate or when you have bowel movements. This is a rare but serious sign of a severe herniated disk in the lower back. How is this diagnosed? This condition may be diagnosed based on:  Your symptoms.  Your medical history.  A physical exam. The exam may include: ? Straight-leg test. You will lie on your back while your health care provider lifts your leg, keeping your knee straight. If you feel pain, you likely have a herniated disk. ? Neurological tests. This includes checking for numbness, reflexes, muscle strength, and posture.  Imaging tests, such as: ? X-rays. ? MRI. ? CT scan. ? Electromyogram (EMG) to check the nerves that control muscles. This test may be used to determine which nerves are affected by your herniated disk. How is this treated? Treatment for this condition may include:  A short period of rest. This is usually the first treatment. ? You may be on bed rest for up to 2 days, or you may be instructed to stay home and avoid physical activity. ? If you have a herniated disk in your lower back, avoid sitting as much as possible. Sitting increases pressure on the disk.  Medicines. These may include: ? NSAIDs to help reduce pain and swelling. ? Muscle relaxants to prevent sudden tightening of the back muscles (back spasms). ? Prescription pain medicines, if you have severe pain.  Steroid injections in the area of the herniated disk. This can help reduce pain and swelling.  Physical therapy to strengthen your back muscles. In many cases, symptoms go away with treatment over a period of days or weeks. You will most likely be free of symptoms after 3-4 months.  If other treatments do not help to relieve your symptoms, you may need surgery. Follow these instructions at home: Medicines  Take over-the-counter and prescription medicines only as told by your health care provider.  Do not drive or use heavy machinery while taking prescription pain medicine. Activity  Rest as  directed.  After your rest period: ? Return to your normal activities and gradually begin exercising as told by your health care provider. Ask your health care provider what activities and exercises are safe for you. ? Use good posture. ? Avoid movements that cause pain. ? Do not lift anything that is heavier than 10 lb (4.5 kg) until your health care provider says this is safe. ? Do not sit or stand for long periods of time without changing positions. ? Do not sit for long periods of time without getting up and moving around.  If physical therapy was prescribed, do exercises as instructed.  Aim to strengthen muscles in your back and abdomen with exercises like crunches, swimming, or walking. General instructions  Do not use any products that contain nicotine or tobacco, such as cigarettes and e-cigarettes. These products can delay healing. If you need help quitting, ask your health care provider.  Do not wear high-heeled shoes.  Do not sleep on your belly.  If you are overweight, work with your health care provider to lose weight safely.  To prevent or treat constipation while you are taking prescription pain medicine, your health care provider may recommend that you: ? Drink enough fluid to keep your urine clear or pale yellow. ? Take over-the-counter or prescription medicines. ? Eat foods that are high in fiber, such as fresh fruits and vegetables, whole grains, and beans. ? Limit foods that are high in fat and processed sugars, such as fried and sweet foods.  Keep all follow-up visits as told by your health care provider. This is important. How is this prevented?    Maintain a healthy weight.  Try to avoid stressful situations.  Maintain physical fitness. Do at least 150 minutes of moderate-intensity exercise each week, such as brisk walking or water aerobics.  When lifting objects: ? Keep your feet at least shoulder-width apart and tighten your abdominal muscles. ? Keep your spine neutral as you bend your knees and hips. It is important to lift using the strength of your legs, not your back. Do not lock your knees straight out. ? Always ask for help to lift heavy or awkward objects. Contact a health care provider if:  You have back pain or neck pain that does not get better after 6 weeks.  You have severe pain in your back, neck, legs, or arms.  You develop numbness, tingling, or weakness in any part of your body. Get help right away if:  You cannot move your arms or legs.  You cannot control when you urinate or have bowel movements.  You feel dizzy or you faint.  You have shortness of breath. This information is not intended to replace advice given to you by your health care provider. Make sure you discuss any questions you have with your health care provider. Document Released: 07/07/2000 Document Revised: 06/22/2017 Document Reviewed: 03/06/2016 Elsevier Patient Education  2020 [employer].

## 2019-07-08 ENCOUNTER — Encounter: Payer: Self-pay | Admitting: Internal Medicine

## 2019-09-19 ENCOUNTER — Other Ambulatory Visit: Payer: Self-pay

## 2019-09-24 ENCOUNTER — Ambulatory Visit: Payer: Self-pay | Admitting: Internal Medicine

## 2019-09-30 ENCOUNTER — Ambulatory Visit: Payer: Self-pay | Admitting: Internal Medicine

## 2019-10-20 ENCOUNTER — Encounter: Payer: Self-pay | Admitting: Emergency Medicine

## 2019-10-20 ENCOUNTER — Ambulatory Visit
Admission: EM | Admit: 2019-10-20 | Discharge: 2019-10-20 | Disposition: A | Discharge status: Home / Self Care | Payer: BLUE CROSS/BLUE SHIELD | Attending: Family Medicine | Admitting: Family Medicine

## 2019-10-20 ENCOUNTER — Other Ambulatory Visit: Payer: Self-pay

## 2019-10-20 DIAGNOSIS — H8110 Benign paroxysmal vertigo, unspecified ear: Secondary | ICD-10-CM | POA: Diagnosis not present

## 2019-10-20 MED ORDER — MECLIZINE HCL 25 MG PO TABS: 25.0000 mg | ORAL_TABLET | Freq: Three times a day (TID) | ORAL | 0 refills | Status: DC | PRN

## 2019-10-20 NOTE — ED Triage Notes (Signed)
Pt c/o dizziness. Started about a month ago. She states she feels like the room is moving, more when she changes positions. She also has known back problems and has had more pain and numbness in her legs and weakness in her hands since holding her grandchild.

## 2019-10-20 NOTE — ED Provider Notes (Signed)
[facility]    CSN: [id number] Arrival date & time: 10/20/19  1256      History   Chief Complaint Chief Complaint  Patient presents with  . Dizziness    HPI Teresa Deleon is a 54 y.o. female.   54 yo female with a c/o intermittent dizziness spells for the past month. States symptom of "room spinning" with head movement. States she's had benign positional vertigo and symptoms are similar. She's been trying the Epley maneuver without any relief. Denies any vision changes, headache, fevers, ear pain, chest pains, palpations.    Dizziness   Past Medical History:  Diagnosis Date  . Asthma   . Back pain   . Chronic sinusitis   . GERD (gastroesophageal reflux disease)   . Heart murmur   . Leaky heart valve   . Migraine   . Migraine   . Trichomonas infection     Patient Active Problem List   Diagnosis Date Noted  . Lumbar herniated disc 06/24/2019  . Insomnia 06/24/2019  . Female pelvic pain 06/24/2019  . Diarrhea 03/05/2019  . Nausea and vomiting 03/05/2019  . COVID-19 virus detected 02/17/2019  . 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 Chronic sinusitis 04/04/2018  . Allergic rhinitis 04/04/2018  . Tobacco abuse 04/04/2018  . Migraine without status migrainosus, not intractable 04/04/2018  . Cardiac murmur 04/04/2018  . Gastroesophageal reflux disease 04/04/2018  . Prediabetes 04/04/2018  . Fibroids 04/04/2018    Past Surgical History:  Procedure Laterality Date  . BREAST SURGERY     left breast abcess    OB History   No obstetric history on file.      Home Medications    Prior to Admission medications   Medication Sig Start Date End Date Taking? Authorizing Provider  acetaminophen (TYLENOL) 500 MG tablet Take 1 tablet (500 mg total) by mouth every 4 (four) hours as needed. 03/05/19  Yes McLean-Scocuzza, Nino Glow, MD  albuterol (VENTOLIN HFA) 108 (90 Base) MCG/ACT inhaler Inhale 1-2 puffs into the lungs every  4 (four) hours as needed for wheezing or shortness of breath. 03/05/19  Yes McLean-Scocuzza, Nino Glow, MD  fluticasone (FLONASE) 50 MCG/ACT nasal spray Place 2 sprays into both nostrils daily. Prn max 2 sprays 04/04/18  Yes McLean-Scocuzza, Nino Glow, MD  gabapentin (NEURONTIN) 300 MG capsule TAKE 1 CAPSULE BY MOUTH AT BEDTIME FOR 4 DAYS THEN 1 CAPSULE TWICE DAILY AS TOLERATED. 06/08/18  Yes [provider]  ketorolac (TORADOL) 10 MG tablet Take 1 tablet (10 mg total) by mouth every 8 (eight) hours as needed for severe pain. 11/03/18  Yes Nance Pear, MD  loratadine (CLARITIN) 10 MG tablet Take 1 tablet (10 mg total) by mouth daily as needed for allergies. 04/04/18  Yes McLean-Scocuzza, Nino Glow, MD  methocarbamol (ROBAXIN) 500 MG tablet Take 1 tablet (500 mg total) by mouth 2 (two) times daily as needed for muscle spasms. 06/24/19  Yes McLean-Scocuzza, Nino Glow, MD  montelukast (SINGULAIR) 10 MG tablet Take 1 tablet (10 mg total) by mouth at bedtime. 04/04/18  Yes McLean-Scocuzza, Nino Glow, MD  ondansetron (ZOFRAN) 4 MG tablet Take 1 tablet (4 mg total) by mouth every 8 (eight) hours as needed for nausea or vomiting. 03/05/19  Yes McLean-Scocuzza, Nino Glow, MD  meclizine (ANTIVERT) 25 MG tablet Take 1 tablet (25 mg total) by mouth 3 (three) times daily as needed for dizziness. 10/20/19   Norval Gable, MD  oxyCODONE-acetaminophen (PERCOCET) 5-325 MG  tablet Take 1 tablet by mouth 2 (two) times daily as needed for severe pain. 06/24/19   McLean-Scocuzza, Nino Glow, MD  traZODone (DESYREL) 50 MG tablet Take 0.5-1 tablets (25-50 mg total) by mouth at bedtime as needed for sleep. 06/24/19   McLean-Scocuzza, Nino Glow, MD    Family History Family History  Problem Relation Age of Onset  . Asthma Mother   . COPD Mother   . Diabetes Mother   . Miscarriages / [place] Mother   . Diabetes Father   . Heart disease Father   . Hypertension Father   . Asthma Daughter   . Miscarriages / [place] Daughter     . Arthritis Maternal Grandmother   . Asthma Maternal Grandmother   . Hyperlipidemia Maternal Grandmother   . Diabetes Maternal Grandfather   . Stroke Paternal Grandmother   . Heart disease Brother     Social History Social History   Tobacco Use  . Smoking status: Current Some Day Smoker    Packs/day: 0.50    Types: Cigarettes  . Smokeless tobacco: Never Used  . Tobacco comment: 1 ppd   Substance Use Topics  . Alcohol use: Yes    Comment: occassionally  . Drug use: No     Allergies   Shellfish allergy   Review of Systems Review of Systems  Neurological: Positive for dizziness.     Physical Exam Triage Vital Signs ED Triage Vitals  Enc Vitals Group     BP 10/20/19 1330 127/82     Pulse Rate 10/20/19 1330 74     Resp 10/20/19 1330 18     Temp 10/20/19 1330 98.2 F (36.8 C)     Temp Source 10/20/19 1330 Oral     SpO2 10/20/19 1330 98 %     Weight 10/20/19 1324 142 lb (64.4 kg)     Height 10/20/19 1324 5' (1.524 m)     Head Circumference --      Peak Flow --      Pain Score 10/20/19 1323 10     Pain Loc --      Pain Edu? --      Excl. in [place]? --    No data found.  Updated Vital Signs BP 127/82 (BP Location: Right Arm)   Pulse 74   Temp 98.2 F (36.8 C) (Oral)   Resp 18   Ht 5' (1.524 m)   Wt 64.4 kg   SpO2 98%   BMI 27.73 kg/m   Visual Acuity Right Eye Distance:   Left Eye Distance:   Bilateral Distance:    Right Eye Near:   Left Eye Near:    Bilateral Near:     Physical Exam Vitals and nursing note reviewed.  Constitutional:      General: She is not in acute distress.    Appearance: She is not toxic-appearing or diaphoretic.  HENT:     Right Ear: Tympanic membrane normal.     Left Ear: Tympanic membrane normal.  Eyes:     Extraocular Movements: Extraocular movements intact.     Pupils: Pupils are equal, round, and reactive to light.  Cardiovascular:     Rate and Rhythm: Normal rate.     Heart sounds: Normal heart sounds.   Pulmonary:     Effort: Pulmonary effort is normal. No respiratory distress.  Neurological:     General: No focal deficit present.     Mental Status: She is alert and oriented to person, place, and time.  UC Treatments / Results  Labs (all labs ordered are listed, but only abnormal results are displayed) Labs Reviewed - No data to display  EKG   Radiology No results found.  Procedures Procedures (including critical care time)  Medications Ordered in UC Medications - No data to display  Initial Impression / Assessment and Plan / UC Course  I have reviewed the triage vital signs and the nursing notes.  Pertinent labs & imaging results that were available during my care of the patient were reviewed by me and considered in my medical decision making (see chart for details).      Final Clinical Impressions(s) / UC Diagnoses   Final diagnoses:  Benign paroxysmal positional vertigo, unspecified laterality    ED Prescriptions    Medication Sig Dispense Auth. Provider   meclizine (ANTIVERT) 25 MG tablet Take 1 tablet (25 mg total) by mouth 3 (three) times daily as needed for dizziness. 30 tablet Norval Gable, MD      1. diagnosis reviewed with patient 2. rx as per orders above; reviewed possible side effects, interactions, risks and benefits  3. Recommend continue Epley maneuver  4. Follow up with ENT if no improvement or worsening 5. Follow-up prn   PDMP not reviewed this encounter.   Norval Gable, MD 10/20/19 [phone]

## 2019-11-26 ENCOUNTER — Ambulatory Visit: Payer: BLUE CROSS/BLUE SHIELD | Admitting: Internal Medicine

## 2019-11-26 ENCOUNTER — Telehealth: Payer: Self-pay | Admitting: Internal Medicine

## 2019-11-26 NOTE — Telephone Encounter (Signed)
Patient no-showed today's appointment; appointment was for 11/26/19 at 11 am, provider notified for review of record, Letter mailed to address on file for patient to call and reschedule.

## 2019-12-09 ENCOUNTER — Ambulatory Visit: Payer: BLUE CROSS/BLUE SHIELD | Admitting: Internal Medicine

## 2019-12-11 ENCOUNTER — Telehealth: Payer: Self-pay | Admitting: Internal Medicine

## 2019-12-31 ENCOUNTER — Telehealth: Payer: Self-pay | Admitting: Internal Medicine

## 2020-01-01 ENCOUNTER — Other Ambulatory Visit: Payer: Self-pay

## 2020-01-01 ENCOUNTER — Telehealth: Payer: Self-pay | Admitting: Internal Medicine

## 2020-01-01 ENCOUNTER — Telehealth (INDEPENDENT_AMBULATORY_CARE_PROVIDER_SITE_OTHER): Payer: 59 | Admitting: Internal Medicine

## 2020-01-01 VITALS — Ht 60.0 in | Wt 146.0 lb

## 2020-01-01 DIAGNOSIS — G4700 Insomnia, unspecified: Secondary | ICD-10-CM | POA: Diagnosis not present

## 2020-01-01 DIAGNOSIS — J321 Chronic frontal sinusitis: Secondary | ICD-10-CM

## 2020-01-01 DIAGNOSIS — Z720 Tobacco use: Secondary | ICD-10-CM | POA: Diagnosis not present

## 2020-01-01 DIAGNOSIS — F17210 Nicotine dependence, cigarettes, uncomplicated: Secondary | ICD-10-CM | POA: Diagnosis not present

## 2020-01-01 DIAGNOSIS — R05 Cough: Secondary | ICD-10-CM

## 2020-01-01 DIAGNOSIS — G2581 Restless legs syndrome: Secondary | ICD-10-CM

## 2020-01-01 DIAGNOSIS — R059 Cough, unspecified: Secondary | ICD-10-CM

## 2020-01-01 DIAGNOSIS — J4521 Mild intermittent asthma with (acute) exacerbation: Secondary | ICD-10-CM

## 2020-01-01 DIAGNOSIS — J309 Allergic rhinitis, unspecified: Secondary | ICD-10-CM

## 2020-01-01 DIAGNOSIS — M5126 Other intervertebral disc displacement, lumbar region: Secondary | ICD-10-CM

## 2020-01-01 MED ORDER — BUDESONIDE-FORMOTEROL FUMARATE 80-4.5 MCG/ACT IN AERO: 2.0000 | INHALATION_SPRAY | Freq: Two times a day (BID) | RESPIRATORY_TRACT | 12 refills | Status: DC

## 2020-01-01 MED ORDER — NICOTINE 14 MG/24HR TD PT24: 14.0000 mg | MEDICATED_PATCH | Freq: Every day | TRANSDERMAL | 0 refills | Status: DC

## 2020-01-01 MED ORDER — VARENICLINE TARTRATE 1 MG PO TABS: 1.0000 mg | ORAL_TABLET | Freq: Two times a day (BID) | ORAL | 1 refills | Status: DC

## 2020-01-01 MED ORDER — MUCINEX DM MAXIMUM STRENGTH 60-1200 MG PO TB12: 1.0000 | ORAL_TABLET | Freq: Two times a day (BID) | ORAL | 0 refills | Status: DC | PRN

## 2020-01-01 MED ORDER — LORATADINE 10 MG PO TABS: 10.0000 mg | ORAL_TABLET | Freq: Every day | ORAL | 3 refills | Status: DC | PRN

## 2020-01-01 MED ORDER — AZITHROMYCIN 250 MG PO TABS: ORAL_TABLET | ORAL | 0 refills | Status: DC

## 2020-01-01 MED ORDER — ALBUTEROL SULFATE HFA 108 (90 BASE) MCG/ACT IN AERS: 1.0000 | INHALATION_SPRAY | RESPIRATORY_TRACT | 11 refills | Status: DC | PRN

## 2020-01-01 MED ORDER — METHOCARBAMOL 500 MG PO TABS: 500.0000 mg | ORAL_TABLET | Freq: Two times a day (BID) | ORAL | 2 refills | Status: DC | PRN

## 2020-01-01 MED ORDER — MONTELUKAST SODIUM 10 MG PO TABS: 10.0000 mg | ORAL_TABLET | Freq: Every day | ORAL | 3 refills | Status: DC

## 2020-01-01 MED ORDER — NICOTINE 7 MG/24HR TD PT24: 7.0000 mg | MEDICATED_PATCH | Freq: Every day | TRANSDERMAL | 0 refills | Status: DC

## 2020-01-01 MED ORDER — CHANTIX STARTING MONTH PAK 0.5 MG X 11 & 1 MG X 42 PO TABS: ORAL_TABLET | ORAL | 0 refills | Status: DC

## 2020-01-01 MED ORDER — PREDNISONE 20 MG PO TABS: 40.0000 mg | ORAL_TABLET | Freq: Every day | ORAL | 0 refills | Status: DC

## 2020-01-01 MED ORDER — TRAZODONE HCL 50 MG PO TABS: 25.0000 mg | ORAL_TABLET | Freq: Every evening | ORAL | 5 refills | Status: DC | PRN

## 2020-01-01 MED ORDER — VARENICLINE TARTRATE 0.5 MG PO TABS: ORAL_TABLET | ORAL | 0 refills | Status: DC

## 2020-01-01 NOTE — Progress Notes (Signed)
telephone Note  I connected with Teresa Deleon  on 01/01/20 at 10:15 AM EDT by telephone and verified that I am speaking with the correct person using two identifiers.  Location patient: [place] Location provider:work or home office Persons participating in the virtual visit: patient, provider, pts boyfriend Tim  I discussed the limitations of evaluation and management by telemedicine and the availability of in person appointments. The patient expressed understanding and agreed to proceed.   HPI: 1. C/o rls at night and keeps moving her legs nothing tried new 2. C/o sinusitis/asthma with flare/tobacco abuse smoking <1/2 ppd c/o cough, wheezing and sinus pressure right side and pain 10/10 and pressure right eye and neck and change is weather was trigger nothing tried out of albuterol inhaler.  Wants smoking cessation  3. C/on insomnia tried melatonin w/o relief agreeable to try trazaone    ROS: See pertinent positives and negatives per HPI.  Past Medical History:  Diagnosis Date  . Asthma   . Back pain   . Chronic sinusitis   . GERD (gastroesophageal reflux disease)   . Heart murmur   . Leaky heart valve   . Migraine   . Migraine   . Trichomonas infection     Past Surgical History:  Procedure Laterality Date  . BREAST SURGERY     left breast abcess    Family History  Problem Relation Age of Onset  . Asthma Mother   . COPD Mother   . Diabetes Mother   . Miscarriages / [place] Mother   . Diabetes Father   . Heart disease Father   . Hypertension Father   . Asthma Daughter   . Miscarriages / [place] Daughter   . Arthritis Maternal Grandmother   . Asthma Maternal Grandmother   . Hyperlipidemia Maternal Grandmother   . Diabetes Maternal Grandfather   . Stroke Paternal Grandmother   . Heart disease Brother     SOCIAL HX: lives with SO   Current Outpatient Medications:  .  acetaminophen (TYLENOL) 500 MG tablet, Take 1 tablet (500 mg total) by mouth  every 4 (four) hours as needed., Disp: 90 tablet, Rfl: 3 .  fluticasone (FLONASE) 50 MCG/ACT nasal spray, Place 2 sprays into both nostrils daily. Prn max 2 sprays, Disp: 16 g, Rfl: 6 .  gabapentin (NEURONTIN) 300 MG capsule, TAKE 1 CAPSULE BY MOUTH AT BEDTIME FOR 4 DAYS THEN 1 CAPSULE TWICE DAILY AS TOLERATED., Disp: , Rfl: 1 .  loratadine (CLARITIN) 10 MG tablet, Take 1 tablet (10 mg total) by mouth daily as needed for allergies., Disp: 90 tablet, Rfl: 3 .  meclizine (ANTIVERT) 25 MG tablet, Take 1 tablet (25 mg total) by mouth 3 (three) times daily as needed for dizziness., Disp: 30 tablet, Rfl: 0 .  methocarbamol (ROBAXIN) 500 MG tablet, Take 1 tablet (500 mg total) by mouth 2 (two) times daily as needed for muscle spasms., Disp: 60 tablet, Rfl: 2 .  montelukast (SINGULAIR) 10 MG tablet, Take 1 tablet (10 mg total) by mouth at bedtime., Disp: 90 tablet, Rfl: 3 .  ondansetron (ZOFRAN) 4 MG tablet, Take 1 tablet (4 mg total) by mouth every 8 (eight) hours as needed for nausea or vomiting., Disp: 40 tablet, Rfl: 0 .  oxyCODONE-acetaminophen (PERCOCET) 5-325 MG tablet, Take 1 tablet by mouth 2 (two) times daily as needed for severe pain., Disp: 10 tablet, Rfl: 0 .  Phenyleph-CPM-DM-Aspirin (ALKA-SELTZER PLUS COLD & COUGH PO), Take by mouth., Disp: , Rfl:  .  traZODone (DESYREL)  50 MG tablet, Take 0.5-1 tablets (25-50 mg total) by mouth at bedtime as needed for sleep., Disp: 30 tablet, Rfl: 5 .  albuterol (VENTOLIN HFA) 108 (90 Base) MCG/ACT inhaler, Inhale 1-2 puffs into the lungs every 4 (four) hours as needed for wheezing or shortness of breath., Disp: 18 g, Rfl: 11 .  azithromycin (ZITHROMAX) 250 MG tablet, 2 pills day 1 and 1 pill day 2-5, Disp: 6 tablet, Rfl: 0 .  budesonide-formoterol (SYMBICORT) 80-4.5 MCG/ACT inhaler, Inhale 2 puffs into the lungs 2 (two) times daily. Rinse  Mouth, Disp: 1 Inhaler, Rfl: 12 .  Dextromethorphan-guaiFENesin (MUCINEX DM MAXIMUM STRENGTH) 60-1200 MG TB12, Take 1  tablet by mouth 2 (two) times daily as needed. cough, Disp: 30 tablet, Rfl: 0 .  ketorolac (TORADOL) 10 MG tablet, Take 1 tablet (10 mg total) by mouth every 8 (eight) hours as needed for severe pain. (Patient not taking: Reported on 01/01/2020), Disp: 20 tablet, Rfl: 0 .  nicotine (NICODERM CQ - DOSED IN MG/24 HOURS) 14 mg/24hr patch, Place 1 patch (14 mg total) onto the skin daily., Disp: 60 patch, Rfl: 0 .  nicotine (NICODERM CQ - DOSED IN MG/24 HR) 7 mg/24hr patch, Place 1 patch (7 mg total) onto the skin daily., Disp: 60 patch, Rfl: 0 .  predniSONE (DELTASONE) 20 MG tablet, Take 2 tablets (40 mg total) by mouth daily with breakfast. X 7 days, Disp: 14 tablet, Rfl: 0 .  varenicline (CHANTIX) 0.5 MG tablet, 0.5 x 3 days, 0.5 mg bid x 7 days day 8 1 bid x 3 months, Disp: 11 tablet, Rfl: 0  EXAM:  VITALS per patient if applicable:  GENERAL: alert, oriented, appears well and in no acute distress  LUNGS: audible breathing rate appears normal, no obvious gross SOB, gasping or wheezing  \PSYCH/NEURO: pleasant and cooperative, no obvious depression or anxiety, speech and thought processing grossly intact  ASSESSMENT AND PLAN:  Discussed the following assessment and plan:  Frontal sinusitis, unspecified chronicity - Plan: azithromycin (ZITHROMAX) 250 MG tablet, predniSONE (DELTASONE) 40 MG tablet  Insomnia, unspecified type - Plan: traZODone (DESYREL) 50 MG tablet  Tobacco abuse - Plan: varenicline (CHANTIX) 0.5 MG tablet, nicotine (NICODERM CQ - DOSED IN MG/24 HOURS) 14 mg/24hr patch, nicotine (NICODERM CQ - DOSED IN MG/24 HR) 7 mg/24hr patch, Dextromethorphan-guaiFENesin (MUCINEX DM MAXIMUM STRENGTH) 60-1200 MG TB12  Cigarette nicotine dependence without complication - Plan: varenicline (CHANTIX) 0.5 MG tablet, nicotine (NICODERM CQ - DOSED IN MG/24 HOURS) 14 mg/24hr patch, nicotine (NICODERM CQ - DOSED IN MG/24 HR) 7 mg/24hr patch rec cessation   Allergic rhinitis, unspecified seasonality,  unspecified trigger - Plan: montelukast (SINGULAIR) 10 MG tablet, loratadine (CLARITIN) 10 MG tablet  Mild intermittent asthma with acute exacerbation - Plan: albuterol (VENTOLIN HFA) 108 (90 Base) MCG/ACT inhaler, budesonide-formoterol (SYMBICORT) 80-4.5 MCG/ACT inhaler, predniSONE (DELTASONE) 20 MG tablet, Dextromethorphan-guaiFENesin (MUCINEX DM MAXIMUM STRENGTH) 60-1200 MG TB12  Lumbar herniated disc - Plan: methocarbamol (ROBAXIN) 500 MG tablet bid prn  RLS (restless legs syndrome) Try robaxin 500 mg qhs prn   HM sch fasting labs before next appt when feeling better  Flu shot last in 2019  Tdap ? Date review prior records  Consider MMR vaccine and check hep B/C status  Consider shingirx   Per pt had HIV test [facility] requested records today   Referred mammogram prev never had reordered order about to expire   04/25/18 pap negative pap also h/o fibroids, enlarged cervix, adenaxal mass, trichomonas, mild endometrial thickening referred  to Dr. Georgianne Fick bx endometrium neg 05/02/18  Colonoscopy never had disc prev  Smoker 1ppd now <1/2 Rx nicotine gum today pt wants to try to quit  -we discussed possible serious and likely etiologies, options for evaluation and workup, limitations of telemedicine visit vs in person visit, treatment, treatment risks and precautions. Pt prefers to treat via telemedicine empirically rather then risking or undertaking an in person visit at this moment. Patient agrees to seek prompt in person care if worsening, new symptoms arise, or if is not improving with treatment.   I discussed the assessment and treatment plan with the patient. The patient was provided an opportunity to ask questions and all were answered. The patient agreed with the plan and demonstrated an understanding of the instructions.   The patient was advised to call back or seek an in-person evaluation if the symptoms worsen or if the condition fails to improve as  anticipated.  Time 30 min Delorise Jackson, MD

## 2020-01-01 NOTE — Telephone Encounter (Signed)
Pharmacy calling back in asking for Starter pack and then maintenance packs of Chantix be sent in instead. Okayed this with Dr Olivia Mackie and medications sent back in.

## 2020-01-01 NOTE — Addendum Note (Signed)
Addended by: Thressa Sheller on: 01/01/2020 03:47 PM   Modules accepted: Orders

## 2020-01-01 NOTE — Telephone Encounter (Signed)
[facility] calling for clarification on the quantity of the Chantix sent in. Patient was to take one pill a day for 3 days, one pill two times daily for 7 days and the twice a day for 3 months. Only 11 pills were sent.   Gave verbal to increase quantity to match the signature.

## 2020-01-22 ENCOUNTER — Other Ambulatory Visit: Payer: 59

## 2020-01-23 ENCOUNTER — Other Ambulatory Visit (INDEPENDENT_AMBULATORY_CARE_PROVIDER_SITE_OTHER): Payer: 59

## 2020-01-23 ENCOUNTER — Other Ambulatory Visit: Payer: Self-pay

## 2020-01-23 ENCOUNTER — Telehealth: Payer: Self-pay | Admitting: Internal Medicine

## 2020-01-23 DIAGNOSIS — R7303 Prediabetes: Secondary | ICD-10-CM | POA: Diagnosis not present

## 2020-01-23 DIAGNOSIS — Z1329 Encounter for screening for other suspected endocrine disorder: Secondary | ICD-10-CM | POA: Diagnosis not present

## 2020-01-23 DIAGNOSIS — Z13220 Encounter for screening for lipoid disorders: Secondary | ICD-10-CM

## 2020-01-23 DIAGNOSIS — E559 Vitamin D deficiency, unspecified: Secondary | ICD-10-CM

## 2020-01-23 DIAGNOSIS — Z0000 Encounter for general adult medical examination without abnormal findings: Secondary | ICD-10-CM | POA: Diagnosis not present

## 2020-01-23 DIAGNOSIS — Z1389 Encounter for screening for other disorder: Secondary | ICD-10-CM

## 2020-01-23 LAB — CBC WITH DIFFERENTIAL/PLATELET
Basophils Absolute: 0.1 10*3/uL (ref 0.0–0.1)
Basophils Relative: 1.2 % (ref 0.0–3.0)
Eosinophils Absolute: 0.2 10*3/uL (ref 0.0–0.7)
Eosinophils Relative: 2.7 % (ref 0.0–5.0)
HCT: 42.8 % (ref 36.0–46.0)
Hemoglobin: 14.4 g/dL (ref 12.0–15.0)
Lymphocytes Relative: 35.1 % (ref 12.0–46.0)
Lymphs Abs: 2.8 10*3/uL (ref 0.7–4.0)
MCHC: 33.7 g/dL (ref 30.0–36.0)
MCV: 93.8 fl (ref 78.0–100.0)
Monocytes Absolute: 0.6 10*3/uL (ref 0.1–1.0)
Monocytes Relative: 6.9 % (ref 3.0–12.0)
Neutro Abs: 4.3 10*3/uL (ref 1.4–7.7)
Neutrophils Relative %: 54.1 % (ref 43.0–77.0)
Platelets: 373 10*3/uL (ref 150.0–400.0)
RBC: 4.56 Mil/uL (ref 3.87–5.11)
RDW: 13.9 % (ref 11.5–15.5)
WBC: 8 10*3/uL (ref 4.0–10.5)

## 2020-01-23 LAB — LIPID PANEL
Cholesterol: 213 mg/dL — ABNORMAL HIGH (ref 0–200)
HDL: 40.3 mg/dL (ref >39.00)
LDL Cholesterol: 150 mg/dL — ABNORMAL HIGH (ref 0–99)
NonHDL: 172.64
Total CHOL/HDL Ratio: 5
Triglycerides: 115 mg/dL (ref 0.0–149.0)
VLDL: 23 mg/dL (ref 0.0–40.0)

## 2020-01-23 LAB — COMPREHENSIVE METABOLIC PANEL
ALT: 16 U/L (ref 0–35)
AST: 16 U/L (ref 0–37)
Albumin: 4.6 g/dL (ref 3.5–5.2)
Alkaline Phosphatase: 98 U/L (ref 39–117)
BUN: 10 mg/dL (ref 6–23)
CO2: 26 mEq/L (ref 19–32)
Calcium: 9.4 mg/dL (ref 8.4–10.5)
Chloride: 106 mEq/L (ref 96–112)
Creatinine, Ser: 0.94 mg/dL (ref 0.40–1.20)
GFR: 74.5 mL/min (ref >60.00)
Glucose, Bld: 106 mg/dL — ABNORMAL HIGH (ref 70–99)
Potassium: 4 mEq/L (ref 3.5–5.1)
Sodium: 142 mEq/L (ref 135–145)
Total Bilirubin: 0.4 mg/dL (ref 0.2–1.2)
Total Protein: 7 g/dL (ref 6.0–8.3)

## 2020-01-23 LAB — HEMOGLOBIN A1C: Hgb A1c MFr Bld: 6.4 % (ref 4.6–6.5)

## 2020-01-23 LAB — VITAMIN D 25 HYDROXY (VIT D DEFICIENCY, FRACTURES): VITD: 24.35 ng/mL — ABNORMAL LOW (ref 30.00–100.00)

## 2020-01-23 LAB — TSH: TSH: 1.69 u[IU]/mL (ref 0.35–4.50)

## 2020-01-23 NOTE — Addendum Note (Signed)
Addended by: Tor Netters I on: 01/23/2020 09:15 AM   Modules accepted: Orders

## 2020-01-26 ENCOUNTER — Encounter: Payer: Self-pay | Admitting: Internal Medicine

## 2020-01-26 DIAGNOSIS — E785 Hyperlipidemia, unspecified: Secondary | ICD-10-CM | POA: Insufficient documentation

## 2020-01-26 DIAGNOSIS — E1169 Type 2 diabetes mellitus with other specified complication: Secondary | ICD-10-CM | POA: Insufficient documentation

## 2020-01-26 DIAGNOSIS — E559 Vitamin D deficiency, unspecified: Secondary | ICD-10-CM | POA: Insufficient documentation

## 2020-01-26 NOTE — Patient Instructions (Signed)

## 2020-02-02 ENCOUNTER — Other Ambulatory Visit: Payer: Self-pay | Admitting: Internal Medicine

## 2020-02-02 DIAGNOSIS — E785 Hyperlipidemia, unspecified: Secondary | ICD-10-CM

## 2020-02-02 MED ORDER — ATORVASTATIN CALCIUM 10 MG PO TABS: 10.0000 mg | ORAL_TABLET | Freq: Every day | ORAL | 3 refills | Status: DC

## 2020-04-09 ENCOUNTER — Ambulatory Visit (INDEPENDENT_AMBULATORY_CARE_PROVIDER_SITE_OTHER): Payer: 59 | Admitting: Internal Medicine

## 2020-04-09 ENCOUNTER — Other Ambulatory Visit: Payer: Self-pay

## 2020-04-09 ENCOUNTER — Ambulatory Visit (INDEPENDENT_AMBULATORY_CARE_PROVIDER_SITE_OTHER): Payer: 59

## 2020-04-09 ENCOUNTER — Encounter: Payer: Self-pay | Admitting: Internal Medicine

## 2020-04-09 VITALS — BP 130/80 | HR 91 | Temp 97.9°F | Ht 60.0 in | Wt 147.8 lb

## 2020-04-09 DIAGNOSIS — M5416 Radiculopathy, lumbar region: Secondary | ICD-10-CM | POA: Diagnosis not present

## 2020-04-09 DIAGNOSIS — G4700 Insomnia, unspecified: Secondary | ICD-10-CM

## 2020-04-09 DIAGNOSIS — R29898 Other symptoms and signs involving the musculoskeletal system: Secondary | ICD-10-CM | POA: Diagnosis not present

## 2020-04-09 DIAGNOSIS — H53149 Visual discomfort, unspecified: Secondary | ICD-10-CM

## 2020-04-09 DIAGNOSIS — R079 Chest pain, unspecified: Secondary | ICD-10-CM

## 2020-04-09 DIAGNOSIS — M542 Cervicalgia: Secondary | ICD-10-CM

## 2020-04-09 DIAGNOSIS — M549 Dorsalgia, unspecified: Secondary | ICD-10-CM | POA: Diagnosis not present

## 2020-04-09 DIAGNOSIS — M5126 Other intervertebral disc displacement, lumbar region: Secondary | ICD-10-CM

## 2020-04-09 DIAGNOSIS — K219 Gastro-esophageal reflux disease without esophagitis: Secondary | ICD-10-CM

## 2020-04-09 DIAGNOSIS — G2581 Restless legs syndrome: Secondary | ICD-10-CM

## 2020-04-09 DIAGNOSIS — R937 Abnormal findings on diagnostic imaging of other parts of musculoskeletal system: Secondary | ICD-10-CM

## 2020-04-09 DIAGNOSIS — G5603 Carpal tunnel syndrome, bilateral upper limbs: Secondary | ICD-10-CM

## 2020-04-09 DIAGNOSIS — R1310 Dysphagia, unspecified: Secondary | ICD-10-CM

## 2020-04-09 DIAGNOSIS — R011 Cardiac murmur, unspecified: Secondary | ICD-10-CM

## 2020-04-09 LAB — TROPONIN I: Troponin I: 4 ng/L (ref <=47)

## 2020-04-09 LAB — D-DIMER, QUANTITATIVE: D-Dimer, Quant: 0.23 mcg/mL FEU (ref <0.50)

## 2020-04-09 LAB — BRAIN NATRIURETIC PEPTIDE: Brain Natriuretic Peptide: 4 pg/mL (ref <100)

## 2020-04-09 MED ORDER — PANTOPRAZOLE SODIUM 40 MG PO TBEC: 40.0000 mg | DELAYED_RELEASE_TABLET | Freq: Every day | ORAL | 3 refills | Status: DC

## 2020-04-09 MED ORDER — METHOCARBAMOL 500 MG PO TABS: 500.0000 mg | ORAL_TABLET | Freq: Two times a day (BID) | ORAL | 5 refills | Status: DC | PRN

## 2020-04-09 MED ORDER — GABAPENTIN 300 MG PO CAPS: 600.0000 mg | ORAL_CAPSULE | Freq: Every day | ORAL | 3 refills | Status: DC

## 2020-04-09 NOTE — Progress Notes (Addendum)
Chief Complaint  Patient presents with  . Chest Pain   F/u  1. C/o chest pain heaviness mid to left chest pain 1.5 weeks pain worse with lying down and uses pillows under knees. Pain is not in area of scar on the chest associated with nausea w/o vomiting, pain 10/10 nothing tried but she also reports having trouble with swallowing at times esp. Liquids.  Denies anxiety as cause 2. Dysphagia c/o liquid at times causing her to cough and foods getting caught in her throat to the point at times has chest pain and radiates to mid back 3. 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 C/o eyes sensitive to light rec eye exam [facility] call and schedule  5. C/o weak left hand grip and numbness with trouble opening bottles/cans c/w CTS will refer to ortho She declines shots for now with Dr. Sharlet Salina saw 05/17/18  Also c/o mid to lower back pain and neck pain at times 10/10 nothing tried declines injections steroids for now will defer to ortho 04/18/18 L spine Mri  IMPRESSION: 1. 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 2. Minimal disc bulging L2-L3 through L4-L5. Capacious underlying spinal canal. Perhaps a right foraminal disc bulge could be a source for right L4 radiculitis, but there is no other neural impingement.  6.insomnia sleeping 10 pm to 12 am and then hard to fall back asleep she does have stressors but denies anxiety/depression. Stressor is boyfriends father who they live with want them out of the house   Review of Systems  Constitutional: Negative for weight loss.  HENT: Negative for hearing loss.   Eyes: Positive for photophobia.  Respiratory: Negative for shortness of breath.   Cardiovascular: Positive for chest pain.  Gastrointestinal: Negative for constipation.  Musculoskeletal: Positive for back pain, joint pain and neck pain.  Skin: Negative for rash.  Psychiatric/Behavioral: Negative for depression. The patient has insomnia. The  patient is not nervous/anxious.    Past Medical History:  Diagnosis Date  . Asthma   . Back pain   . Chronic sinusitis   . GERD (gastroesophageal reflux disease)   . Heart murmur   . Leaky heart valve   . Migraine   . Migraine   . Trichomonas infection    Past Surgical History:  Procedure Laterality Date  . BREAST SURGERY     left breast abcess   Family History  Problem Relation Age of Onset  . Asthma Mother   . COPD Mother   . Diabetes Mother   . Miscarriages / [place] Mother   . Diabetes Father   . Heart disease Father   . Hypertension Father   . Heart failure Father   . Heart attack Father   . Asthma Daughter   . Miscarriages / [place] Daughter   . Arthritis Maternal Grandmother   . Asthma Maternal Grandmother   . Hyperlipidemia Maternal Grandmother   . Diabetes Maternal Grandfather   . Stroke Paternal Grandmother   . Heart disease Brother    Social History   Socioeconomic History  . Marital status: Single    Spouse name: Not on file  . Number of children: Not on file  . Years of education: Not on file  . Highest education level: Not on file  Occupational History  . Not on file  Tobacco Use  . Smoking status: Current Some Day Smoker    Packs/day: 0.50    Types: Cigarettes  . Smokeless tobacco: Never  Used  . Tobacco comment: 1 ppd   Vaping Use  . Vaping Use: Never used  Substance and Sexual Activity  . Alcohol use: Yes    Comment: occassionally  . Drug use: No  . Sexual activity: Yes    Comment: men  Other Topics Concern  . Not on file  Social History Narrative   GED   Works [employer]    2 kids    No guns, wears selt belt, safe in relationship    Social Determinants of Health   Financial Resource Strain:   . Difficulty of Paying Living Expenses: Not on file  Food Insecurity:   . Worried About Charity fundraiser in the Last Year: Not on file  . Ran Out of Food in the Last Year: Not on file  Transportation Needs:    . Lack of Transportation (Medical): Not on file  . Lack of Transportation (Non-Medical): Not on file  Physical Activity:   . Days of Exercise per Week: Not on file  . Minutes of Exercise per Session: Not on file  Stress:   . Feeling of Stress : Not on file  Social Connections:   . Frequency of Communication with Friends and Family: Not on file  . Frequency of Social Gatherings with Friends and Family: Not on file  . Attends Religious Services: Not on file  . Active Member of Clubs or Organizations: Not on file  . Attends Archivist Meetings: Not on file  . Marital Status: Not on file  Intimate Partner Violence:   . Fear of Current or Ex-Partner: Not on file  . Emotionally Abused: Not on file  . Physically Abused: Not on file  . Sexually Abused: Not on file   Current Meds  Medication Sig  . acetaminophen (TYLENOL) 500 MG tablet Take 1 tablet (500 mg total) by mouth every 4 (four) hours as needed.  Marland Kitchen albuterol (VENTOLIN HFA) 108 (90 Base) MCG/ACT inhaler Inhale 1-2 puffs into the lungs every 4 (four) hours as needed for wheezing or shortness of breath.  Marland Kitchen atorvastatin (LIPITOR) 10 MG tablet Take 1 tablet (10 mg total) by mouth daily. At night  . budesonide-formoterol (SYMBICORT) 80-4.5 MCG/ACT inhaler Inhale 2 puffs into the lungs 2 (two) times daily. Rinse  Mouth  . fluticasone (FLONASE) 50 MCG/ACT nasal spray Place 2 sprays into both nostrils daily. Prn max 2 sprays  . gabapentin (NEURONTIN) 300 MG capsule Take 2 capsules (600 mg total) by mouth at bedtime.  Marland Kitchen ketorolac (TORADOL) 10 MG tablet Take 1 tablet (10 mg total) by mouth every 8 (eight) hours as needed for severe pain.  Marland Kitchen loratadine (CLARITIN) 10 MG tablet Take 1 tablet (10 mg total) by mouth daily as needed for allergies.  Marland Kitchen meclizine (ANTIVERT) 25 MG tablet Take 1 tablet (25 mg total) by mouth 3 (three) times daily as needed for dizziness.  . methocarbamol (ROBAXIN) 500 MG tablet Take 1 tablet (500 mg total) by  mouth 2 (two) times daily as needed for muscle spasms.  . montelukast (SINGULAIR) 10 MG tablet Take 1 tablet (10 mg total) by mouth at bedtime.  . nicotine (NICODERM CQ - DOSED IN MG/24 HOURS) 14 mg/24hr patch Place 1 patch (14 mg total) onto the skin daily.  . nicotine (NICODERM CQ - DOSED IN MG/24 HR) 7 mg/24hr patch Place 1 patch (7 mg total) onto the skin daily.  . ondansetron (ZOFRAN) 4 MG tablet Take 1 tablet (4 mg  total) by mouth every 8 (eight) hours as needed for nausea or vomiting.  Marland Kitchen Phenyleph-CPM-DM-Aspirin (ALKA-SELTZER PLUS COLD & COUGH PO) Take by mouth.  . traZODone (DESYREL) 50 MG tablet Take 0.5-1 tablets (25-50 mg total) by mouth at bedtime as needed for sleep.  . varenicline (CHANTIX CONTINUING MONTH PAK) 1 MG tablet Take 1 tablet (1 mg total) by mouth 2 (two) times daily.  . varenicline (CHANTIX STARTING MONTH PAK) 0.5 MG X 11 & 1 MG X 42 tablet Take one 0.5 mg tablet by mouth once daily for 3 days, then increase to one 0.5 mg tablet twice daily for 4 days, then increase to one 1 mg tablet twice daily.  . [DISCONTINUED] azithromycin (ZITHROMAX) 250 MG tablet 2 pills day 1 and 1 pill day 2-5  . [DISCONTINUED] Dextromethorphan-guaiFENesin (MUCINEX DM MAXIMUM STRENGTH) 60-1200 MG TB12 Take 1 tablet by mouth 2 (two) times daily as needed. cough  . [DISCONTINUED] gabapentin (NEURONTIN) 300 MG capsule TAKE 1 CAPSULE BY MOUTH AT BEDTIME FOR 4 DAYS THEN 1 CAPSULE TWICE DAILY AS TOLERATED.  . [DISCONTINUED] methocarbamol (ROBAXIN) 500 MG tablet Take 1 tablet (500 mg total) by mouth 2 (two) times daily as needed for muscle spasms.  . [DISCONTINUED] oxyCODONE-acetaminophen (PERCOCET) 5-325 MG tablet Take 1 tablet by mouth 2 (two) times daily as needed for severe pain.  . [DISCONTINUED] predniSONE (DELTASONE) 20 MG tablet Take 2 tablets (40 mg total) by mouth daily with breakfast. X 7 days   Allergies  Allergen Reactions  . Shellfish Allergy Itching   Recent Results (from the past 2160  hour(s))  Vitamin D (25 hydroxy)     Status: Abnormal   Collection Time: 01/23/20  9:02 AM  Result Value Ref Range   VITD 24.35 (L) 30.00 - 100.00 ng/mL  HgB A1c     Status: None   Collection Time: 01/23/20  9:02 AM  Result Value Ref Range   Hgb A1c MFr Bld 6.4 4.6 - 6.5 %    Comment: Glycemic Control Guidelines for People with Diabetes:Non Diabetic:  <6%Goal of Therapy: <7%Additional Action Suggested:  >8%   TSH     Status: None   Collection Time: 01/23/20  9:02 AM  Result Value Ref Range   TSH 1.69 0.35 - 4.50 uIU/mL  Lipid panel     Status: Abnormal   Collection Time: 01/23/20  9:02 AM  Result Value Ref Range   Cholesterol 213 (H) 0 - 200 mg/dL    Comment: ATP III Classification       Desirable:  < 200 mg/dL               Borderline High:  200 - 239 mg/dL          High:  > = 240 mg/dL   Triglycerides 115.0 0 - 149 mg/dL    Comment: Normal:  <150 mg/dLBorderline High:  150 - 199 mg/dL   HDL 40.30 >39.00 mg/dL   VLDL 23.0 0.0 - 40.0 mg/dL   LDL Cholesterol 150 (H) 0 - 99 mg/dL   Total CHOL/HDL Ratio 5     Comment:                Men          Women1/2 Average Risk     3.4          3.3Average Risk          5.0          4.42X Average Risk  9.6          7.13X Average Risk          15.0          11.0                       NonHDL 172.64     Comment: NOTE:  Non-HDL goal should be 30 mg/dL higher than patient's LDL goal (i.e. LDL goal of < 70 mg/dL, would have non-HDL goal of < 100 mg/dL)  CBC with Differential/Platelet     Status: None   Collection Time: 01/23/20  9:02 AM  Result Value Ref Range   WBC 8.0 4.0 - 10.5 K/uL   RBC 4.56 3.87 - 5.11 Mil/uL   Hemoglobin 14.4 12.0 - 15.0 g/dL   HCT 42.8 36 - 46 %   MCV 93.8 78.0 - 100.0 fl   MCHC 33.7 30.0 - 36.0 g/dL   RDW 13.9 11.5 - 15.5 %   Platelets 373.0 150 - 400 K/uL   Neutrophils Relative % 54.1 43 - 77 %   Lymphocytes Relative 35.1 12 - 46 %   Monocytes Relative 6.9 3 - 12 %   Eosinophils Relative 2.7 0 - 5 %    Basophils Relative 1.2 0 - 3 %   Neutro Abs 4.3 1.4 - 7.7 K/uL   Lymphs Abs 2.8 0.7 - 4.0 K/uL   Monocytes Absolute 0.6 0 - 1 K/uL   Eosinophils Absolute 0.2 0 - 0 K/uL   Basophils Absolute 0.1 0 - 0 K/uL  Comprehensive metabolic panel     Status: Abnormal   Collection Time: 01/23/20  9:02 AM  Result Value Ref Range   Sodium 142 135 - 145 mEq/L   Potassium 4.0 3.5 - 5.1 mEq/L   Chloride 106 96 - 112 mEq/L   CO2 26 19 - 32 mEq/L   Glucose, Bld 106 (H) 70 - 99 mg/dL   BUN 10 6 - 23 mg/dL   Creatinine, Ser 0.94 0.40 - 1.20 mg/dL   Total Bilirubin 0.4 0.2 - 1.2 mg/dL   Alkaline Phosphatase 98 39 - 117 U/L   AST 16 0 - 37 U/L   ALT 16 0 - 35 U/L   Total Protein 7.0 6.0 - 8.3 g/dL   Albumin 4.6 3.5 - 5.2 g/dL   GFR 74.50 >60.00 mL/min   Calcium 9.4 8.4 - 10.5 mg/dL  D-Dimer, Quantitative     Status: None   Collection Time: 04/09/20 10:07 AM  Result Value Ref Range   D-Dimer, Quant 0.23 <0.50 mcg/mL FEU    Comment: . The D-Dimer test is used frequently to exclude an acute PE or DVT. In patients with a low to moderate clinical risk assessment and a D-Dimer result <0.50 mcg/mL FEU, the likelihood of a PE or DVT is very low. However, a thromboembolic event should not be excluded solely on the basis of the D-Dimer level. Increased levels of D-Dimer are associated with a PE, DVT, DIC, malignancies, inflammation, sepsis, surgery, trauma, pregnancy, and advancing patient age. [Jama 2006 11:295(2):199-207] . For additional information, please refer to: http://education.questdiagn[facility].com/faq/FAQ149 (This link is being provided for informational/ educational purposes only) .   Troponin I     Status: None   Collection Time: 04/09/20 10:07 AM  Result Value Ref Range   Troponin I 4 < OR = 47 ng/L    Comment: . In accord with published recommendations, serial testing of troponin I at intervals of 2  to 4 hours for up to 12 to 24 hours is suggested in order to corroborate a single  troponin I result. An elevated troponin alone is not sufficient to make the diagnosis of MI. .   B Nat Peptide     Status: None   Collection Time: 04/09/20 10:07 AM  Result Value Ref Range   Brain Natriuretic Peptide 4 <100 pg/mL    Comment: . BNP levels increase with age in the general population with the highest values seen in individuals greater than 64 years of age. Reference: J. Am. [place] Ar. Cardiol. 2002; 28:003-491. .    Objective  Body mass index is 28.87 kg/m. Wt Readings from Last 3 Encounters:  04/09/20 147 lb 12.8 oz (67 kg)  01/01/20 146 lb (66.2 kg)  10/20/19 142 lb (64.4 kg)   Temp Readings from Last 3 Encounters:  04/09/20 97.9 F (36.6 C) (Oral)  10/20/19 98.2 F (36.8 C) (Oral)  06/22/19 98.2 F (36.8 C) (Oral)   BP Readings from Last 3 Encounters:  04/09/20 130/80  10/20/19 127/82  06/22/19 (!) 144/76   Pulse Readings from Last 3 Encounters:  04/09/20 91  10/20/19 74  06/22/19 70    Physical Exam Vitals and nursing note reviewed.  Constitutional:      Appearance: Normal appearance. She is well-developed and well-groomed.  HENT:     Head: Normocephalic and atraumatic.  Eyes:     Conjunctiva/sclera: Conjunctivae normal.     Pupils: Pupils are equal, round, and reactive to light.  Cardiovascular:     Rate and Rhythm: Normal rate and regular rhythm.     Heart sounds: Murmur heard.   Pulmonary:     Effort: Pulmonary effort is normal.     Breath sounds: Normal breath sounds.  Chest:     Chest wall: No tenderness.  Skin:    General: Skin is warm and moist.  Neurological:     Mental Status: She is alert and oriented to person, place, and time.     Gait: Gait normal.  Psychiatric:        Attention and Perception: Attention and perception normal.        Mood and Affect: Mood and affect normal.        Speech: Speech normal.        Behavior: Behavior normal. Behavior is cooperative.        Cognition and Memory: Cognition and memory normal.      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 Joselyn Arrow for neck and back other ortho w/u CTS  Lumbar radiculopathy - Plan: Ambulatory referral to Orthopedic Surgery, gabapentin (NEURONTIN) 300 MG capsule, methocarbamol (ROBAXIN) 500 MG tablet Last seen by Dr. Sharlet Salina 05/17/18 for now declines injections also referred to NS Dr. Lacinda Axon in the past   Lumbar herniated disc with cervicalgia Abnormal MRI lumbar 03/2018- Plan: Ambulatory referral to Orthopedic Surgery, methocarbamol (ROBAXIN) 500 MG tablet 04/18/18 MRI L spine   FINDINGS: Segmentation:  Normal on the comparisons.  Alignment:  Stable and normal lumbar lordosis.  Vertebrae: No marrow edema or evidence of acute osseous abnormality. Visualized bone marrow signal is within normal limits. Intact visible sacrum and SI joints.  Conus medullaris and cauda equina: Conus extends to the T12 level. Conus and cauda equina appear normal.  Paraspinal and other soft tissues: Stable visible abdominal viscera. Negative visualized posterior paraspinal soft tissues.  Disc levels:  T11-T12: Negative.  T12-L1:  Negative.  L1-L2:  Negative.  L2-L3: Mild disc desiccation. Minimal far lateral disc bulging and mild endplate spurring. No stenosis.  L3-L4: Mild disc desiccation. Minimal far lateral disc bulging and endplate spurring. No stenosis.  L4-L5: Normal disc aside from subtle right foraminal disc  bulge (series 8, image 28). This is in proximity to the exiting right L4 nerve although no significant stenosis results. Mild facet hypertrophy.  L5-S1: Normal disc. Mild facet hypertrophy greater on the left. No stenosis.  IMPRESSION: 1. 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 2. Minimal disc bulging L2-L3 through L4-L5. Capacious underlying spinal canal. Perhaps a right foraminal disc bulge could be a source for right L4 radiculitis, but there is no other neural impingement.   04/09/20 cervical spine Xray  EXAM: CERVICAL SPINE - COMPLETE 4+ VIEW  COMPARISON:  None.  FINDINGS: Five view radiograph of the cervical spine demonstrates straightening of the cervical spine, nonspecific but possibly related to underlying muscular spasm. There is no acute fracture or listhesis of the cervical spine. Vertebral body height and intervertebral disc heights are preserved. The spinal canal is widely patent. The lateral masses of C1 are well aligned with the body of C2. Oblique views fail to demonstrate the neural foramina bilaterally. The prevertebral soft tissues are unremarkable. The visualized lung apices are clear.  IMPRESSION: Negative cervical spine radiographs.   Electronically Signed   By: Fidela Salisbury MD   On: 04/09/2020 23:53  04/09/20 T spine Xray   EXAM: THORACIC SPINE 2 VIEWS  COMPARISON:  None.  FINDINGS: There is no evidence of thoracic spine fracture. Alignment is normal. No other significant bone abnormalities are identified.  IMPRESSION: Negative.   RLS (restless legs syndrome) - Plan: methocarbamol (ROBAXIN) 500 MG tablet   Dysphagia, unspecified type - Plan: Ambulatory referral to Gastroenterology  Photophobia Eye exam [facility]   Insomnia, unspecified type  Address in the future likely due to partner working 2nd shift and waking her up and stress  HM Flu shotdue covid 2/2 pfizer  Tdap ?  Date review prior records  Consider MMR vaccine and hep B vaccine  Hep C neg Consider shingirx   Per pt had HIV test [facility] requested records today   Referred mammogramprev never done overdue last noted 01/13/15 order exp 12/22/20  04/25/18 pap negativepap also h/o fibroids, enlarged cervix, adenaxal mass, trichomonas, mild endometrial thickening referred to Dr. Georgianne Fick bx endometrium neg 05/02/18  Colonoscopy never had discwith GI   Smoker 1ppd now <1/2 Rx nicotine gum rec smoking cessation  Provider: Dr. Olivia Mackie McLean-Scocuzza-[facility]

## 2020-04-09 NOTE — Patient Instructions (Addendum)
Gas X   Gastroesophageal Reflux Disease, Adult Gastroesophageal reflux (GER) happens when acid from the stomach flows up into the tube that connects the mouth and the stomach (esophagus). Normally, food travels down the esophagus and stays in the stomach to be digested. However, when a person has GER, food and stomach acid sometimes move back up into the esophagus. If this becomes a more serious problem, the person may be diagnosed with a disease called gastroesophageal reflux disease (GERD). GERD occurs when the reflux:  Happens often.  Causes frequent or severe symptoms.  Causes problems such as damage to the esophagus. When stomach acid comes in contact with the esophagus, the acid may cause soreness (inflammation) in the esophagus. Over time, GERD may create small holes (ulcers) in the lining of the esophagus. What are the causes? This condition is caused by a problem with the muscle between the esophagus and the stomach (lower esophageal sphincter, or LES). Normally, the LES muscle closes after food passes through the esophagus to the stomach. When the LES is weakened or abnormal, it does not close properly, and that allows food and stomach acid to go back up into the esophagus. The LES can be weakened by certain dietary substances, medicines, and medical conditions, including:  Tobacco use.  Pregnancy.  Having a hiatal hernia.  Alcohol use.  Certain foods and beverages, such as coffee, chocolate, onions, and peppermint. What increases the risk? You are more likely to develop this condition if you:  Have an increased body weight.  Have a connective tissue disorder.  Use NSAID medicines. What are the signs or symptoms? Symptoms of this condition include:  Heartburn.  Difficult or painful swallowing.  The feeling of having a lump in the throat.  Abitter taste in the mouth.  Bad breath.  Having a large amount of saliva.  Having an upset or bloated  stomach.  Belching.  Chest pain. Different conditions can cause chest pain. Make sure you see your health care provider if you experience chest pain.  Shortness of breath or wheezing.  Ongoing (chronic) cough or a night-time cough.  Wearing away of tooth enamel.  Weight loss. How is this diagnosed? Your health care provider will take a medical history and perform a physical exam. To determine if you have mild or severe GERD, your health care provider may also monitor how you respond to treatment. You may also have tests, including:  A test to examine your stomach and esophagus with a small camera (endoscopy).  A test thatmeasures the acidity level in your esophagus.  A test thatmeasures how much pressure is on your esophagus.  A barium swallow or modified barium swallow test to show the shape, size, and functioning of your esophagus. How is this treated? The goal of treatment is to help relieve your symptoms and to prevent complications. Treatment for this condition may vary depending on how severe your symptoms are. Your health care provider may recommend:  Changes to your diet.  Medicine.  Surgery. Follow these instructions at home: Eating and drinking   Follow a diet as recommended by your health care provider. This may involve avoiding foods and drinks such as: ? Coffee and tea (with or without caffeine). ? Drinks that containalcohol. ? Energy drinks and sports drinks. ? Carbonated drinks or sodas. ? Chocolate and cocoa. ? Peppermint and mint flavorings. ? Garlic and onions. ? Horseradish. ? Spicy and acidic foods, including peppers, chili powder, curry powder, vinegar, hot sauces, and barbecue sauce. ? Citrus  fruit juices and citrus fruits, such as oranges, lemons, and limes. ? Tomato-based foods, such as red sauce, chili, salsa, and pizza with red sauce. ? Fried and fatty foods, such as donuts, french fries, potato chips, and high-fat dressings. ? High-fat  meats, such as hot dogs and fatty cuts of red and white meats, such as rib eye steak, sausage, ham, and bacon. ? High-fat dairy items, such as whole milk, butter, and cream cheese.  Eat small, frequent meals instead of large meals.  Avoid drinking large amounts of liquid with your meals.  Avoid eating meals during the 2-3 hours before bedtime.  Avoid lying down right after you eat.  Do not exercise right after you eat. Lifestyle   Do not use any products that contain nicotine or tobacco, such as cigarettes, e-cigarettes, and chewing tobacco. If you need help quitting, ask your health care provider.  Try to reduce your stress by using methods such as yoga or meditation. If you need help reducing stress, ask your health care provider.  If you are overweight, reduce your weight to an amount that is healthy for you. Ask your health care provider for guidance about a safe weight loss goal. General instructions  Pay attention to any changes in your symptoms.  Take over-the-counter and prescription medicines only as told by your health care provider. Do not take aspirin, ibuprofen, or other NSAIDs unless your health care provider told you to do so.  Wear loose-fitting clothing. Do not wear anything tight around your waist that causes pressure on your abdomen.  Raise (elevate) the head of your bed about 6 inches (15 cm).  Avoid bending over if this makes your symptoms worse.  Keep all follow-up visits as told by your health care provider. This is important. Contact a health care provider if:  You have: ? New symptoms. ? Unexplained weight loss. ? Difficulty swallowing or it hurts to swallow. ? Wheezing or a persistent cough. ? A hoarse voice.  Your symptoms do not improve with treatment. Get help right away if you:  Have pain in your arms, neck, jaw, teeth, or back.  Feel sweaty, dizzy, or light-headed.  Have chest pain or shortness of breath.  Vomit and your vomit looks  like blood or coffee grounds.  Faint.  Have stool that is bloody or black.  Cannot swallow, drink, or eat. Summary  Gastroesophageal reflux happens when acid from the stomach flows up into the esophagus. GERD is a disease in which the reflux happens often, causes frequent or severe symptoms, or causes problems such as damage to the esophagus.  Treatment for this condition may vary depending on how severe your symptoms are. Your health care provider may recommend diet and lifestyle changes, medicine, or surgery.  Contact a health care provider if you have new or worsening symptoms.  Take over-the-counter and prescription medicines only as told by your health care provider. Do not take aspirin, ibuprofen, or other NSAIDs unless your health care provider told you to do so.  Keep all follow-up visits as told by your health care provider. This is important. This information is not intended to replace advice given to you by your health care provider. Make sure you discuss any questions you have with your health care provider. Document Revised: 01/16/2018 Document Reviewed: 01/16/2018 Elsevier Patient Education  2020 [place] for Gastroesophageal Reflux Disease, Adult When you have gastroesophageal reflux disease (GERD), the foods you eat and your eating habits are very important.  Choosing the right foods can help ease the discomfort of GERD. Consider working with a diet and nutrition specialist (dietitian) to help you make healthy food choices. What general guidelines should I follow?  Eating plan  Choose healthy foods low in fat, such as fruits, vegetables, whole grains, low-fat dairy products, and lean meat, fish, and poultry.  Eat frequent, small meals instead of three large meals each day. Eat your meals slowly, in a relaxed setting. Avoid bending over or lying down until 2-3 hours after eating.  Limit high-fat foods such as fatty meats or fried foods.  Limit your  intake of oils, butter, and shortening to less than 8 teaspoons each day.  Avoid the following: ? Foods that cause symptoms. These may be different for different people. Keep a food diary to keep track of foods that cause symptoms. ? Alcohol. ? Drinking large amounts of liquid with meals. ? Eating meals during the 2-3 hours before bed.  Cook foods using methods other than frying. This may include baking, grilling, or broiling. Lifestyle  Maintain a healthy weight. Ask your health care provider what weight is healthy for you. If you need to lose weight, work with your health care provider to do so safely.  Exercise for at least 30 minutes on 5 or more days each week, or as told by your health care provider.  Avoid wearing clothes that fit tightly around your waist and chest.  Do not use any products that contain nicotine or tobacco, such as cigarettes and e-cigarettes. If you need help quitting, ask your health care provider.  Sleep with the head of your bed raised. Use a wedge under the mattress or blocks under the bed frame to raise the head of the bed. What foods are not recommended? The items listed may not be a complete list. Talk with your dietitian about what dietary choices are best for you. Grains Pastries or quick breads with added fat. [place] toast. Vegetables Deep fried vegetables. [place] fries. Any vegetables prepared with added fat. Any vegetables that cause symptoms. For some people this may include tomatoes and tomato products, chili peppers, onions and garlic, and horseradish. Fruits Any fruits prepared with added fat. Any fruits that cause symptoms. For some people this may include citrus fruits, such as oranges, grapefruit, pineapple, and lemons. Meats and other protein foods High-fat meats, such as fatty beef or pork, hot dogs, ribs, ham, sausage, salami and bacon. Fried meat or protein, including fried fish and fried chicken. Nuts and nut butters. Dairy Whole milk  and chocolate milk. Sour cream. Cream. Ice cream. Cream cheese. Milk shakes. Beverages Coffee and tea, with or without caffeine. Carbonated beverages. Sodas. Energy drinks. Fruit juice made with acidic fruits (such as orange or grapefruit). Tomato juice. Alcoholic drinks. Fats and oils Butter. Margarine. Shortening. Ghee. Sweets and desserts Chocolate and cocoa. Donuts. Seasoning and other foods Pepper. Peppermint and spearmint. Any condiments, herbs, or seasonings that cause symptoms. For some people, this may include curry, hot sauce, or vinegar-based salad dressings. Summary  When you have gastroesophageal reflux disease (GERD), food and lifestyle choices are very important to help ease the discomfort of GERD.  Eat frequent, small meals instead of three large meals each day. Eat your meals slowly, in a relaxed setting. Avoid bending over or lying down until 2-3 hours after eating.  Limit high-fat foods such as fatty meat or fried foods. This information is not intended to replace advice given to you by your health care provider.  Make sure you discuss any questions you have with your health care provider. Document Revised: 10/31/2018 Document Reviewed: 07/11/2016 Elsevier Patient Education  [place].  Angina  Angina is extreme discomfort in the chest, neck, arm, jaw, or back. The discomfort is caused by a lack of blood in the middle layer of the heart wall (myocardium). There are four types of angina:  Stable angina. This is triggered by vigorous activity or exercise. It goes away when you rest or take angina medicine.  Unstable angina. This is a warning sign and can lead to a heart attack (acute coronary syndrome). This is a medical emergency. Symptoms come at rest and last a long time.  Microvascular angina. This affects the small coronary arteries. Symptoms include feeling tired and being short of breath.  Prinzmetal or variant angina. This is caused by a tightening  (spasm) of the arteries that go to your heart. What are the causes? This condition is caused by atherosclerosis. This is the buildup of fat and cholesterol (plaque) in your arteries. The plaque may narrow or block the artery. Other causes of angina include:  Sudden tightening of the muscles of the arteries in the heart (coronary spasm).  Small artery disease (microvascular dysfunction).  Problems with any of your heart valves (heart valve disease).  A tear in an artery in your heart (coronary artery dissection).  Diseases of the heart muscle (cardiomyopathy), or other heart diseases. What increases the risk? You are more likely to develop this condition if you have:  High cholesterol.  High blood pressure (hypertension).  Diabetes.  A family history of heart disease.  An inactive (sedentary) lifestyle, or you do not exercise enough.  Depression.  Had radiation treatment to the left side of your chest. Other risk factors include:  Using tobacco.  Being obese.  Eating a diet high in saturated fats.  Being exposed to high stress or triggers of stress.  Using drugs, such as cocaine. Women have a greater risk for angina if:  They are older than 57.  They have gone through menopause (are postmenopausal). What are the signs or symptoms? Common symptoms of this condition in both men and women may include:  Chest pain, which may: ? Feel like a crushing or squeezing in the chest, or like a tightness, pressure, fullness, or heaviness in the chest. ? Last for more than a few minutes at a time, or it may stop and come back (recur) over the course of a few minutes.  Pain in the neck, arm, jaw, or back.  Unexplained heartburn or indigestion.  Shortness of breath.  Nausea.  Sudden cold sweats. Women and people with diabetes may have unusual (atypical) symptoms, such as:  Fatigue.  Unexplained feelings of nervousness or anxiety.  Unexplained weakness.  Dizziness  or fainting. How is this diagnosed? This condition may be diagnosed based on:  Your symptoms and medical history.  Electrocardiogram (ECG) to measure the electrical activity in your heart.  Blood tests.  Stress test to look for signs of blockage when your heart is stressed.  CT angiogram to examine your heart and the blood flow to it.  Coronary angiogram to check your coronary arteries for blockage. How is this treated? Angina may be treated with:  Medicines to: ? Prevent blood clots and heart attack. ? Relax blood vessels and improve blood flow to the heart (nitrates). ? Reduce blood pressure, improve the pumping action of the heart, and relax blood vessels that are spasming. ? Reduce  cholesterol and help treat atherosclerosis.  A procedure to widen a narrowed or blocked coronary artery (angioplasty). A mesh tube may be placed in a coronary artery to keep it open (coronary stenting).  Surgery to allow blood to go around a blocked artery (coronary artery bypass surgery). Follow these instructions at home: Medicines  Take over-the-counter and prescription medicines only as told by your health care provider.  Do not take the following medicines unless your health care provider approves: ? NSAIDs, such as ibuprofen or naproxen. ? Vitamin supplements that contain vitamin A, vitamin E, or both. ? Hormone replacement therapy that contains estrogen with or without progestin. Eating and drinking   Eat a heart-healthy diet. This includes plenty of fresh fruits and vegetables, whole grains, low-fat (lean) protein, and low-fat dairy products.  Follow instructions from your health care provider about eating or drinking restrictions. Activity  Follow an exercise program approved by your health care provider.  Consider joining a cardiac rehabilitation program.  Take a break when you feel fatigued. Plan rest periods in your daily activities. Lifestyle   Do not use any products  that contain nicotine or tobacco, such as cigarettes, e-cigarettes, and chewing tobacco. If you need help quitting, ask your health care provider.  If your health care provider says you can drink alcohol: ? Limit how much you use to:  0-1 drink a day for nonpregnant women.  0-2 drinks a day for men. ? Be aware of how much alcohol is in your drink. In the [place] one drink equals one 12 oz bottle of beer (355 mL), one 5 oz glass of wine (148 mL), or one 1 oz glass of hard liquor (44 mL). General instructions  Maintain a healthy weight.  Learn to manage stress.  Keep your vaccinations up to date. Get the flu (influenza) vaccine every year.  Talk to your health care provider if you feel depressed. Take a depression screening test to see if you are at risk for depression.  Work with your health care provider to manage other health conditions, such as hypertension or diabetes.  Keep all follow-up visits as told by your health care provider. This is important. Get help right away if:  You have pain in your chest, neck, arm, jaw, or back, and the pain: ? Lasts more than a few minutes. ? Is recurring. ? Is not relieved by taking medicines under the tongue (sublingual nitroglycerin). ? Increases in intensity or frequency.  You have a lot of sweating without cause.  You have unexplained: ? Heartburn or indigestion. ? Shortness of breath or difficulty breathing. ? Nausea or vomiting. ? Fatigue. ? Feelings of nervousness or anxiety. ? Weakness.  You have sudden light-headedness or dizziness.  You faint. These symptoms may represent a serious problem that is an emergency. Do not wait to see if the symptoms will go away. Get medical help right away. Call your local emergency services (911 in the [place] Do not drive yourself to the hospital. Summary  Angina is extreme discomfort in the chest, neck, arm, jaw, or back that is caused by a lack of blood in the heart wall.  There are many  symptoms of angina. They include chest pain, unexplained heartburn or indigestion, sudden cold sweats, and fatigue.  Angina may be treated with behavioral changes, medicine, or surgery.  Symptoms of angina may represent an emergency. Get medical help right away. Call your local emergency services (911 in the [place] Do not drive yourself to the hospital. This  information is not intended to replace advice given to you by your health care provider. Make sure you discuss any questions you have with your health care provider. Document Revised: 02/25/2018 Document Reviewed: 02/25/2018 Elsevier Patient Education  [place].  Nonspecific Chest Pain, Adult Chest pain can be caused by many different conditions. It can be caused by a condition that is life-threatening and requires treatment right away. It can also be caused by something that is not life-threatening. If you have chest pain, it can be hard to know the difference, so it is important to get help right away to make sure that you do not have a serious condition. Some life-threatening causes of chest pain include:  Heart attack.  A tear in the body's main blood vessel (aortic dissection).  Inflammation around your heart (pericarditis).  A problem in the lungs, such as a blood clot (pulmonary embolism) or a collapsed lung (pneumothorax). Some non life-threatening causes of chest pain include:  Heartburn.  Anxiety or stress.  Damage to the bones, muscles, and cartilage that make up your chest wall.  Pneumonia or bronchitis.  Shingles infection (varicella-zoster virus). Chest pain can feel like:  Pain or discomfort on the surface of your chest or deep in your chest.  Crushing, pressure, aching, or squeezing pain.  Burning or tingling.  Dull or sharp pain that is worse when you move, cough, or take a deep breath.  Pain or discomfort that is also felt in your back, neck, jaw, shoulder, or arm, or pain that spreads to any  of these areas. Your chest pain may come and go. It may also be constant. Your health care provider will do lab tests and other studies to find the cause of your pain. Treatment will depend on the cause of your chest pain. Follow these instructions at home: Medicines  Take over-the-counter and prescription medicines only as told by your health care provider.  If you were prescribed an antibiotic, take it as told by your health care provider. Do not stop taking the antibiotic even if you start to feel better. Lifestyle   Rest as directed by your health care provider.  Do not use any products that contain nicotine or tobacco, such as cigarettes and e-cigarettes. If you need help quitting, ask your health care provider.  Do not drink alcohol.  Make healthy lifestyle choices as recommended. These may include: ? Getting regular exercise. Ask your health care provider to suggest some activities that are safe for you. ? Eating a heart-healthy diet. This includes plenty of fresh fruits and vegetables, whole grains, low-fat (lean) protein, and low-fat dairy products. A dietitian can help you find healthy eating options. ? Maintaining a healthy weight. ? Managing any other health conditions you have, such as high blood pressure (hypertension) or diabetes. ? Reducing stress, such as with yoga or relaxation techniques. General instructions  Pay attention to any changes in your symptoms. Tell your health care provider about them or any new symptoms.  Avoid any activities that cause chest pain.  Keep all follow-up visits as told by your health care provider. This is important. This includes visits for any further testing if your chest pain does not go away. Contact a health care provider if:  Your chest pain does not go away.  You feel depressed.  You have a fever. Get help right away if:  Your chest pain gets worse.  You have a cough that gets worse, or you cough up blood.  You  have  severe pain in your abdomen.  You faint.  You have sudden, unexplained chest discomfort.  You have sudden, unexplained discomfort in your arms, back, neck, or jaw.  You have shortness of breath at any time.  You suddenly start to sweat, or your skin gets clammy.  You feel nausea or you vomit.  You suddenly feel lightheaded or dizzy.  You have severe weakness, or unexplained weakness or fatigue.  Your heart begins to beat quickly, or it feels like it is skipping beats. These symptoms may represent a serious problem that is an emergency. Do not wait to see if the symptoms will go away. Get medical help right away. Call your local emergency services (911 in the [place] Do not drive yourself to the hospital. Summary  Chest pain can be caused by a condition that is serious and requires urgent treatment. It may also be caused by something that is not life-threatening.  If you have chest pain, it is very important to see your health care provider. Your health care provider may do lab tests and other studies to find the cause of your pain.  Follow your health care provider's instructions on taking medicines, making lifestyle changes, and getting emergency treatment if symptoms become worse.  Keep all follow-up visits as told by your health care provider. This includes visits for any further testing if your chest pain does not go away. This information is not intended to replace advice given to you by your health care provider. Make sure you discuss any questions you have with your health care provider. Document Revised: 01/10/2018 Document Reviewed: 01/10/2018 Elsevier Patient Education  2020 [employer].

## 2020-04-09 NOTE — Progress Notes (Signed)
Patient presenting with intermittent chest pain. Rated 10/10 pain. Ongoing for over a week. States the pain is in the center of her chest and feels like something is sitting on her heart.

## 2020-04-19 ENCOUNTER — Ambulatory Visit: Payer: 59 | Admitting: Cardiology

## 2020-04-19 DIAGNOSIS — M542 Cervicalgia: Secondary | ICD-10-CM | POA: Insufficient documentation

## 2020-04-19 DIAGNOSIS — M5416 Radiculopathy, lumbar region: Secondary | ICD-10-CM | POA: Insufficient documentation

## 2020-04-19 DIAGNOSIS — R937 Abnormal findings on diagnostic imaging of other parts of musculoskeletal system: Secondary | ICD-10-CM | POA: Insufficient documentation

## 2020-04-26 ENCOUNTER — Telehealth: Payer: Self-pay

## 2020-04-26 NOTE — Telephone Encounter (Signed)
I called and spoke with the patient and informed her to call and schedule her mammogram at [facility] and I gave her the number to call and she understood.  Mya Suell,cma

## 2020-04-26 NOTE — Telephone Encounter (Signed)
-----   Message from Delorise Jackson, MD sent at 04/19/2020  4:56 PM EDT ----- Give pt [place] # to call and schedule mammogram [email or web address]

## 2020-05-03 ENCOUNTER — Telehealth: Payer: Self-pay | Admitting: Internal Medicine

## 2020-05-03 NOTE — Telephone Encounter (Signed)
Rejection Reason - Patient was No Show - Patient no called no showed to her scheduled appointment 05/03/20 This patient has not contacted [place] back to schedule. Referral is being closed due to time sensitivity but pt has our info to call and schedule. We will still be happy to assist your office and the patient. Thank you!" [facility] [place] said on May 03, 2020 12:52 PM

## 2020-05-04 ENCOUNTER — Ambulatory Visit: Payer: 59 | Admitting: Cardiology

## 2020-05-24 ENCOUNTER — Ambulatory Visit: Payer: 59 | Admitting: Cardiology

## 2020-06-07 ENCOUNTER — Ambulatory Visit: Payer: 59 | Admitting: Cardiology

## 2020-06-08 ENCOUNTER — Encounter: Payer: Self-pay | Admitting: Cardiology

## 2020-06-10 ENCOUNTER — Other Ambulatory Visit: Payer: Self-pay

## 2020-06-10 ENCOUNTER — Encounter: Payer: Self-pay | Admitting: Nurse Practitioner

## 2020-06-10 ENCOUNTER — Telehealth (INDEPENDENT_AMBULATORY_CARE_PROVIDER_SITE_OTHER): Payer: Self-pay | Admitting: Nurse Practitioner

## 2020-06-10 VITALS — Temp 99.2°F | Ht 60.0 in | Wt 147.0 lb

## 2020-06-10 DIAGNOSIS — J209 Acute bronchitis, unspecified: Secondary | ICD-10-CM

## 2020-06-10 DIAGNOSIS — J45909 Unspecified asthma, uncomplicated: Secondary | ICD-10-CM

## 2020-06-10 DIAGNOSIS — Z20822 Contact with and (suspected) exposure to covid-19: Secondary | ICD-10-CM

## 2020-06-10 MED ORDER — SACCHAROMYCES BOULARDII 250 MG PO CAPS: 250.0000 mg | ORAL_CAPSULE | Freq: Two times a day (BID) | ORAL | 0 refills | Status: AC

## 2020-06-10 MED ORDER — BENZONATATE 200 MG PO CAPS: 200.0000 mg | ORAL_CAPSULE | Freq: Three times a day (TID) | ORAL | 0 refills | Status: DC | PRN

## 2020-06-10 MED ORDER — DEXTROMETHORPHAN-GUAIFENESIN 5-100 MG/5ML PO LIQD: 10.0000 mL | Freq: Every evening | ORAL | 0 refills | Status: DC | PRN

## 2020-06-10 MED ORDER — PREDNISONE 10 MG PO TABS: ORAL_TABLET | ORAL | 0 refills | Status: DC

## 2020-06-10 MED ORDER — DOXYCYCLINE HYCLATE 100 MG PO TABS: 100.0000 mg | ORAL_TABLET | Freq: Two times a day (BID) | ORAL | 0 refills | Status: DC

## 2020-06-10 NOTE — Progress Notes (Addendum)
Virtual Visit via video and converted to tele Note  This visit type was conducted due to national recommendations for restrictions regarding the COVID-19 pandemic (e.g. social distancing).  This format is felt to be most appropriate for this patient at this time.  All issues noted in this document were discussed and addressed.  No physical exam was performed (except for noted visual exam findings with Video Visits).   I connected with@ on 06/14/20 at  4:30 PM EST by a video enabled telemedicine application or telephone and verified that I am speaking with the correct person using two identifiers. Location patient: home Location provider: work or home office Persons participating in the virtual visit: patient, provider  I discussed the limitations, risks, security and privacy concerns of performing an evaluation and management service by telephone and the availability of in person appointments. I also discussed with the patient that there may be a patient responsible charge related to this service. The patient expressed understanding and agreed to proceed.  Interactive audio and video telecommunications were attempted between this provider and patient, however failed, due to patient having technical difficulties.  We continued and completed visit with audio only.   Reason for visit: Patient with chills, nasal congestion, headache body ache, onset Saturday no response to over-the-counter medications.  HPI: This 54 year old patient with history of chronic sinusitis, allergic rhinitis, GERD, low back pain, tobacco use disorder, prediabetes, Covid infection in 2020 now  reports onset last Thursday, 06/03/2020 of headache, body aches, and she thought she had a sinus infection from working in a dusty environment.    Last night, she had cold chills, runny nose, decreased energy, headache has remained intermittent, little nausea with the postnasal drainage, and T-max 99.2.  Still having some body aches.   Not a lot of sinus symptoms but is coughing up green/brown sputum for the last 2 days.  She thinks her shortness of breath is a little worse and her asthma is starting up again. She has albuterol to use but has not had wheezing so she has not used her inhaler. She has coughing mostly in the mornings and some at night. Mild left  inner ear symptom as well, but no ear pain. She thinks she has bronchitis now.   She has tried over-the-counter sinus medication, Tylenol, and continues to take her Claritin, Flonase at bedtime, Singulair at bedtime, and has not been taking any cough medicine.   ROS: See pertinent positives and negatives per HPI.  Past Medical History:  Diagnosis Date  . Asthma   . Back pain   . Chronic sinusitis   . GERD (gastroesophageal reflux disease)   . Heart murmur   . Leaky heart valve   . Migraine   . Migraine   . Trichomonas infection     Past Surgical History:  Procedure Laterality Date  . BREAST SURGERY     left breast abcess    Family History  Problem Relation Age of Onset  . Asthma Mother   . COPD Mother   . Diabetes Mother   . Miscarriages / [place] Mother   . Diabetes Father   . Heart disease Father   . Hypertension Father   . Heart failure Father   . Heart attack Father   . Asthma Daughter   . Miscarriages / [place] Daughter   . Arthritis Maternal Grandmother   . Asthma Maternal Grandmother   . Hyperlipidemia Maternal Grandmother   . Diabetes Maternal Grandfather   . Stroke Paternal Grandmother   .  Heart disease Brother     SOCIAL HX:  Patient has been on Chantix and has been successful of decreasing her tobacco down to 5/day.   Current Outpatient Medications:  .  acetaminophen (TYLENOL) 500 MG tablet, Take 1 tablet (500 mg total) by mouth every 4 (four) hours as needed., Disp: 90 tablet, Rfl: 3 .  albuterol (VENTOLIN HFA) 108 (90 Base) MCG/ACT inhaler, Inhale 1-2 puffs into the lungs every 4 (four) hours as needed for wheezing or  shortness of breath., Disp: 18 g, Rfl: 11 .  atorvastatin (LIPITOR) 10 MG tablet, Take 1 tablet (10 mg total) by mouth daily. At night, Disp: 90 tablet, Rfl: 3 .  fluticasone (FLONASE) 50 MCG/ACT nasal spray, Place 2 sprays into both nostrils daily. Prn max 2 sprays, Disp: 16 g, Rfl: 6 .  loratadine (CLARITIN) 10 MG tablet, Take 1 tablet (10 mg total) by mouth daily as needed for allergies., Disp: 90 tablet, Rfl: 3 .  meclizine (ANTIVERT) 25 MG tablet, Take 1 tablet (25 mg total) by mouth 3 (three) times daily as needed for dizziness., Disp: 30 tablet, Rfl: 0 .  methocarbamol (ROBAXIN) 500 MG tablet, Take 1 tablet (500 mg total) by mouth 2 (two) times daily as needed for muscle spasms., Disp: 60 tablet, Rfl: 5 .  montelukast (SINGULAIR) 10 MG tablet, Take 1 tablet (10 mg total) by mouth at bedtime., Disp: 90 tablet, Rfl: 3 .  pantoprazole (PROTONIX) 40 MG tablet, Take 1 tablet (40 mg total) by mouth daily. 30 min before, Disp: 90 tablet, Rfl: 3 .  traZODone (DESYREL) 50 MG tablet, Take 0.5-1 tablets (25-50 mg total) by mouth at bedtime as needed for sleep., Disp: 30 tablet, Rfl: 5 .  benzonatate (TESSALON) 200 MG capsule, Take 1 capsule (200 mg total) by mouth 3 (three) times daily as needed for up to 7 days for cough., Disp: 21 capsule, Rfl: 0 .  Dextromethorphan-guaiFENesin 5-100 MG/5ML LIQD, Take 10 mLs by mouth at bedtime as needed (for cough)., Disp: 70 mL, Rfl: 0 .  doxycycline (VIBRA-TABS) 100 MG tablet, Take 1 tablet (100 mg total) by mouth 2 (two) times daily., Disp: 14 tablet, Rfl: 0 .  predniSONE (DELTASONE) 10 MG tablet, Take 6 tablets (60 mg total) on day 1; decrease by 1 tablet (10 mg total) each day until off., Disp: 21 tablet, Rfl: 0 .  saccharomyces boulardii (FLORASTOR) 250 MG capsule, Take 1 capsule (250 mg total) by mouth 2 (two) times daily for 14 days., Disp: 28 capsule, Rfl: 0  EXAM:  VITALS per patient if applicable: Weight 370 lbs, temp 99.2, no pulse oximeter.  GENERAL:  Sounds alert, oriented, and in no acute distress.   HEENT: Positive nasal congested tone to her voice  LUNGS: No signs of respiratory distress but she is coughing frequently throughout the conversation.  No audible wheezing.  Able to complete sentences without breathiness.   CV: no obvious cyanosis  PSYCH/NEURO: pleasant and cooperative, no obvious depression or anxiety, speech and thought processing grossly intact  ASSESSMENT AND PLAN:  Discussed the following assessment and plan:  Bronchitis with asthma, acute  Suspected COVID-19 virus infection  Covid test today  Tessalon Perles as directed for cough  Mucinex Dm as directed at bedtime  Albuterol  inhaler as instructed  Stay  on your usual asthma meds.   If no improvement in symptoms begin  Doxy 100 mg twice daily   Florastor to prevent diarrhea while on the antibiotic doxycycline.   Advised patient on  supportive measures:  Get rest, drink plenty of fluids, and use tylenol or ibuprofen as needed for pain. Follow up if fever >101, if symptoms worsen or if symptoms are not improved in 3 days.  Work note seen today and may return to work on Monday if well.   Get help right away if you:  Cough up blood.  Feel pain in your chest.  Have severe shortness of breath.  Faint or keep feeling like you are going to faint.  Have a severe headache.  Have fever or chills that get worse. These symptoms may represent a serious problem that is an emergency. Do not wait to see if the symptoms will go away. Get medical help right away. Call your local emergency services (911 in the [place] Do not drive yourself to the hospital.     I discussed the assessment and treatment plan with the patient. The patient was provided an opportunity to ask questions and all were answered. The patient agreed with the plan and demonstrated an understanding of the instructions.   The patient was advised to call back or seek an in-person evaluation if  the symptoms worsen or if the condition fails to improve as anticipated.  I provided 22 minutes of non-face-to-face telephone time  Denice Paradise, NP Adult Nurse Practitioner [place] [phone]

## 2020-06-10 NOTE — Patient Instructions (Addendum)
Covid test today  Teresa Deleon as directed for cough  Mucinex Dm as directed at bedtime  Albuterol  inhaler as instructed  Stay  on your usual asthma meds.   If no improvement in symptoms begin  Doxy 100 mg twice daily   Florastor to prevent diarrhea while on the antibiotic doxycycline.   Advised patient on supportive measures:  Get rest, drink plenty of fluids, and use tylenol or ibuprofen as needed for pain. Follow up if fever >101, if symptoms worsen or if symptoms are not improved in 3 days.  Work note seen today and may return to work on Monday if well.   Get help right away if you:  Cough up blood.  Feel pain in your chest.  Have severe shortness of breath.  Faint or keep feeling like you are going to faint.  Have a severe headache.  Have fever or chills that get worse. These symptoms may represent a serious problem that is an emergency. Do not wait to see if the symptoms will go away. Get medical help right away. Call your local emergency services (911 in the [place] Do not drive yourself to the hospital.   Acute Bronchitis, Adult  Acute bronchitis is sudden or acute swelling of the air tubes (bronchi) in the lungs. Acute bronchitis causes these tubes to fill with mucus, which can make it hard to breathe. It can also cause coughing or wheezing. In adults, acute bronchitis usually goes away within 2 weeks. A cough caused by bronchitis may last up to 3 weeks. Smoking, allergies, and asthma can make the condition worse. What are the causes? This condition can be caused by germs and by substances that irritate the lungs, including:  Cold and flu viruses. The most common cause of this condition is the virus that causes the common cold.  Bacteria.  Substances that irritate the lungs, including: ? Smoke from cigarettes and other forms of tobacco. ? Dust and pollen. ? Fumes from chemical products, gases, or burned fuel. ? Other materials that pollute indoor or outdoor  air.  Close contact with someone who has acute bronchitis. What increases the risk? The following factors may make you more likely to develop this condition:  A weak body's defense system, also called the immune system.  A condition that affects your lungs and breathing, such as asthma. What are the signs or symptoms? Common symptoms of this condition include:  Lung and breathing problems, such as: ? Coughing. This may bring up clear, yellow, or green mucus from your lungs (sputum). ? Wheezing. ? Having too much mucus in your lungs (chest congestion). ? Having shortness of breath.  A fever.  Chills.  Aches and pains, including: ? Tightness in your chest and other body aches. ? A sore throat. How is this diagnosed? This condition is usually diagnosed based on:  Your symptoms and medical history.  A physical exam. You may also have other tests, including tests to rule out other conditions, such pneumonia. These tests include:  A test of lung function.  Test of a mucus sample to look for the presence of bacteria.  Tests to check the oxygen level in your blood.  Blood tests.  Chest X-ray. How is this treated? Most cases of acute bronchitis clear up over time without treatment. Your health care provider may recommend:  Drinking more fluids. This can thin your mucus, which may improve your breathing.  Taking a medicine for a fever or cough.  Using a device that  gets medicine into your lungs (inhaler) to help improve breathing and control coughing.  Using a vaporizer or a humidifier. These are machines that add water to the air to help you breathe better. Follow these instructions at home: Activity  Get plenty of rest.  Return to your normal activities as told by your health care provider. Ask your health care provider what activities are safe for you. Lifestyle  Drink enough fluid to keep your urine pale yellow.  Do not drink alcohol.  Do not use any products  that contain nicotine or tobacco, such as cigarettes, e-cigarettes, and chewing tobacco. If you need help quitting, ask your health care provider. Be aware that: ? Your bronchitis will get worse if you smoke or breathe in other people's smoke (secondhand smoke). ? Your lungs will heal faster if you quit smoking. General instructions   Take over-the-counter and prescription medicines only as told by your health care provider.  Use an inhaler, vaporizer, or humidifier as told by your health care provider.  If you have a sore throat, gargle with a salt-water mixture 3-4 times a day or as needed. To make a salt-water mixture, completely dissolve -1 tsp (3-6 g) of salt in 1 cup (237 mL) of warm water.  Keep all follow-up visits as told by your health care provider. This is important. How is this prevented? To lower your risk of getting this condition again:  Wash your hands often with soap and water. If soap and water are not available, use hand sanitizer.  Avoid contact with people who have cold symptoms.  Try not to touch your mouth, nose, or eyes with your hands.  Avoid places where there are fumes from chemicals. Breathing these fumes will make your condition worse.  Get the flu shot every year. Contact a health care provider if:  Your symptoms do not improve after 2 weeks of treatment.  You vomit more than once or twice.  You have symptoms of dehydration such as: ? Dark urine. ? Dry skin or eyes. ? Increased thirst. ? Headaches. ? Confusion. ? Muscle cramps. Get help right away if you:  Cough up blood.  Feel pain in your chest.  Have severe shortness of breath.  Faint or keep feeling like you are going to faint.  Have a severe headache.  Have fever or chills that get worse. These symptoms may represent a serious problem that is an emergency. Do not wait to see if the symptoms will go away. Get medical help right away. Call your local emergency services (911 in the  [place] Do not drive yourself to the hospital. Summary  Acute bronchitis is sudden (acute) inflammation of the air tubes (bronchi) between the windpipe and the lungs. In adults, acute bronchitis usually goes away within 2 weeks, although coughing may last 3 weeks or longer  Take over-the-counter and prescription medicines only as told by your health care provider.  Drink enough fluid to keep your urine pale yellow.  Contact a health care provider if your symptoms do not improve after 2 weeks of treatment.  Get help right away if you cough up blood, faint, or have chest pain or shortness of breath. This information is not intended to replace advice given to you by your health care provider. Make sure you discuss any questions you have with your health care provider. Document Revised: 03/24/2019 Document Reviewed: 01/31/2019 Elsevier Patient Education  Teresa Deleon.  [email or web address].org/conditions-and-treatments/asthma">    Asthma Attack  Acute bronchospasm caused by  asthma is also referred to as an asthma attack. Bronchospasm means that the air passages become narrowed or "tight," which limits the amount of oxygen that can get into the lungs. The narrowing is caused by inflammation and tightening of the muscles in the air tubes (bronchi) in the lungs. Excessive mucus is also produced, which narrows the airways more. This can cause trouble breathing, coughing, and loud breathing (wheezing). What are the causes? Possible triggers include:  Animal dander from the skin, hair, or feathers of animals.  Dust mites contained in house dust.  Cockroaches.  Pollen from trees or grass.  Mold.  Cigarette or tobacco smoke.  Air pollutants such as dust, household cleaners, hair sprays, aerosol sprays, paint fumes, strong chemicals, or strong odors.  Cold air or weather changes. Cold air may trigger inflammation. Winds increase molds and pollens in the air.  Strong emotions such as  crying or laughing hard.  Stress.  Certain medicines, such as aspirin or beta-blockers.  Sulfites in foods and drinks, such as dried fruits and wine.  Infections or inflammatory conditions, such as a flu, a cold, pneumonia, or inflammation of the nasal membranes (rhinitis).  Gastroesophageal reflux disease (GERD). GERD is a condition in which stomach acid backs up into your esophagus, which can irritate nearby airway structures.  Exercise or activity that requires a lot of energy. What are the signs or symptoms? Symptoms of this condition include:  Wheezing. This may sound like whistling while breathing. This may be more noticeable at night.  Excessive coughing, particularly at night.  Chest tightness or pain.  Shortness of breath.  Feeling like you cannot get enough air no matter how hard you try (air hunger). How is this diagnosed? This condition may be diagnosed based on:  Your medical history.  Your symptoms.  A physical exam.  Tests to check for other causes of your symptoms or other conditions that may have triggered your asthma attack. These tests may include: ? Chest X-ray. ? Blood tests. ? Specialized tests to assess lung function, such as breathing into a device that measures how much air you inhale and exhale (spirometry). How is this treated? The goal of treatment is to open the airways in your lungs and reduce inflammation. Most asthma attacks are treated with medicines that you inhale through a hand-held inhaler (metered dose inhaler, MDI) or a device that turns liquid medicine into a mist that you inhale (nebulizer). Medicines may include:  Quick relief or rescue medicines that relax the muscles of the bronchi. These medicines include bronchodilators, such as albuterol.  Controller medicines, such as inhaled corticosteroids. These are long-acting medicines that are used for daily asthma maintenance. If you have a moderate or severe asthma attack, you may be  treated with steroid medicines by mouth or through an IV injection at the hospital. Steroid medicines reduce inflammation in your lungs. Depending on the severity of your attack, you may need oxygen therapy to help you breathe. If your asthma attack was caused by a bacterial infection, such as pneumonia, you will be given antibiotic medicines. Follow these instructions at home: Medicines  Take over-the-counter and prescription medicines only as told by your health care provider. Keep your medicines up-to-date and available.  If you are more than [redacted] weeks pregnant and you are prescribed any new medicines, tell your obstetrician about those medicines.  If you were prescribed an antibiotic medicine, take it as told by your health care provider. Do not stop taking the antibiotic even if  you start to feel better. Avoiding triggers   Keep track of things that trigger your asthma attacks or cause you to have breathing problems, and avoid exposure to these triggers.  Do not use any products that contain nicotine or tobacco, such as cigarettes and e-cigarettes. If you need help quitting, ask your health care provider.  Avoid secondhand smoke.  Avoid strong smells, such as perfumes, aerosols, and cleaning solvents.  When pollen or air pollution is bad, keep windows closed and use an air conditioner or go to places with air conditioning. Asthma action plan  Work with your health care provider to make a written plan for managing and treating your asthma attacks (asthma action plan). This plan should include: ? A list of your asthma triggers and how to avoid them. ? Information about when your medicines should be taken and when their dosage should be changed. ? Instructions about using a device called a peak flow meter to monitor your condition. A peak flow meter measures how well your lungs are working and measures how severe your asthma is at a given time. Your "personal best" is the highest peak  flow rate you can reach when you feel good and have no asthma symptoms. General instructions  Avoid excessive exercise or activity until your asthma attack resolves. Ask your health care provider what activities are safe for you and when you can return to your normal activities.  Stay up to date on all vaccinations recommended by your health care provider, such as flu and pneumonia vaccines.  Drink enough fluid to keep your urine clear or pale yellow. Staying hydrated helps keep mucus in your lungs thin so it can be coughed up easily.  If you drink caffeine, do so in moderation.  Do not use alcohol until you have recovered.  Keep all follow-up visits as told by your health care provider. This is important. Asthma requires careful medical care, and you and your health care provider can work together to reduce the likelihood of future attacks. Contact a health care provider if:  Your peak flow reading is still at 50-79% of your personal best after you have followed your action plan for 1 hour. This is in the yellow zone, which means "caution."  You need to use a reliever medicine more than 2-3 times a week.  Your medicines are causing side effects, such as: ? Rash. ? Itching. ? Swelling. ? Trouble breathing.  Your symptoms do not improve after 48 hours.  You cough up mucus (sputum) that is thicker than usual.  You have a fever.  You need to use your medicines much more frequently than normal. Get help right away if:  Your peak flow reading is less than 50% of your personal best. This is in the red zone, which means "danger."  You have severe trouble breathing.  You develop chest pain or discomfort.  Your medicines no longer seem to be helping.  You vomit.  You cannot eat or drink without vomiting.  You are coughing up yellow, green, brown, or bloody mucus.  You have a fever and your symptoms suddenly get worse.  You have trouble swallowing.  You feel very tired, and  breathing becomes tiring. Summary  Acute bronchospasm caused by asthma is also referred to as an asthma attack.  Bronchospasm is caused by narrowing or tightness in air passages, which causes shortness of breath, coughing, and loud breathing (wheezing).  Many things can trigger an asthma attack, such as allergens, weather changes,  exercise, smoke, and other fumes.  Treatment for an asthma attack may include inhaled rescue medicines for immediate relief, as well as the use of maintenance therapy.  Get help right away if you have worsening shortness of breath, chest pain, or fever, or if your home medicines are no longer helping with your symptoms. This information is not intended to replace advice given to you by your health care provider. Make sure you discuss any questions you have with your health care provider. Document Revised: 10/29/2018 Document Reviewed: 08/11/2016 Elsevier Patient Education  [place].

## 2020-06-12 ENCOUNTER — Encounter: Payer: Self-pay | Admitting: Nurse Practitioner

## 2020-06-12 DIAGNOSIS — J069 Acute upper respiratory infection, unspecified: Secondary | ICD-10-CM | POA: Insufficient documentation

## 2020-06-12 DIAGNOSIS — Z20822 Contact with and (suspected) exposure to covid-19: Secondary | ICD-10-CM | POA: Insufficient documentation

## 2020-06-12 DIAGNOSIS — J45909 Unspecified asthma, uncomplicated: Secondary | ICD-10-CM | POA: Insufficient documentation

## 2020-06-12 DIAGNOSIS — J209 Acute bronchitis, unspecified: Secondary | ICD-10-CM | POA: Insufficient documentation

## 2020-06-15 ENCOUNTER — Encounter: Payer: Self-pay | Admitting: Nurse Practitioner

## 2020-06-15 ENCOUNTER — Other Ambulatory Visit: Payer: Self-pay

## 2020-06-15 ENCOUNTER — Telehealth (INDEPENDENT_AMBULATORY_CARE_PROVIDER_SITE_OTHER): Payer: Self-pay | Admitting: Nurse Practitioner

## 2020-06-15 VITALS — Ht 60.0 in | Wt 147.0 lb

## 2020-06-15 DIAGNOSIS — J209 Acute bronchitis, unspecified: Secondary | ICD-10-CM

## 2020-06-15 DIAGNOSIS — J45909 Unspecified asthma, uncomplicated: Secondary | ICD-10-CM

## 2020-06-15 NOTE — Patient Instructions (Addendum)
You reported note negative Covid test done at [facility].  Thank you for getting this test done.  Continue with doxycycline 100 mg twice daily as ordered.  Please add a probiotic since you are on the antibiotic to help keep risk of diarrhea and low.  Continue with prednisone taper as ordered.  Continue with Tessalon Perles for cough as needed.  Please use the albuterol inhaler as needed.   This is an excellent time for you to quit smoking.  Since you have no desire for cigarettes at this time.   Smoking cessation information provided with [place] quit line, 1 [phone] and call for more information,   Consider chewing nicotine gum or lozenges.  You would use the lowest dose possible 2 mg dose and may use higher dose if needed.    Weeks 1-6: use 1 piece every 1-2 hours.   Weeks 7-8 use 1 piece every 2-4 hours  Weeks 10-12 1 piece every 4-8 hours Duration of therapy is 12 weeks.  Follow-up if no resolution of new bronchitis.  Work note provided to be out of work until 06/23/2020.  Contact a health care provider if:  Your symptoms do not improve after 2 weeks of treatment.  You vomit more than once or twice.  You have symptoms of dehydration such as: ? Dark urine. ? Dry skin or eyes. ? Increased thirst. ? Headaches. ? Confusion. ? Muscle cramps. Get help right away if you:  Cough up blood.  Feel pain in your chest.  Have severe shortness of breath.  Faint or keep feeling like you are going to faint.  Have a severe headache.  Have fever or chills that get worse.    Acute Bronchitis, Adult  Acute bronchitis is sudden or acute swelling of the air tubes (bronchi) in the lungs. Acute bronchitis causes these tubes to fill with mucus, which can make it hard to breathe. It can also cause coughing or wheezing. In adults, acute bronchitis usually goes away within 2 weeks. A cough caused by bronchitis may last up to 3 weeks. Smoking, allergies, and asthma can make the condition  worse. What are the causes? This condition can be caused by germs and by substances that irritate the lungs, including:  Cold and flu viruses. The most common cause of this condition is the virus that causes the common cold.  Bacteria.  Substances that irritate the lungs, including: ? Smoke from cigarettes and other forms of tobacco. ? Dust and pollen. ? Fumes from chemical products, gases, or burned fuel. ? Other materials that pollute indoor or outdoor air.  Close contact with someone who has acute bronchitis. What increases the risk? The following factors may make you more likely to develop this condition:  A weak body's defense system, also called the immune system.  A condition that affects your lungs and breathing, such as asthma. What are the signs or symptoms? Common symptoms of this condition include:  Lung and breathing problems, such as: ? Coughing. This may bring up clear, yellow, or green mucus from your lungs (sputum). ? Wheezing. ? Having too much mucus in your lungs (chest congestion). ? Having shortness of breath.  A fever.  Chills.  Aches and pains, including: ? Tightness in your chest and other body aches. ? A sore throat. How is this diagnosed? This condition is usually diagnosed based on:  Your symptoms and medical history.  A physical exam. You may also have other tests, including tests to rule out other conditions, such  pneumonia. These tests include:  A test of lung function.  Test of a mucus sample to look for the presence of bacteria.  Tests to check the oxygen level in your blood.  Blood tests.  Chest X-ray. How is this treated? Most cases of acute bronchitis clear up over time without treatment. Your health care provider may recommend:  Drinking more fluids. This can thin your mucus, which may improve your breathing.  Taking a medicine for a fever or cough.  Using a device that gets medicine into your lungs (inhaler) to help  improve breathing and control coughing.  Using a vaporizer or a humidifier. These are machines that add water to the air to help you breathe better. Follow these instructions at home: Activity  Get plenty of rest.  Return to your normal activities as told by your health care provider. Ask your health care provider what activities are safe for you. Lifestyle  Drink enough fluid to keep your urine pale yellow.  Do not drink alcohol.  Do not use any products that contain nicotine or tobacco, such as cigarettes, e-cigarettes, and chewing tobacco. If you need help quitting, ask your health care provider. Be aware that: ? Your bronchitis will get worse if you smoke or breathe in other people's smoke (secondhand smoke). ? Your lungs will heal faster if you quit smoking. General instructions   Take over-the-counter and prescription medicines only as told by your health care provider.  Use an inhaler, vaporizer, or humidifier as told by your health care provider.  If you have a sore throat, gargle with a salt-water mixture 3-4 times a day or as needed. To make a salt-water mixture, completely dissolve -1 tsp (3-6 g) of salt in 1 cup (237 mL) of warm water.  Keep all follow-up visits as told by your health care provider. This is important. How is this prevented? To lower your risk of getting this condition again:  Wash your hands often with soap and water. If soap and water are not available, use hand sanitizer.  Avoid contact with people who have cold symptoms.  Try not to touch your mouth, nose, or eyes with your hands.  Avoid places where there are fumes from chemicals. Breathing these fumes will make your condition worse.  Get the flu shot every year. These symptoms may represent a serious problem that is an emergency. Do not wait to see if the symptoms will go away. Get medical help right away. Call your local emergency services (911 in the [place] Do not drive yourself to the  hospital. Summary  Acute bronchitis is sudden (acute) inflammation of the air tubes (bronchi) between the windpipe and the lungs. In adults, acute bronchitis usually goes away within 2 weeks, although coughing may last 3 weeks or longer  Take over-the-counter and prescription medicines only as told by your health care provider.  Drink enough fluid to keep your urine pale yellow.  Contact a health care provider if your symptoms do not improve after 2 weeks of treatment.  Get help right away if you cough up blood, faint, or have chest pain or shortness of breath. This information is not intended to replace advice given to you by your health care provider. Make sure you discuss any questions you have with your health care provider. Document Revised: 03/24/2019 Document Reviewed: 01/31/2019 Elsevier Patient Education  [place].

## 2020-06-15 NOTE — Progress Notes (Signed)
Virtual Visit via Video Note  This visit type was conducted due to national recommendations for restrictions regarding the COVID-19 pandemic (e.g. social distancing).  This format is felt to be most appropriate for this patient at this time.  All issues noted in this document were discussed and addressed.  No physical exam was performed (except for noted visual exam findings with Video Visits).   I connected with@ on 06/16/20 at  8:30 AM EST by a video enabled telemedicine application or telephone and verified that I am speaking with the correct person using two identifiers. Location patient: home Location provider: work or home office Persons participating in the virtual visit: patient, provider  I discussed the limitations, risks, security and privacy concerns of performing an evaluation and management service by telephone and the availability of in person appointments. I also discussed with the patient that there may be a patient responsible charge related to this service. The patient expressed understanding and agreed to proceed.   Reason for visit: Follow up URI symptoms.   HPI: This 54 year old patient with history  of chronic sinusitis, allergic rhinitis, GERD, low back pain, tobacco use disorder, prediabetes, Covid infection in 2020 was seen by video visit on 06/10/2020 for onset the week prior of headache, body aches, cough with brown purulent discharge, and sinus congestion with low grade fevers. [facility]   She started to the doxycycline, prednisone taper 3 days ago.  She is taking Tessalon Perles twice daily.  She has noted increasing improvement in her current symptoms.  She has not purchased a probiotic, the Mucinex DM.  Patient still has a runny nose mostly in the evening, but she is blowing out more clear drainage, coughing up less sputum, and slight shortness of breath is better. Using albuterol as needed.  No chest pain.  No fevers or chills.  She is  tolerating her diet well without any diarrhea.  Couple days she felt slightly nauseated and vomited once-but that has resolved.    Patient has smoked only 1 cigarette in 24 hours.  She wants to quit smoking altogether and we discussed how this is an excellent opportunity.  We discussed nicotine gum if she feels like she needs cigarettes.  We discussed how to use the nicotine gum.  She has remained out of work and reports  her boss told her to stay out for 10 days.  She is hoping to go back to work next Wednesday on 06/23/2020.  ROS: See pertinent positives and negatives per HPI.  Past Medical History:  Diagnosis Date  . Asthma   . Back pain   . Chronic sinusitis   . GERD (gastroesophageal reflux disease)   . Heart murmur   . Leaky heart valve   . Migraine   . Migraine   . Trichomonas infection     Past Surgical History:  Procedure Laterality Date  . BREAST SURGERY     left breast abcess    Family History  Problem Relation Age of Onset  . Asthma Mother   . COPD Mother   . Diabetes Mother   . Miscarriages / [place] Mother   . Diabetes Father   . Heart disease Father   . Hypertension Father   . Heart failure Father   . Heart attack Father   . Asthma Daughter   . Miscarriages / [place] Daughter   . Arthritis Maternal Grandmother   . Asthma Maternal Grandmother   . Hyperlipidemia Maternal Grandmother   .  Diabetes Maternal Grandfather   . Stroke Paternal Grandmother   . Heart disease Brother     SOCIAL HX: Positive tobacco smoking   Current Outpatient Medications:  .  acetaminophen (TYLENOL) 500 MG tablet, Take 1 tablet (500 mg total) by mouth every 4 (four) hours as needed., Disp: 90 tablet, Rfl: 3 .  albuterol (VENTOLIN HFA) 108 (90 Base) MCG/ACT inhaler, Inhale 1-2 puffs into the lungs every 4 (four) hours as needed for wheezing or shortness of breath., Disp: 18 g, Rfl: 11 .  atorvastatin (LIPITOR) 10 MG tablet, Take 1 tablet (10 mg total) by mouth daily. At  night, Disp: 90 tablet, Rfl: 3 .  Dextromethorphan-guaiFENesin 5-100 MG/5ML LIQD, Take 10 mLs by mouth at bedtime as needed (for cough)., Disp: 70 mL, Rfl: 0 .  doxycycline (VIBRA-TABS) 100 MG tablet, Take 1 tablet (100 mg total) by mouth 2 (two) times daily., Disp: 14 tablet, Rfl: 0 .  fluticasone (FLONASE) 50 MCG/ACT nasal spray, Place 2 sprays into both nostrils daily. Prn max 2 sprays, Disp: 16 g, Rfl: 6 .  loratadine (CLARITIN) 10 MG tablet, Take 1 tablet (10 mg total) by mouth daily as needed for allergies., Disp: 90 tablet, Rfl: 3 .  meclizine (ANTIVERT) 25 MG tablet, Take 1 tablet (25 mg total) by mouth 3 (three) times daily as needed for dizziness., Disp: 30 tablet, Rfl: 0 .  methocarbamol (ROBAXIN) 500 MG tablet, Take 1 tablet (500 mg total) by mouth 2 (two) times daily as needed for muscle spasms., Disp: 60 tablet, Rfl: 5 .  montelukast (SINGULAIR) 10 MG tablet, Take 1 tablet (10 mg total) by mouth at bedtime., Disp: 90 tablet, Rfl: 3 .  pantoprazole (PROTONIX) 40 MG tablet, Take 1 tablet (40 mg total) by mouth daily. 30 min before, Disp: 90 tablet, Rfl: 3 .  predniSONE (DELTASONE) 10 MG tablet, Take 6 tablets (60 mg total) on day 1; decrease by 1 tablet (10 mg total) each day until off., Disp: 21 tablet, Rfl: 0 .  saccharomyces boulardii (FLORASTOR) 250 MG capsule, Take 1 capsule (250 mg total) by mouth 2 (two) times daily for 14 days., Disp: 28 capsule, Rfl: 0 .  traZODone (DESYREL) 50 MG tablet, Take 0.5-1 tablets (25-50 mg total) by mouth at bedtime as needed for sleep., Disp: 30 tablet, Rfl: 5  EXAM:  VITALS per patient if applicable:wt 158 lbs  GENERAL: alert, oriented, appears looking better and in no acute distress  HEENT: atraumatic, conjunctiva clear, no obvious abnormalities on inspection of external nose and ears Less congested tone to voice.   NECK: normal movements of the head and neck  LUNGS: on inspection no signs of respiratory distress, breathing rate appears  normal, no obvious gross SOB, gasping or wheezing. No cough noted.   CV: no obvious cyanosis  MS: moves all visible extremities without noticeable abnormality  PSYCH/NEURO: pleasant and cooperative, no obvious depression or anxiety, speech and thought processing grossly intact  ASSESSMENT AND PLAN:  Discussed the following assessment and plan:  Bronchitis with asthma, acute ou reported note [facility]  Thank you for getting this test done.  Continue with doxycycline 100 mg twice daily as ordered.  Please add a probiotic since you are on the antibiotic to help keep risk of diarrhea and low.  Continue with prednisone taper as ordered.  Continue with Tessalon Perles for cough as needed.  Please use the albuterol inhaler as needed.    Discussed the importance of tobacco cessation, benefits  of quitting, implications of continued use. Provided patient with information on 1-800-QUIT-NOW  This is an excellent time for you to quit smoking.  Since you have no desire for cigarettes at this time.   Smoking cessation information provided with [place] quit line, 1 [phone] and call for more information,   Consider chewing nicotine gum or lozenges.  You would use the lowest dose possible 2 mg dose and may use higher dose if needed.    Weeks 1-6: use 1 piece every 1-2 hours.   Weeks 7-8 use 1 piece every 2-4 hours  Weeks 10-12 1 piece every 4-8 hours Duration of therapy is 12 weeks.  Follow-up if no resolution of new bronchitis.  Work note provided to be out of work until 06/23/2020.  I discussed the assessment and treatment plan with the patient. The patient was provided an opportunity to ask questions and all were answered. The patient agreed with the plan and demonstrated an understanding of the instructions.   The patient was advised to call back or seek an in-person evaluation if the symptoms worsen or if the condition fails to improve as anticipated. Denice Paradise,  NP Adult Nurse Practitioner [place] [phone]

## 2020-06-22 ENCOUNTER — Telehealth: Payer: Self-pay | Admitting: Internal Medicine

## 2020-06-22 NOTE — Telephone Encounter (Signed)
She is still having to use her inhaler more than normal- x4 per day and having DOE. She was advised to get an in-person evaluation at [facility] .  She is in agreement with this plan.

## 2020-06-22 NOTE — Telephone Encounter (Signed)
Patient states she is doing better. Still have some congestion and cough. Does still use her inhaler about 4 times a day. She got covid test done the day after her appt and it was negative. [facility]

## 2020-06-22 NOTE — Telephone Encounter (Signed)
Pt called she saw Maudie Mercury last week and needs an extension on coming back to work  New date needs to be 06/28/20  Please call pt back with any question

## 2020-06-22 NOTE — Telephone Encounter (Signed)
Please call her for sx update as she wants to go back to work. Did she gt a Covid test?

## 2020-06-23 ENCOUNTER — Ambulatory Visit (INDEPENDENT_AMBULATORY_CARE_PROVIDER_SITE_OTHER): Payer: Self-pay

## 2020-06-23 ENCOUNTER — Ambulatory Visit
Admission: EM | Admit: 2020-06-23 | Discharge: 2020-06-23 | Disposition: A | Discharge status: Home / Self Care | Payer: BC Managed Care – PPO | Attending: Family Medicine | Admitting: Family Medicine

## 2020-06-23 DIAGNOSIS — J4551 Severe persistent asthma with (acute) exacerbation: Secondary | ICD-10-CM

## 2020-06-23 DIAGNOSIS — J4541 Moderate persistent asthma with (acute) exacerbation: Secondary | ICD-10-CM

## 2020-06-23 DIAGNOSIS — R059 Cough, unspecified: Secondary | ICD-10-CM | POA: Diagnosis not present

## 2020-06-23 DIAGNOSIS — R0602 Shortness of breath: Secondary | ICD-10-CM

## 2020-06-23 DIAGNOSIS — J069 Acute upper respiratory infection, unspecified: Secondary | ICD-10-CM | POA: Diagnosis not present

## 2020-06-23 MED ORDER — LEVOFLOXACIN 500 MG PO TABS: 500.0000 mg | ORAL_TABLET | Freq: Every day | ORAL | 0 refills | Status: DC

## 2020-06-23 MED ORDER — IPRATROPIUM-ALBUTEROL 0.5-2.5 (3) MG/3ML IN SOLN: 3.0000 mL | RESPIRATORY_TRACT | 0 refills | Status: DC | PRN

## 2020-06-23 NOTE — ED Triage Notes (Signed)
Pt states she was around a lot of dust at work on week of 11/11.  Shortly after, began noticing worsening asthma s/s.  Was tx via virtual visit for URI.  Given abx, Tessalon Perle and steroid.  Still having trouble breathing and using inhaler QID.  PCP referred to [facility] for further eval.  Continued productive cough, bright green mucous.  [facility].

## 2020-06-23 NOTE — Telephone Encounter (Signed)
Pt dropped off disability forms to be filled out and faxed. Placed in folder up front. Please call when complete

## 2020-06-23 NOTE — ED Provider Notes (Signed)
[place]   [id number] 06/23/20 Arrival Time: 6546   CC: COVID symptoms  SUBJECTIVE: History from: patient.  Teresa Deleon is a 55 y.o. female who presents with continued cough and shortness of breath. Has had televisit with PCP and was treated with doxycycline, steroids and inhalers. Denies sick exposure to COVID, flu or strep. Denies recent travel. Has previous history of Covid. Has completed Covid vaccines. Has not taken OTC medications for this. Symptoms are worsened with exertion. Denies previous symptoms in the past. Denies fever, chills, sinus pain, rhinorrhea, sore throat, nausea, changes in bowel or bladder habits.    ROS: As per HPI.  All other pertinent ROS negative.     Past Medical History:  Diagnosis Date  . Asthma   . Back pain   . Chronic sinusitis   . GERD (gastroesophageal reflux disease)   . Heart murmur   . Leaky heart valve   . Migraine   . Migraine   . Trichomonas infection    Past Surgical History:  Procedure Laterality Date  . BREAST SURGERY     left breast abcess   Allergies  Allergen Reactions  . Shellfish Allergy Itching   No current facility-administered medications on file prior to encounter.   Current Outpatient Medications on File Prior to Encounter  Medication Sig Dispense Refill  . acetaminophen (TYLENOL) 500 MG tablet Take 1 tablet (500 mg total) by mouth every 4 (four) hours as needed. 90 tablet 3  . albuterol (VENTOLIN HFA) 108 (90 Base) MCG/ACT inhaler Inhale 1-2 puffs into the lungs every 4 (four) hours as needed for wheezing or shortness of breath. 18 g 11  . atorvastatin (LIPITOR) 10 MG tablet Take 1 tablet (10 mg total) by mouth daily. At night 90 tablet 3  . Dextromethorphan-guaiFENesin 5-100 MG/5ML LIQD Take 10 mLs by mouth at bedtime as needed (for cough). 70 mL 0  . fluticasone (FLONASE) 50 MCG/ACT nasal spray Place 2 sprays into both nostrils daily. Prn max 2 sprays 16 g 6  . loratadine (CLARITIN) 10 MG tablet  Take 1 tablet (10 mg total) by mouth daily as needed for allergies. 90 tablet 3  . meclizine (ANTIVERT) 25 MG tablet Take 1 tablet (25 mg total) by mouth 3 (three) times daily as needed for dizziness. 30 tablet 0  . methocarbamol (ROBAXIN) 500 MG tablet Take 1 tablet (500 mg total) by mouth 2 (two) times daily as needed for muscle spasms. 60 tablet 5  . montelukast (SINGULAIR) 10 MG tablet Take 1 tablet (10 mg total) by mouth at bedtime. 90 tablet 3  . pantoprazole (PROTONIX) 40 MG tablet Take 1 tablet (40 mg total) by mouth daily. 30 min before 90 tablet 3  . saccharomyces boulardii (FLORASTOR) 250 MG capsule Take 1 capsule (250 mg total) by mouth 2 (two) times daily for 14 days. 28 capsule 0  . traZODone (DESYREL) 50 MG tablet Take 0.5-1 tablets (25-50 mg total) by mouth at bedtime as needed for sleep. 30 tablet 5  . doxycycline (VIBRA-TABS) 100 MG tablet Take 1 tablet (100 mg total) by mouth 2 (two) times daily. 14 tablet 0  . predniSONE (DELTASONE) 10 MG tablet Take 6 tablets (60 mg total) on day 1; decrease by 1 tablet (10 mg total) each day until off. 21 tablet 0   Social History   Socioeconomic History  . Marital status: Single    Spouse name: Not on file  . Number of children: Not on file  . Years  of education: Not on file  . Highest education level: Not on file  Occupational History  . Not on file  Tobacco Use  . Smoking status: Current Some Day Smoker    Packs/day: 0.50    Types: Cigarettes  . Smokeless tobacco: Never Used  . Tobacco comment: 1 ppd   Vaping Use  . Vaping Use: Never used  Substance and Sexual Activity  . Alcohol use: Yes    Comment: occassionally  . Drug use: No  . Sexual activity: Yes    Comment: men  Other Topics Concern  . Not on file  Social History Narrative   GED   Works [place]    2 kids    No guns, wears selt belt, safe in relationship    Social Determinants of Health   Financial Resource Strain:   . Difficulty of Paying  Living Expenses: Not on file  Food Insecurity:   . Worried About Charity fundraiser in the Last Year: Not on file  . Ran Out of Food in the Last Year: Not on file  Transportation Needs:   . Lack of Transportation (Medical): Not on file  . Lack of Transportation (Non-Medical): Not on file  Physical Activity:   . Days of Exercise per Week: Not on file  . Minutes of Exercise per Session: Not on file  Stress:   . Feeling of Stress : Not on file  Social Connections:   . Frequency of Communication with Friends and Family: Not on file  . Frequency of Social Gatherings with Friends and Family: Not on file  . Attends Religious Services: Not on file  . Active Member of Clubs or Organizations: Not on file  . Attends Archivist Meetings: Not on file  . Marital Status: Not on file  Intimate Partner Violence:   . Fear of Current or Ex-Partner: Not on file  . Emotionally Abused: Not on file  . Physically Abused: Not on file  . Sexually Abused: Not on file   Family History  Problem Relation Age of Onset  . Asthma Mother   . COPD Mother   . Diabetes Mother   . Miscarriages / [place] Mother   . Diabetes Father   . Heart disease Father   . Hypertension Father   . Heart failure Father   . Heart attack Father   . Asthma Daughter   . Miscarriages / [place] Daughter   . Arthritis Maternal Grandmother   . Asthma Maternal Grandmother   . Hyperlipidemia Maternal Grandmother   . Diabetes Maternal Grandfather   . Stroke Paternal Grandmother   . Heart disease Brother     OBJECTIVE:  Vitals:   06/23/20 1529  BP: 132/89  Pulse: 85  Resp: 18  Temp: 98.4 F (36.9 C)  TempSrc: Oral  SpO2: 94%     General appearance: alert; appears fatigued, but nontoxic; speaking in full sentences and tolerating own secretions HEENT: NCAT; Ears: EACs clear, TMs pearly gray; Eyes: PERRL.  EOM grossly intact. Sinuses: nontender; Nose: nares patent without rhinorrhea, Throat: oropharynx  mildly erythematous, tonsils non erythematous or enlarged, uvula midline  Neck: supple without LAD Lungs: unlabored respirations, symmetrical air entry; cough: moderate; no respiratory distress; diminished lung sounds in bilateral bases, wheezing to upper lungs bilaterally Heart: regular rate and rhythm.  Radial pulses 2+ symmetrical bilaterally Skin: warm and dry Psychological: alert and cooperative; normal mood and affect  LABS:  No results found for this or  any previous visit (from the past 24 hour(s)).   ASSESSMENT & PLAN:  1. Upper respiratory tract infection, unspecified type   2. Cough   3. SOB (shortness of breath)   4. Severe persistent asthma with acute exacerbation     Meds ordered this encounter  Medications  . levofloxacin (LEVAQUIN) 500 MG tablet    Sig: Take 1 tablet (500 mg total) by mouth daily.    Dispense:  7 tablet    Refill:  0    Order Specific Question:   Supervising Provider    Answer:   Chase Picket [id number]  . ipratropium-albuterol (DUONEB) 0.5-2.5 (3) MG/3ML SOLN    Sig: Take 3 mLs by nebulization every 4 (four) hours as needed.    Dispense:  360 mL    Refill:  0    Order Specific Question:   Supervising Provider    Answer:   Chase Picket [[id number]]   Chest xray today negative Had negative Covid test  Declines Covid testing today Will treat for URI with brnchitis Prescribed Levaquin 500mg  once daily x 7 days Nebulizer machine given in office Duoneb solution prescribed via nebulizer every 4 hours as needed for cough, SOB, wheezing Get plenty of rest and push fluids Use OTC zyrtec for nasal congestion, runny nose, and/or sore throat Use OTC flonase for nasal congestion and runny nose Use medications daily for symptom relief Use OTC medications like ibuprofen or tylenol as needed fever or pain Call or go to the ED if you have any new or worsening symptoms such as fever, worsening cough, shortness of breath, chest tightness, chest pain,  turning blue, changes in mental status.  Reviewed expectations re: course of current medical issues. Questions answered. Outlined signs and symptoms indicating need for more acute intervention. Patient verbalized understanding. After Visit Summary given.         Faustino Congress, NP 06/24/20 1250

## 2020-06-23 NOTE — Discharge Instructions (Addendum)
Xray today shows no pneumonia or abnormalities  I have sent in duoneb for you to use at home every 4 hours as needed  I have also sent in Levaquin for you to take once daily for 7 days  If symptoms acutely worsen, follow up in the ER

## 2020-06-23 NOTE — Telephone Encounter (Signed)
Please advise 

## 2020-06-24 ENCOUNTER — Ambulatory Visit: Payer: 59 | Admitting: Gastroenterology

## 2020-06-24 ENCOUNTER — Other Ambulatory Visit: Payer: Self-pay

## 2020-06-25 ENCOUNTER — Telehealth (INDEPENDENT_AMBULATORY_CARE_PROVIDER_SITE_OTHER): Payer: BC Managed Care – PPO | Admitting: Gastroenterology

## 2020-06-25 DIAGNOSIS — Z5329 Procedure and treatment not carried out because of patient's decision for other reasons: Secondary | ICD-10-CM

## 2020-06-25 NOTE — Progress Notes (Signed)
No show

## 2020-06-28 ENCOUNTER — Encounter: Payer: Self-pay | Admitting: Internal Medicine

## 2020-06-28 ENCOUNTER — Telehealth: Payer: Self-pay | Admitting: Internal Medicine

## 2020-06-28 NOTE — Telephone Encounter (Signed)
Called and informed the Patient that letter was in Patient's chart for being out of work until 06/30/20.   Asked the Patient if she would like to pick this up or have this mailed. States she wanted this included with her FMLA paper work.   Informed the Patient that this paperwork takes 14-17 days to complete as we discussed this morning. Patient states she needed the paperwork on 07/04/20. Informed her that we only received this paperwork on 06/23/20 and there is a 2 week minimum on extensive paperwork.  Patient verbalized understanding and will inform her job.

## 2020-06-28 NOTE — Telephone Encounter (Signed)
Patient calling back in and states that she was taken out of work for an upper respiratory infection.  She was seen by urgent care 06/23/20, notes in chart, and received a nebulizer machine and some medications.  Patient states that her job involves a lot of walking and she still gets winded just walking to her mailbox.  Patient states that her bosses do not want her using the nebulizer at work and would like for her to get a note to be out for a few more days so that she can come back to work without it.   Patient was due to return to work today, Please advise

## 2020-06-30 ENCOUNTER — Encounter: Payer: Self-pay | Admitting: Gastroenterology

## 2020-06-30 ENCOUNTER — Ambulatory Visit: Payer: BC Managed Care – PPO | Admitting: Gastroenterology

## 2020-07-05 ENCOUNTER — Telehealth: Payer: Self-pay | Admitting: Internal Medicine

## 2020-07-05 NOTE — Telephone Encounter (Signed)
Patient called to check on paperwork

## 2020-07-06 NOTE — Telephone Encounter (Signed)
Please advise 

## 2020-07-07 ENCOUNTER — Telehealth: Payer: Self-pay | Admitting: Internal Medicine

## 2020-07-07 DIAGNOSIS — Z0279 Encounter for issue of other medical certificate: Secondary | ICD-10-CM

## 2020-07-07 NOTE — Telephone Encounter (Signed)
Patient called back again stated if she does not get paperwork she going to loose her job because she was denied

## 2020-07-07 NOTE — Telephone Encounter (Signed)
Patient says that she is about to loose her job because short term disability papers have not been received.

## 2020-07-07 NOTE — Telephone Encounter (Signed)
Please call pt about paperwork for return to work sent to [employer]

## 2020-07-08 NOTE — Telephone Encounter (Signed)
Spoke with the Patient yesterday afternoon at 5:00 pm. She gave me the number for her leave agent Algis Greenhouse.  [phone] and informed them that the delay in paperwork was due to Dr Olivia Mackie McLean-Scocuzza having clinic all week. She did not originally see this Patient and put her out of work. Patient saw Dawson Bills whose last day was 11/30. Paperwork was received 06/28/20. Dr Olivia Mackie McLean-Scocuzza is having to review the chart and work this paperwork in.

## 2020-07-08 NOTE — Telephone Encounter (Signed)
Patient called back again about paperwork stated that her supervisor ask her about it and she could lose her job

## 2020-07-08 NOTE — Telephone Encounter (Signed)
Please advise on completion of paperwork?

## 2020-07-08 NOTE — Telephone Encounter (Signed)
Patient called checking on her paperwork. Patient was advised that Dr. Claris Gladden assistant will call her tomorrow, (07/09/2020). Patient was also advised that Dr. Aundra Dubin was currently working on her paper work.

## 2020-07-09 NOTE — Telephone Encounter (Signed)
Paperwork completed and faxed to Patient's job.  Patient informed and verbalized understanding, copy of paperwork sent to scan and placed upfront

## 2020-07-09 NOTE — Telephone Encounter (Signed)
Papers ready  Does she want to try symbicort?  See lung specialist for breathing/asthma?   Rec stop smoking

## 2020-07-09 NOTE — Telephone Encounter (Signed)
Patient paperwork faxed to her job and Patient informed to come and pick up original. Patient no longer complaining of any symptoms.

## 2020-07-12 NOTE — Telephone Encounter (Signed)
Pt called and wanted to let Dr. Olivia Mackie know that her job denied her short term claim  Pt thinks they Denied it because form was filled out after 07/04/20. She said that she has not had a chance to call her job today to find out the reason because she has been taking care of her mother

## 2020-07-13 NOTE — Telephone Encounter (Signed)
Called and Patient answered the phone. Confirmed this was the Patient, who I was, and where I was calling from. Asked the Patient if she knew why the paperwork was declined and she hung up the phone.   Nothing further needed.

## 2020-07-20 ENCOUNTER — Ambulatory Visit: Payer: 59 | Admitting: Internal Medicine

## 2020-07-20 DIAGNOSIS — Z0289 Encounter for other administrative examinations: Secondary | ICD-10-CM

## 2020-08-03 ENCOUNTER — Other Ambulatory Visit: Payer: Self-pay

## 2020-08-03 ENCOUNTER — Ambulatory Visit
Admission: EM | Admit: 2020-08-03 | Discharge: 2020-08-03 | Disposition: A | Discharge status: Home / Self Care | Payer: BC Managed Care – PPO | Attending: Sports Medicine | Admitting: Sports Medicine

## 2020-08-03 DIAGNOSIS — Z20822 Contact with and (suspected) exposure to covid-19: Secondary | ICD-10-CM | POA: Diagnosis not present

## 2020-08-03 DIAGNOSIS — B349 Viral infection, unspecified: Secondary | ICD-10-CM | POA: Diagnosis not present

## 2020-08-03 NOTE — Discharge Instructions (Addendum)
Isolate at home until the results of your COVID test are back.  If you test positive you have exceeded the quarantine timeframe as your symptoms have been going on longer than 5 days.  You will be free to go back to work when she gets a result as long as you mask while around others.  Use Tylenol and ibuprofen as needed for any pain.  If you develop any shortness of breath-especially at rest, and inability to speak full sentences, worsening abdominal pain, or fever ED go the ER for evaluation.

## 2020-08-03 NOTE — ED Triage Notes (Signed)
Patient presents to Urgent Care with complaints of HA, sore throat, abdominal pain since x 1 week. Patient reports mother lives in same household and exposed to son who tested positive for covid. She has a hx of allergies unsure if symptoms are related to allergies. Treating at home with inhaler and neb treatment.   Denies fever.

## 2020-08-03 NOTE — ED Provider Notes (Signed)
[facility]    CSN: [id number] Arrival date & time: 08/03/20  0945      History   Chief Complaint No chief complaint on file.   HPI Teresa Deleon is a 55 y.o. female.   HPI   55 year old female here for evaluation of headache, sore throat, and lower abdominal pain this been going on for the past week.  Patient also has an associated nasal congestion and runny nose but states that this is not an interval change as she has allergies.  Patient denies fever, ear pain or pressure, sore throat, cough or shortness of breath, GI complaints, body aches, or changes to her sense of taste or smell.  Patient has had COVID and 2 vaccine series but not her booster.  Patient's son recently tested positive for COVID.  Past Medical History:  Diagnosis Date  . Asthma   . Back pain   . Chronic sinusitis   . GERD (gastroesophageal reflux disease)   . Heart murmur   . Leaky heart valve   . Migraine   . Migraine   . Trichomonas infection     Patient Active Problem List   Diagnosis Date Noted  . URI with cough and congestion 06/12/2020  . Suspected COVID-19 virus infection 06/12/2020  . Bronchitis with asthma, acute 06/12/2020  . Abnormal MRI, lumbar spine 04/19/2020  . Cervicalgia 04/19/2020  . Lumbar radiculopathy 04/19/2020  . Vitamin D deficiency 01/26/2020  . HLD (hyperlipidemia) 01/26/2020  . RLS (restless legs syndrome) 01/01/2020  . Mild intermittent asthma with acute exacerbation 01/01/2020  . Frontal sinusitis 01/01/2020  . Cigarette nicotine dependence without complication [id number]  . Lumbar herniated disc 06/24/2019  . Insomnia 06/24/2019  . Female pelvic pain 06/24/2019  . Diarrhea 03/05/2019  . Nausea and vomiting 03/05/2019  . COVID-19 virus detected 02/17/2019  . 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 Chronic sinusitis 04/04/2018  . Allergic rhinitis 04/04/2018  . Tobacco abuse 04/04/2018  . Migraine without status  migrainosus, not intractable 04/04/2018  . Cardiac murmur 04/04/2018  . Gastroesophageal reflux disease 04/04/2018  . Prediabetes 04/04/2018  . Fibroids 04/04/2018    Past Surgical History:  Procedure Laterality Date  . BREAST SURGERY     left breast abcess    OB History   No obstetric history on file.      Home Medications    Prior to Admission medications   Medication Sig Start Date End Date Taking? Authorizing Provider  acetaminophen (TYLENOL) 500 MG tablet Take 1 tablet (500 mg total) by mouth every 4 (four) hours as needed. 03/05/19   McLean-Scocuzza, Nino Glow, MD  albuterol (VENTOLIN HFA) 108 (90 Base) MCG/ACT inhaler Inhale 1-2 puffs into the lungs every 4 (four) hours as needed for wheezing or shortness of breath. 01/01/20   McLean-Scocuzza, Nino Glow, MD  atorvastatin (LIPITOR) 10 MG tablet Take 1 tablet (10 mg total) by mouth daily. At night 02/02/20   McLean-Scocuzza, Nino Glow, MD  Dextromethorphan-guaiFENesin 5-100 MG/5ML LIQD Take 10 mLs by mouth at bedtime as needed (for cough). 06/10/20   Marval Regal, NP  doxycycline (VIBRA-TABS) 100 MG tablet Take 1 tablet (100 mg total) by mouth 2 (two) times daily. 06/10/20   Marval Regal, NP  fluticasone (FLONASE) 50 MCG/ACT nasal spray Place 2 sprays into both nostrils daily. Prn max 2 sprays 04/04/18   McLean-Scocuzza, Nino Glow, MD  ipratropium-albuterol (DUONEB) 0.5-2.5 (3) MG/3ML SOLN Take 3 mLs by nebulization every 4 (four)  hours as needed. 06/23/20   Faustino Congress, NP  levofloxacin (LEVAQUIN) 500 MG tablet Take 1 tablet (500 mg total) by mouth daily. 06/23/20   Faustino Congress, NP  loratadine (CLARITIN) 10 MG tablet Take 1 tablet (10 mg total) by mouth daily as needed for allergies. 01/01/20   McLean-Scocuzza, Nino Glow, MD  meclizine (ANTIVERT) 25 MG tablet Take 1 tablet (25 mg total) by mouth 3 (three) times daily as needed for dizziness. 10/20/19   Norval Gable, MD  methocarbamol (ROBAXIN) 500 MG tablet Take 1  tablet (500 mg total) by mouth 2 (two) times daily as needed for muscle spasms. 04/09/20   McLean-Scocuzza, Nino Glow, MD  montelukast (SINGULAIR) 10 MG tablet Take 1 tablet (10 mg total) by mouth at bedtime. 01/01/20   McLean-Scocuzza, Nino Glow, MD  pantoprazole (PROTONIX) 40 MG tablet Take 1 tablet (40 mg total) by mouth daily. 30 min before 04/09/20   McLean-Scocuzza, Nino Glow, MD  predniSONE (DELTASONE) 10 MG tablet Take 6 tablets (60 mg total) on day 1; decrease by 1 tablet (10 mg total) each day until off. 06/10/20   Marval Regal, NP  traZODone (DESYREL) 50 MG tablet Take 0.5-1 tablets (25-50 mg total) by mouth at bedtime as needed for sleep. 01/01/20   McLean-Scocuzza, Nino Glow, MD    Family History Family History  Problem Relation Age of Onset  . Asthma Mother   . COPD Mother   . Diabetes Mother   . Miscarriages / [place] Mother   . Diabetes Father   . Heart disease Father   . Hypertension Father   . Heart failure Father   . Heart attack Father   . Asthma Daughter   . Miscarriages / [place] Daughter   . Arthritis Maternal Grandmother   . Asthma Maternal Grandmother   . Hyperlipidemia Maternal Grandmother   . Diabetes Maternal Grandfather   . Stroke Paternal Grandmother   . Heart disease Brother     Social History Social History   Tobacco Use  . Smoking status: Current Some Day Smoker    Packs/day: 0.50    Types: Cigarettes  . Smokeless tobacco: Never Used  . Tobacco comment: 1 ppd   Vaping Use  . Vaping Use: Never used  Substance Use Topics  . Alcohol use: Yes    Comment: occassionally  . Drug use: No     Allergies   Shellfish allergy   Review of Systems Review of Systems  Constitutional: Negative for activity change, appetite change and fever.  HENT: Positive for congestion and rhinorrhea. Negative for ear pain, sinus pressure and sore throat.   Respiratory: Negative for cough and shortness of breath.   Gastrointestinal: Positive for abdominal  pain. Negative for diarrhea, nausea and vomiting.  Musculoskeletal: Negative for arthralgias and myalgias.  Skin: Negative for rash.  Neurological: Negative for headaches.  Hematological: Negative.      Physical Exam Triage Vital Signs ED Triage Vitals  Enc Vitals Group     BP 08/03/20 1142 131/67     Pulse Rate 08/03/20 1142 62     Resp 08/03/20 1142 16     Temp 08/03/20 1142 97.9 F (36.6 C)     Temp Source 08/03/20 1142 Oral     SpO2 08/03/20 1142 98 %     Weight 08/03/20 1139 145 lb (65.8 kg)     Height --      Head Circumference --      Peak Flow --  Pain Score 08/03/20 1139 0     Pain Loc --      Pain Edu? --      Excl. in [place]? --    No data found.  Updated Vital Signs BP 131/67 (BP Location: Right Arm)   Pulse 62   Temp 97.9 F (36.6 C) (Oral)   Resp 16   Wt 145 lb (65.8 kg)   SpO2 98%   BMI 28.32 kg/m   Visual Acuity Right Eye Distance:   Left Eye Distance:   Bilateral Distance:    Right Eye Near:   Left Eye Near:    Bilateral Near:     Physical Exam Vitals and nursing note reviewed.  Constitutional:      General: She is not in acute distress.    Appearance: Normal appearance. She is normal weight. She is not toxic-appearing.  HENT:     Head: Normocephalic and atraumatic.     Right Ear: Tympanic membrane, ear canal and external ear normal.     Left Ear: Tympanic membrane, ear canal and external ear normal.     Nose: Nose normal. No congestion.     Mouth/Throat:     Mouth: Mucous membranes are moist.     Pharynx: Oropharynx is clear. No posterior oropharyngeal erythema.  Cardiovascular:     Rate and Rhythm: Normal rate and regular rhythm.     Pulses: Normal pulses.     Heart sounds: Normal heart sounds. No murmur heard. No gallop.   Pulmonary:     Effort: Pulmonary effort is normal.     Breath sounds: Normal breath sounds. No wheezing, rhonchi or rales.  Abdominal:     General: Abdomen is flat. Bowel sounds are normal. There is no  distension.     Palpations: Abdomen is soft.     Tenderness: There is no abdominal tenderness. There is no guarding or rebound.  Musculoskeletal:     Cervical back: Normal range of motion and neck supple.  Lymphadenopathy:     Cervical: No cervical adenopathy.  Skin:    General: Skin is warm and dry.     Capillary Refill: Capillary refill takes less than 2 seconds.     Findings: No erythema or rash.  Neurological:     General: No focal deficit present.     Mental Status: She is alert and oriented to person, place, and time.  Psychiatric:        Mood and Affect: Mood normal.        Behavior: Behavior normal.        Thought Content: Thought content normal.        Judgment: Judgment normal.      UC Treatments / Results  Labs (all labs ordered are listed, but only abnormal results are displayed) Labs Reviewed  SARS CORONAVIRUS 2 (TAT 6-24 HRS)    EKG   Radiology No results found.  Procedures Procedures (including critical care time)  Medications Ordered in UC Medications - No data to display  Initial Impression / Assessment and Plan / UC Course  I have reviewed the triage vital signs and the nursing notes.  Pertinent labs & imaging results that were available during my care of the patient were reviewed by me and considered in my medical decision making (see chart for details).   Patient is here for evaluation after COVID exposure.  She reports the following symptoms of a headache, runny nose, nasal congestion,, and lower abdominal pain.  Patient reports that her abdominal  pain has resolved and only occurred while at work and with walking.  Patient is nontoxic in appearance and in no acute distress.  Upper respiratory exam is benign, lungs are clear to auscultation all fields.  Due to COVID exposure will swab patient for COVID and have her isolate pending the results.  Patient has exceeded her quarantine.  As her symptoms started 1 week ago.   Final Clinical  Impressions(s) / UC Diagnoses   Final diagnoses:  Viral illness     Discharge Instructions     Isolate at home until the results of your COVID test are back.  If you test positive you have exceeded the quarantine timeframe as your symptoms have been going on longer than 5 days.  You will be free to go back to work when she gets a result as long as you mask while around others.  Use Tylenol and ibuprofen as needed for any pain.  If you develop any shortness of breath-especially at rest, and inability to speak full sentences, worsening abdominal pain, or fever ED go the ER for evaluation.    ED Prescriptions    None     PDMP not reviewed this encounter.   Margarette Canada, NP 08/03/20 1217

## 2020-08-04 LAB — SARS CORONAVIRUS 2 (TAT 6-24 HRS): SARS Coronavirus 2: NEGATIVE

## 2020-08-19 ENCOUNTER — Other Ambulatory Visit: Payer: Self-pay

## 2020-08-19 ENCOUNTER — Ambulatory Visit (INDEPENDENT_AMBULATORY_CARE_PROVIDER_SITE_OTHER): Payer: BC Managed Care – PPO | Admitting: Gastroenterology

## 2020-08-19 ENCOUNTER — Encounter: Payer: Self-pay | Admitting: Gastroenterology

## 2020-08-19 VITALS — BP 132/72 | HR 74 | Ht 61.0 in | Wt 147.6 lb

## 2020-08-19 DIAGNOSIS — Z1211 Encounter for screening for malignant neoplasm of colon: Secondary | ICD-10-CM | POA: Diagnosis not present

## 2020-08-19 DIAGNOSIS — R1310 Dysphagia, unspecified: Secondary | ICD-10-CM | POA: Diagnosis not present

## 2020-08-19 MED ORDER — OMEPRAZOLE 40 MG PO CPDR: 40.0000 mg | DELAYED_RELEASE_CAPSULE | Freq: Every day | ORAL | 3 refills | Status: DC

## 2020-08-19 MED ORDER — OMEPRAZOLE 40 MG PO CPDR: 40.0000 mg | DELAYED_RELEASE_CAPSULE | Freq: Every day | ORAL | 1 refills | Status: DC

## 2020-08-19 NOTE — Patient Instructions (Signed)
Gastroesophageal Reflux Disease, Adult  Gastroesophageal reflux (GER) happens when acid from the stomach flows up into the tube that connects the mouth and the stomach (esophagus). Normally, food travels down the esophagus and stays in the stomach to be digested. With GER, food and stomach acid sometimes move back up into the esophagus. You may have a disease called gastroesophageal reflux disease (GERD) if the reflux:  Happens often.  Causes frequent or very bad symptoms.  Causes problems such as damage to the esophagus. When this happens, the esophagus becomes sore and swollen. Over time, GERD can make small holes (ulcers) in the lining of the esophagus. What are the causes? This condition is caused by a problem with the muscle between the esophagus and the stomach. When this muscle is weak or not normal, it does not close properly to keep food and acid from coming back up from the stomach. The muscle can be weak because of:  Tobacco use.  Pregnancy.  Having a certain type of hernia (hiatal hernia).  Alcohol use.  Certain foods and drinks, such as coffee, chocolate, onions, and peppermint. What increases the risk?  Being overweight.  Having a disease that affects your connective tissue.  Taking NSAIDs, such a ibuprofen. What are the signs or symptoms?  Heartburn.  Difficult or painful swallowing.  The feeling of having a lump in the throat.  A bitter taste in the mouth.  Bad breath.  Having a lot of saliva.  Having an upset or bloated stomach.  Burping.  Chest pain. Different conditions can cause chest pain. Make sure you see your doctor if you have chest pain.  Shortness of breath or wheezing.  A long-term cough or a cough at night.  Wearing away of the surface of teeth (tooth enamel).  Weight loss. How is this treated?  Making changes to your diet.  Taking medicine.  Having surgery. Treatment will depend on how bad your symptoms are. Follow these  instructions at home: Eating and drinking  Follow a diet as told by your doctor. You may need to avoid foods and drinks such as: ? Coffee and tea, with or without caffeine. ? Drinks that contain alcohol. ? Energy drinks and sports drinks. ? Bubbly (carbonated) drinks or sodas. ? Chocolate and cocoa. ? Peppermint and mint flavorings. ? Garlic and onions. ? Horseradish. ? Spicy and acidic foods. These include peppers, chili powder, curry powder, vinegar, hot sauces, and BBQ sauce. ? Citrus fruit juices and citrus fruits, such as oranges, lemons, and limes. ? Tomato-based foods. These include red sauce, chili, salsa, and pizza with red sauce. ? Fried and fatty foods. These include donuts, french fries, potato chips, and high-fat dressings. ? High-fat meats. These include hot dogs, rib eye steak, sausage, ham, and bacon. ? High-fat dairy items, such as whole milk, butter, and cream cheese.  Eat small meals often. Avoid eating large meals.  Avoid drinking large amounts of liquid with your meals.  Avoid eating meals during the 2-3 hours before bedtime.  Avoid lying down right after you eat.  Do not exercise right after you eat.   Lifestyle  Do not smoke or use any products that contain nicotine or tobacco. If you need help quitting, ask your doctor.  Try to lower your stress. If you need help doing this, ask your doctor.  If you are overweight, lose an amount of weight that is healthy for you. Ask your doctor about a safe weight loss goal.   General instructions  Pay attention to any changes in your symptoms.  Take over-the-counter and prescription medicines only as told by your doctor.  Do not take aspirin, ibuprofen, or other NSAIDs unless your doctor says it is okay.  Wear loose clothes. Do not wear anything tight around your waist.  Raise (elevate) the head of your bed about 6 inches (15 cm). You may need to use a wedge to do this.  Avoid bending over if this makes your  symptoms worse.  Keep all follow-up visits. Contact a doctor if:  You have new symptoms.  You lose weight and you do not know why.  You have trouble swallowing or it hurts to swallow.  You have wheezing or a cough that keeps happening.  You have a hoarse voice.  Your symptoms do not get better with treatment. Get help right away if:  You have sudden pain in your arms, neck, jaw, teeth, or back.  You suddenly feel sweaty, dizzy, or light-headed.  You have chest pain or shortness of breath.  You vomit and the vomit is green, yellow, or black, or it looks like blood or coffee grounds.  You faint.  Your poop (stool) is red, bloody, or black.  You cannot swallow, drink, or eat. These symptoms may represent a serious problem that is an emergency. Do not wait to see if the symptoms will go away. Get medical help right away. Call your local emergency services (911 in the [place] Do not drive yourself to the hospital. Summary  If a person has gastroesophageal reflux disease (GERD), food and stomach acid move back up into the esophagus and cause symptoms or problems such as damage to the esophagus.  Treatment will depend on how bad your symptoms are.  Follow a diet as told by your doctor.  Take all medicines only as told by your doctor. This information is not intended to replace advice given to you by your health care provider. Make sure you discuss any questions you have with your health care provider. Document Revised: 01/19/2020 Document Reviewed: 01/19/2020 Elsevier Patient Education  [employer].

## 2020-08-19 NOTE — Progress Notes (Signed)
Jonathon Bellows MD, MRCP(U.K) [address]  [place]  [place],  [address]  Main: [phone]  Fax: [phone]   Gastroenterology Consultation  Referring Provider:     McLean-Scocuzza, Olivia Mackie * Primary Care Physician:  McLean-Scocuzza, Nino Glow, MD Primary Gastroenterologist:  Dr. Jonathon Bellows  Reason for Consultation:     Difficulty swallowing        HPI:   Teresa Deleon is a 55 y.o. y/o female referred for consultation & management  by Dr. Terese Door, Nino Glow, MD.     She was referred back in 04/12/2020 for difficulty swallowing.No recent labs no prior endoscopy records on epic available.  She states that for the past few years she has had difficulty swallowing liquids more than solids.  Sometimes get hung up in her chest.  Worse when she eats foods containing peanuts.  She suffers from asthma.  She also has heartburn.  She was prescribed pantoprazole but she did not receive it at the pharmacy.  No significant change in weight.  No prior endoscopy evaluation.  Never had a colonoscopy.  No family history of colon cancer or polyps.  She is smoker.  Past Medical History:  Diagnosis Date  . Asthma   . Back pain   . Chronic sinusitis   . GERD (gastroesophageal reflux disease)   . Heart murmur   . Leaky heart valve   . Migraine   . Migraine   . Trichomonas infection     Past Surgical History:  Procedure Laterality Date  . BREAST SURGERY     left breast abcess    Prior to Admission medications   Medication Sig Start Date End Date Taking? Authorizing Provider  acetaminophen (TYLENOL) 500 MG tablet Take 1 tablet (500 mg total) by mouth every 4 (four) hours as needed. 03/05/19   McLean-Scocuzza, Nino Glow, MD  albuterol (VENTOLIN HFA) 108 (90 Base) MCG/ACT inhaler Inhale 1-2 puffs into the lungs every 4 (four) hours as needed for wheezing or shortness of breath. 01/01/20   McLean-Scocuzza, Nino Glow, MD  atorvastatin (LIPITOR) 10 MG tablet Take 1 tablet (10 mg total)  by mouth daily. At night 02/02/20   McLean-Scocuzza, Nino Glow, MD  Dextromethorphan-guaiFENesin 5-100 MG/5ML LIQD Take 10 mLs by mouth at bedtime as needed (for cough). 06/10/20   Marval Regal, NP  doxycycline (VIBRA-TABS) 100 MG tablet Take 1 tablet (100 mg total) by mouth 2 (two) times daily. 06/10/20   Marval Regal, NP  fluticasone (FLONASE) 50 MCG/ACT nasal spray Place 2 sprays into both nostrils daily. Prn max 2 sprays 04/04/18   McLean-Scocuzza, Nino Glow, MD  ipratropium-albuterol (DUONEB) 0.5-2.5 (3) MG/3ML SOLN Take 3 mLs by nebulization every 4 (four) hours as needed. 06/23/20   Faustino Congress, NP  levofloxacin (LEVAQUIN) 500 MG tablet Take 1 tablet (500 mg total) by mouth daily. 06/23/20   Faustino Congress, NP  loratadine (CLARITIN) 10 MG tablet Take 1 tablet (10 mg total) by mouth daily as needed for allergies. 01/01/20   McLean-Scocuzza, Nino Glow, MD  meclizine (ANTIVERT) 25 MG tablet Take 1 tablet (25 mg total) by mouth 3 (three) times daily as needed for dizziness. 10/20/19   Norval Gable, MD  methocarbamol (ROBAXIN) 500 MG tablet Take 1 tablet (500 mg total) by mouth 2 (two) times daily as needed for muscle spasms. 04/09/20   McLean-Scocuzza, Nino Glow, MD  montelukast (SINGULAIR) 10 MG tablet Take 1 tablet (10 mg total) by mouth at bedtime. 01/01/20   McLean-Scocuzza, Olivia Mackie  N, MD  pantoprazole (PROTONIX) 40 MG tablet Take 1 tablet (40 mg total) by mouth daily. 30 min before 04/09/20   McLean-Scocuzza, Nino Glow, MD  predniSONE (DELTASONE) 10 MG tablet Take 6 tablets (60 mg total) on day 1; decrease by 1 tablet (10 mg total) each day until off. 06/10/20   Marval Regal, NP  traZODone (DESYREL) 50 MG tablet Take 0.5-1 tablets (25-50 mg total) by mouth at bedtime as needed for sleep. 01/01/20   McLean-Scocuzza, Nino Glow, MD    Family History  Problem Relation Age of Onset  . Asthma Mother   . COPD Mother   . Diabetes Mother   . Miscarriages / [place] Mother   . Diabetes  Father   . Heart disease Father   . Hypertension Father   . Heart failure Father   . Heart attack Father   . Asthma Daughter   . Miscarriages / [place] Daughter   . Arthritis Maternal Grandmother   . Asthma Maternal Grandmother   . Hyperlipidemia Maternal Grandmother   . Diabetes Maternal Grandfather   . Stroke Paternal Grandmother   . Heart disease Brother      Social History   Tobacco Use  . Smoking status: Current Some Day Smoker    Packs/day: 0.50    Types: Cigarettes  . Smokeless tobacco: Never Used  . Tobacco comment: 1 ppd   Vaping Use  . Vaping Use: Never used  Substance Use Topics  . Alcohol use: Yes    Comment: occassionally  . Drug use: No    Allergies as of 08/19/2020 - Review Complete 08/19/2020  Allergen Reaction Noted  . Shellfish allergy Itching 10/30/2017    Review of Systems:    All systems reviewed and negative except where noted in HPI.   Physical Exam:  BP 132/72 (BP Location: Left Arm, Patient Position: Sitting, Cuff Size: Normal)   Pulse 74   Ht 5\' 1"  (1.549 m)   Wt 147 lb 9.6 oz (67 kg)   BMI 27.89 kg/m  No LMP recorded. Patient is postmenopausal. Psych:  Alert and cooperative. Normal mood and affect. General:   Alert,  Well-developed, well-nourished, pleasant and cooperative in NAD Head:  Normocephalic and atraumatic. Abdomen:  Normal bowel sounds.  No bruits.  Soft, non-tender and non-distended without masses, hepatosplenomegaly or hernias noted.  No guarding or rebound tenderness.    Neurologic:  Alert and oriented x3;  grossly normal neurologically. Psych:  Alert and cooperative. Normal mood and affect.  Imaging Studies: No results found.  Assessment and Plan:   Teresa Deleon is a 55 y.o. y/o female has been referred for difficulty swallowing.  Her history and symptoms are very suggestive of GERD.  Not on any medications presently.  Dysphagia probably related to same.  It is also possible she has eosinophilic esophagitis as  she develops symptoms when she consumes foods containing peanuts and she also suffers from asthma.  Never had a colonoscopy.  Average risk for colon cancer screening.  Plan 1.  Prilosec 40 mg 30 minutes before breakfast to commence today and continue till next office visit  2.  EGD to evaluate for dysphagia and colonoscopy for colon cancer screening average risk.  Will rule out eosinophilic esophagitis at the same time.  Rule out strictures.  3.  GERD: Discussed lifestyle changes including timing of her dinner at least 2 hours before bedtime.  Small meals more often than large meals at one time.  Taking her PPI daily.  Using a wedge pillow.  Picture of a wedge pillow provided.  Patient information on acid reflux provided.  At next visit if doing better will consider decreasing dose of PPI.   I have discussed alternative options, risks & benefits,  which include, but are not limited to, bleeding, infection, perforation,respiratory complication & drug reaction.  The patient agrees with this plan & written consent will be obtained.     Follow up in 6 to 8-week  Dr Jonathon Bellows MD,MRCP(U.K)

## 2020-08-30 ENCOUNTER — Ambulatory Visit
Admission: EM | Admit: 2020-08-30 | Discharge: 2020-08-30 | Disposition: A | Discharge status: Home / Self Care | Payer: Self-pay | Attending: Emergency Medicine | Admitting: Emergency Medicine

## 2020-08-30 ENCOUNTER — Other Ambulatory Visit: Payer: Self-pay

## 2020-08-30 DIAGNOSIS — M5441 Lumbago with sciatica, right side: Secondary | ICD-10-CM

## 2020-08-30 DIAGNOSIS — M5442 Lumbago with sciatica, left side: Secondary | ICD-10-CM

## 2020-08-30 DIAGNOSIS — W19XXXA Unspecified fall, initial encounter: Secondary | ICD-10-CM

## 2020-08-30 MED ORDER — PREDNISONE 10 MG (21) PO TBPK: ORAL_TABLET | Freq: Every day | ORAL | 0 refills | Status: DC

## 2020-08-30 NOTE — ED Provider Notes (Signed)
[facility]    CSN: [id number] Arrival date & time: 08/30/20  1158      History   Chief Complaint Chief Complaint  Patient presents with  . Fall    HPI Teresa Deleon is a 55 y.o. female.   HPI   55 year old female here for evaluation of low back pain and pain in both hips after falling this morning.  Patient reports that she slipped on the ice landing on her bottom and jarred her back and hips pretty well.  Patient denies any numbness, tingling, or weakness.  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  She reports that her pain feels exactly the same as when that exacerbates.  Patient is requesting prednisone.  Past Medical History:  Diagnosis Date  . Asthma   . Back pain   . Chronic sinusitis   . GERD (gastroesophageal reflux disease)   . Heart murmur   . Leaky heart valve   . Migraine   . Migraine   . Trichomonas infection     Patient Active Problem List   Diagnosis Date Noted  . Dysphagia 08/19/2020  . Colon cancer screening 08/19/2020  . URI with cough and congestion 06/12/2020  . Suspected COVID-19 virus infection 06/12/2020  . Bronchitis with asthma, acute 06/12/2020  . Abnormal MRI, lumbar spine 04/19/2020  . Cervicalgia 04/19/2020  . Lumbar radiculopathy 04/19/2020  . Vitamin D deficiency 01/26/2020  . HLD (hyperlipidemia) 01/26/2020  . RLS (restless legs syndrome) 01/01/2020  . Mild intermittent asthma with acute exacerbation 01/01/2020  . Frontal sinusitis 01/01/2020  . Cigarette nicotine dependence without complication [id number]  . Lumbar herniated disc 06/24/2019  . Insomnia 06/24/2019  . Female pelvic pain 06/24/2019  . Diarrhea 03/05/2019  . Nausea and vomiting 03/05/2019  . COVID-19 virus detected 02/17/2019  . 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 Chronic sinusitis 04/04/2018  . Allergic rhinitis 04/04/2018  . Tobacco abuse 04/04/2018  . Migraine  without status migrainosus, not intractable 04/04/2018  . Cardiac murmur 04/04/2018  . Gastroesophageal reflux disease 04/04/2018  . Prediabetes 04/04/2018  . Fibroids 04/04/2018    Past Surgical History:  Procedure Laterality Date  . BREAST SURGERY     left breast abcess    OB History   No obstetric history on file.      Home Medications    Prior to Admission medications   Medication Sig Start Date End Date Taking? Authorizing Provider  acetaminophen (TYLENOL) 500 MG tablet Take 1 tablet (500 mg total) by mouth every 4 (four) hours as needed. 03/05/19  Yes McLean-Scocuzza, Nino Glow, MD  albuterol (VENTOLIN HFA) 108 (90 Base) MCG/ACT inhaler Inhale 1-2 puffs into the lungs every 4 (four) hours as needed for wheezing or shortness of breath. 01/01/20  Yes McLean-Scocuzza, Nino Glow, MD  atorvastatin (LIPITOR) 10 MG tablet Take 1 tablet (10 mg total) by mouth daily. At night 02/02/20  Yes McLean-Scocuzza, Nino Glow, MD  fluticasone (FLONASE) 50 MCG/ACT nasal spray Place 2 sprays into both nostrils daily. Prn max 2 sprays 04/04/18  Yes McLean-Scocuzza, Nino Glow, MD  ipratropium-albuterol (DUONEB) 0.5-2.5 (3) MG/3ML SOLN Take 3 mLs by nebulization every 4 (four) hours as needed. 06/23/20  Yes Faustino Congress, NP  loratadine (CLARITIN) 10 MG tablet Take 1 tablet (10 mg total) by mouth daily as needed for allergies. 01/01/20  Yes McLean-Scocuzza, Nino Glow, MD  meclizine (ANTIVERT) 25 MG tablet Take 1 tablet (25  mg total) by mouth 3 (three) times daily as needed for dizziness. 10/20/19  Yes Norval Gable, MD  methocarbamol (ROBAXIN) 500 MG tablet Take 1 tablet (500 mg total) by mouth 2 (two) times daily as needed for muscle spasms. 04/09/20  Yes McLean-Scocuzza, Nino Glow, MD  montelukast (SINGULAIR) 10 MG tablet Take 1 tablet (10 mg total) by mouth at bedtime. 01/01/20  Yes McLean-Scocuzza, Nino Glow, MD  omeprazole (PRILOSEC) 40 MG capsule Take 1 capsule (40 mg total) by mouth daily. 08/19/20  Yes Jonathon Bellows, MD  predniSONE (STERAPRED UNI-PAK 21 TAB) 10 MG (21) TBPK tablet Take by mouth daily. Take 6 tabs by mouth daily  for 2 days, then 5 tabs for 2 days, then 4 tabs for 2 days, then 3 tabs for 2 days, 2 tabs for 2 days, then 1 tab by mouth daily for 2 days 08/30/20  Yes Margarette Canada, NP  traZODone (DESYREL) 50 MG tablet Take 0.5-1 tablets (25-50 mg total) by mouth at bedtime as needed for sleep. 01/01/20  Yes McLean-Scocuzza, Nino Glow, MD  pantoprazole (PROTONIX) 40 MG tablet Take 1 tablet (40 mg total) by mouth daily. 30 min before 04/09/20 08/30/20  McLean-Scocuzza, Nino Glow, MD    Family History Family History  Problem Relation Age of Onset  . Asthma Mother   . COPD Mother   . Diabetes Mother   . Miscarriages / [place] Mother   . Diabetes Father   . Heart disease Father   . Hypertension Father   . Heart failure Father   . Heart attack Father   . Asthma Daughter   . Miscarriages / [place] Daughter   . Arthritis Maternal Grandmother   . Asthma Maternal Grandmother   . Hyperlipidemia Maternal Grandmother   . Diabetes Maternal Grandfather   . Stroke Paternal Grandmother   . Heart disease Brother     Social History Social History   Tobacco Use  . Smoking status: Current Some Day Smoker    Packs/day: 0.50    Types: Cigarettes  . Smokeless tobacco: Never Used  . Tobacco comment: 1 ppd   Vaping Use  . Vaping Use: Never used  Substance Use Topics  . Alcohol use: Yes    Comment: occassionally  . Drug use: No     Allergies   Shellfish allergy   Review of Systems Review of Systems  Constitutional: Negative for activity change.  Musculoskeletal: Positive for back pain. Negative for gait problem.  Neurological: Negative for weakness.  Hematological: Negative.   Psychiatric/Behavioral: Negative.      Physical Exam Triage Vital Signs ED Triage Vitals  Enc Vitals Group     BP 08/30/20 1259 139/70     Pulse Rate 08/30/20 1259 70     Resp 08/30/20 1259 18      Temp 08/30/20 1259 98.2 F (36.8 C)     Temp Source 08/30/20 1259 Oral     SpO2 08/30/20 1259 99 %     Weight 08/30/20 1255 147 lb (66.7 kg)     Height 08/30/20 1255 5\' 1"  (1.549 m)     Head Circumference --      Peak Flow --      Pain Score 08/30/20 1255 10     Pain Loc --      Pain Edu? --      Excl. in [place]? --    No data found.  Updated Vital Signs BP 139/70 (BP Location: Right Arm)   Pulse 70   Temp  98.2 F (36.8 C) (Oral)   Resp 18   Ht 5\' 1"  (1.549 m)   Wt 147 lb (66.7 kg)   SpO2 99%   BMI 27.78 kg/m   Visual Acuity Right Eye Distance:   Left Eye Distance:   Bilateral Distance:    Right Eye Near:   Left Eye Near:    Bilateral Near:     Physical Exam Vitals and nursing note reviewed.  Constitutional:      General: She is not in acute distress.    Appearance: Normal appearance. She is normal weight.  HENT:     Head: Normocephalic and atraumatic.  Cardiovascular:     Rate and Rhythm: Normal rate and regular rhythm.     Pulses: Normal pulses.     Heart sounds: Normal heart sounds. No murmur heard. No gallop.   Pulmonary:     Effort: Pulmonary effort is normal.     Breath sounds: Normal breath sounds. No wheezing or rhonchi.  Musculoskeletal:        General: Tenderness present. No swelling or deformity.  Skin:    General: Skin is warm and dry.     Capillary Refill: Capillary refill takes less than 2 seconds.     Findings: No bruising.  Neurological:     General: No focal deficit present.     Mental Status: She is alert.  Psychiatric:        Mood and Affect: Mood normal.        Behavior: Behavior normal.        Thought Content: Thought content normal.        Judgment: Judgment normal.      UC Treatments / Results  Labs (all labs ordered are listed, but only abnormal results are displayed) Labs Reviewed - No data to display  EKG   Radiology No results found.  Procedures Procedures (including critical care time)  Medications Ordered in  UC Medications - No data to display  Initial Impression / Assessment and Plan / UC Course  I have reviewed the triage vital signs and the nursing notes.  Pertinent labs & imaging results that were available during my care of the patient were reviewed by me and considered in my medical decision making (see chart for details).   Patient is here for evaluation of low back pain that radiates to both of her hips that started this morning after she fell on the ice.  Patient denies numbness, tingling, or weakness.  Patient has full range of motion and sensation in her lower extremities.  Patient does have tenderness to palpation of her midline lumbar spine and bilateral paraspinous regions.  Patient states that she is using nonsedating muscle relaxers and is requesting prednisone.  We will treat with prednisone Dosepak, have her continue her muscle lectures, and follow-up with her PCP for any new or worsening symptoms.   Final Clinical Impressions(s) / UC Diagnoses   Final diagnoses:  Midline low back pain with bilateral sciatica, unspecified chronicity  Fall, initial encounter     Discharge Instructions     Continue your muscle lectures that you have been prescribed previously.  Take the prednisone according to the package instructions.  If your pain worsens, or new symptoms develop, return for reevaluation or see your primary care provider.    ED Prescriptions    Medication Sig Dispense Auth. Provider   predniSONE (STERAPRED UNI-PAK 21 TAB) 10 MG (21) TBPK tablet Take by mouth daily. Take 6 tabs by mouth daily  for 2 days, then 5 tabs for 2 days, then 4 tabs for 2 days, then 3 tabs for 2 days, 2 tabs for 2 days, then 1 tab by mouth daily for 2 days 42 tablet Margarette Canada, NP     PDMP not reviewed this encounter.   Margarette Canada, NP 08/30/20 1321

## 2020-08-30 NOTE — Discharge Instructions (Signed)
Continue your muscle lectures that you have been prescribed previously.  Take the prednisone according to the package instructions.  If your pain worsens, or new symptoms develop, return for reevaluation or see your primary care provider.

## 2020-08-30 NOTE — ED Triage Notes (Signed)
Patient states that she fell down her steps this morning outside do to ice. Patient states that she hit two steps. Patient complains of low back pain and bilateral hip pain.

## 2020-09-13 ENCOUNTER — Ambulatory Visit
Admission: EM | Admit: 2020-09-13 | Discharge: 2020-09-13 | Disposition: A | Discharge status: Home / Self Care | Payer: Self-pay | Attending: Family Medicine | Admitting: Family Medicine

## 2020-09-13 ENCOUNTER — Other Ambulatory Visit: Payer: Self-pay

## 2020-09-13 DIAGNOSIS — R21 Rash and other nonspecific skin eruption: Secondary | ICD-10-CM

## 2020-09-13 MED ORDER — HYDROXYZINE HCL 25 MG PO TABS: 25.0000 mg | ORAL_TABLET | Freq: Three times a day (TID) | ORAL | 0 refills | Status: DC | PRN

## 2020-09-13 MED ORDER — TRIAMCINOLONE ACETONIDE 0.5 % EX OINT: 1.0000 "application " | TOPICAL_OINTMENT | Freq: Two times a day (BID) | CUTANEOUS | 0 refills | Status: DC

## 2020-09-13 NOTE — ED Provider Notes (Signed)
[facility]    CSN: [id number] Arrival date & time: 09/13/20  1222      History   Chief Complaint Chief Complaint  Patient presents with  . Urticaria   HPI  55 year old female presents with rash.  Started yesterday.  Patient states that she recently ate some shellfish and it had some lime juice on it.  She is allergic to both of these items.  She states that she has a rash to her left knee.  It is intensely pruritic.  No other rashes.  No difficulty breathing.  No lip swelling or tongue swelling.  She has applied hydrocortisone without relief.  No other associated symptoms.  No other complaints.  Past Medical History:  Diagnosis Date  . Asthma   . Back pain   . Chronic sinusitis   . GERD (gastroesophageal reflux disease)   . Heart murmur   . Leaky heart valve   . Migraine   . Migraine   . Trichomonas infection     Patient Active Problem List   Diagnosis Date Noted  . Dysphagia 08/19/2020  . Colon cancer screening 08/19/2020  . URI with cough and congestion 06/12/2020  . Suspected COVID-19 virus infection 06/12/2020  . Bronchitis with asthma, acute 06/12/2020  . Abnormal MRI, lumbar spine 04/19/2020  . Cervicalgia 04/19/2020  . Lumbar radiculopathy 04/19/2020  . Vitamin D deficiency 01/26/2020  . HLD (hyperlipidemia) 01/26/2020  . RLS (restless legs syndrome) 01/01/2020  . Mild intermittent asthma with acute exacerbation 01/01/2020  . Frontal sinusitis 01/01/2020  . Cigarette nicotine dependence without complication 33/00/7622  . Lumbar herniated disc 06/24/2019  . Insomnia 06/24/2019  . Female pelvic pain 06/24/2019  . Diarrhea 03/05/2019  . Nausea and vomiting 03/05/2019  . COVID-19 virus detected 02/17/2019  . 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 Chronic sinusitis 04/04/2018  . Allergic rhinitis 04/04/2018  . Tobacco abuse 04/04/2018  . Migraine without status migrainosus, not intractable 04/04/2018  . Cardiac  murmur 04/04/2018  . Gastroesophageal reflux disease 04/04/2018  . Prediabetes 04/04/2018  . Fibroids 04/04/2018    Past Surgical History:  Procedure Laterality Date  . BREAST SURGERY     left breast abcess    OB History   No obstetric history on file.      Home Medications    Prior to Admission medications   Medication Sig Start Date End Date Taking? Authorizing Provider  hydrOXYzine (ATARAX/VISTARIL) 25 MG tablet Take 1 tablet (25 mg total) by mouth every 8 (eight) hours as needed for itching. 09/13/20  Yes Thersa Salt G, DO  triamcinolone ointment (KENALOG) 0.5 % Apply 1 application topically 2 (two) times daily. 09/13/20  Yes Shonita Rinck G, DO  acetaminophen (TYLENOL) 500 MG tablet Take 1 tablet (500 mg total) by mouth every 4 (four) hours as needed. 03/05/19   McLean-Scocuzza, Nino Glow, MD  albuterol (VENTOLIN HFA) 108 (90 Base) MCG/ACT inhaler Inhale 1-2 puffs into the lungs every 4 (four) hours as needed for wheezing or shortness of breath. 01/01/20   McLean-Scocuzza, Nino Glow, MD  atorvastatin (LIPITOR) 10 MG tablet Take 1 tablet (10 mg total) by mouth daily. At night 02/02/20   McLean-Scocuzza, Nino Glow, MD  fluticasone (FLONASE) 50 MCG/ACT nasal spray Place 2 sprays into both nostrils daily. Prn max 2 sprays 04/04/18   McLean-Scocuzza, Nino Glow, MD  ipratropium-albuterol (DUONEB) 0.5-2.5 (3) MG/3ML SOLN Take 3 mLs by nebulization every 4 (four) hours as needed. 06/23/20   Faustino Congress,  NP  loratadine (CLARITIN) 10 MG tablet Take 1 tablet (10 mg total) by mouth daily as needed for allergies. 01/01/20   McLean-Scocuzza, Nino Glow, MD  meclizine (ANTIVERT) 25 MG tablet Take 1 tablet (25 mg total) by mouth 3 (three) times daily as needed for dizziness. 10/20/19   Norval Gable, MD  montelukast (SINGULAIR) 10 MG tablet Take 1 tablet (10 mg total) by mouth at bedtime. 01/01/20   McLean-Scocuzza, Nino Glow, MD  omeprazole (PRILOSEC) 40 MG capsule Take 1 capsule (40 mg total) by mouth daily.  08/19/20   Jonathon Bellows, MD  traZODone (DESYREL) 50 MG tablet Take 0.5-1 tablets (25-50 mg total) by mouth at bedtime as needed for sleep. 01/01/20   McLean-Scocuzza, Nino Glow, MD  pantoprazole (PROTONIX) 40 MG tablet Take 1 tablet (40 mg total) by mouth daily. 30 min before 04/09/20 08/30/20  McLean-Scocuzza, Nino Glow, MD    Family History Family History  Problem Relation Age of Onset  . Asthma Mother   . COPD Mother   . Diabetes Mother   . Miscarriages / [place] Mother   . Diabetes Father   . Heart disease Father   . Hypertension Father   . Heart failure Father   . Heart attack Father   . Asthma Daughter   . Miscarriages / [place] Daughter   . Arthritis Maternal Grandmother   . Asthma Maternal Grandmother   . Hyperlipidemia Maternal Grandmother   . Diabetes Maternal Grandfather   . Stroke Paternal Grandmother   . Heart disease Brother     Social History Social History   Tobacco Use  . Smoking status: Current Some Day Smoker    Packs/day: 0.50    Types: Cigarettes  . Smokeless tobacco: Never Used  . Tobacco comment: 1 ppd   Vaping Use  . Vaping Use: Never used  Substance Use Topics  . Alcohol use: Yes    Comment: occassionally  . Drug use: No     Allergies   Shellfish allergy, Strawberry (diagnostic), and Tomato   Review of Systems Review of Systems  Constitutional: Negative.   Skin: Positive for rash.   Physical Exam Triage Vital Signs ED Triage Vitals  Enc Vitals Group     BP 09/13/20 1255 138/81     Pulse Rate 09/13/20 1255 73     Resp 09/13/20 1255 16     Temp 09/13/20 1256 98.3 F (36.8 C)     Temp Source 09/13/20 1255 Oral     SpO2 09/13/20 1255 100 %     Weight --      Height --      Head Circumference --      Peak Flow --      Pain Score 09/13/20 1251 0     Pain Loc --      Pain Edu? --      Excl. in [place]? --    Updated Vital Signs BP 138/81 (BP Location: Left Arm)   Pulse 73   Temp 98.3 F (36.8 C) (Oral)   Resp 16   SpO2 100%    Visual Acuity Right Eye Distance:   Left Eye Distance:   Bilateral Distance:    Right Eye Near:   Left Eye Near:    Bilateral Near:     Physical Exam Constitutional:      General: She is not in acute distress.    Appearance: Normal appearance. She is not ill-appearing.  HENT:     Head: Normocephalic and atraumatic.  Eyes:  General:        Right eye: No discharge.        Left eye: No discharge.     Conjunctiva/sclera: Conjunctivae normal.  Pulmonary:     Effort: Pulmonary effort is normal. No respiratory distress.  Skin:    Comments: Slightly raised erythematous rash noted around the left knee.  Neurological:     Mental Status: She is alert.  Psychiatric:        Mood and Affect: Mood normal.        Behavior: Behavior normal.    UC Treatments / Results  Labs (all labs ordered are listed, but only abnormal results are displayed) Labs Reviewed - No data to display  EKG   Radiology No results found.  Procedures Procedures (including critical care time)  Medications Ordered in UC Medications - No data to display  Initial Impression / Assessment and Plan / UC Course  I have reviewed the triage vital signs and the nursing notes.  Pertinent labs & imaging results that were available during my care of the patient were reviewed by me and considered in my medical decision making (see chart for details).    55 year old female presents with rash.  Rash is localized to the left knee.  It is very itchy.  Treating with topical triamcinolone and Atarax.  Final Clinical Impressions(s) / UC Diagnoses   Final diagnoses:  Rash   Discharge Instructions   None    ED Prescriptions    Medication Sig Dispense Auth. Provider   triamcinolone ointment (KENALOG) 0.5 % Apply 1 application topically 2 (two) times daily. 30 g Taylen Osorto G, DO   hydrOXYzine (ATARAX/VISTARIL) 25 MG tablet Take 1 tablet (25 mg total) by mouth every 8 (eight) hours as needed for itching. 30  tablet Coral Spikes, DO     PDMP not reviewed this encounter.   Coral Spikes, [place] 09/13/20 1456

## 2020-09-13 NOTE — ED Triage Notes (Signed)
Pt is present today with a rash on her left knee. Pt states that she noticed it yesterday. Pt states that she ate shellfish and orange juice, pt states that she is allergic to both to  these items.

## 2020-09-15 ENCOUNTER — Other Ambulatory Visit: Payer: Self-pay

## 2020-09-15 ENCOUNTER — Other Ambulatory Visit
Admission: RE | Admit: 2020-09-15 | Discharge: 2020-09-15 | Disposition: A | Discharge status: Home / Self Care | Payer: HRSA Program | Source: Ambulatory Visit | Attending: Gastroenterology | Admitting: Gastroenterology

## 2020-09-15 DIAGNOSIS — Z01812 Encounter for preprocedural laboratory examination: Secondary | ICD-10-CM | POA: Insufficient documentation

## 2020-09-15 DIAGNOSIS — Z20822 Contact with and (suspected) exposure to covid-19: Secondary | ICD-10-CM | POA: Insufficient documentation

## 2020-09-15 LAB — SARS CORONAVIRUS 2 (TAT 6-24 HRS): SARS Coronavirus 2: NEGATIVE

## 2020-09-17 ENCOUNTER — Encounter
Admission: RE | Disposition: A | Discharge status: Home / Self Care | Payer: Self-pay | Source: Home / Self Care | Attending: Gastroenterology

## 2020-09-17 ENCOUNTER — Ambulatory Visit: Payer: Self-pay | Admitting: Anesthesiology

## 2020-09-17 ENCOUNTER — Encounter: Payer: Self-pay | Admitting: Gastroenterology

## 2020-09-17 ENCOUNTER — Ambulatory Visit
Admission: RE | Admit: 2020-09-17 | Discharge: 2020-09-17 | Disposition: A | Discharge status: Home / Self Care | Payer: Self-pay | Attending: Gastroenterology | Admitting: Gastroenterology

## 2020-09-17 ENCOUNTER — Other Ambulatory Visit: Payer: Self-pay

## 2020-09-17 DIAGNOSIS — Z833 Family history of diabetes mellitus: Secondary | ICD-10-CM | POA: Insufficient documentation

## 2020-09-17 DIAGNOSIS — Z1211 Encounter for screening for malignant neoplasm of colon: Secondary | ICD-10-CM | POA: Insufficient documentation

## 2020-09-17 DIAGNOSIS — K222 Esophageal obstruction: Secondary | ICD-10-CM | POA: Insufficient documentation

## 2020-09-17 DIAGNOSIS — Z8349 Family history of other endocrine, nutritional and metabolic diseases: Secondary | ICD-10-CM | POA: Insufficient documentation

## 2020-09-17 DIAGNOSIS — Z8261 Family history of arthritis: Secondary | ICD-10-CM | POA: Insufficient documentation

## 2020-09-17 DIAGNOSIS — Z79899 Other long term (current) drug therapy: Secondary | ICD-10-CM | POA: Insufficient documentation

## 2020-09-17 DIAGNOSIS — F17210 Nicotine dependence, cigarettes, uncomplicated: Secondary | ICD-10-CM | POA: Insufficient documentation

## 2020-09-17 DIAGNOSIS — R1310 Dysphagia, unspecified: Secondary | ICD-10-CM

## 2020-09-17 DIAGNOSIS — Z8249 Family history of ischemic heart disease and other diseases of the circulatory system: Secondary | ICD-10-CM | POA: Insufficient documentation

## 2020-09-17 DIAGNOSIS — Z825 Family history of asthma and other chronic lower respiratory diseases: Secondary | ICD-10-CM | POA: Insufficient documentation

## 2020-09-17 HISTORY — PX: ESOPHAGOGASTRODUODENOSCOPY (EGD) WITH PROPOFOL: SHX5813

## 2020-09-17 HISTORY — PX: COLONOSCOPY WITH PROPOFOL: SHX5780

## 2020-09-17 SURGERY — COLONOSCOPY WITH PROPOFOL
Anesthesia: General

## 2020-09-17 MED ORDER — ONDANSETRON HCL 4 MG/2ML IJ SOLN
INTRAMUSCULAR | Status: AC
  Filled 2020-09-17: qty 2

## 2020-09-17 MED ORDER — PROPOFOL 500 MG/50ML IV EMUL
INTRAVENOUS | Status: AC
  Filled 2020-09-17: qty 50

## 2020-09-17 MED ORDER — DEXMEDETOMIDINE (PRECEDEX) IN NS 20 MCG/5ML (4 MCG/ML) IV SYRINGE
PREFILLED_SYRINGE | INTRAVENOUS | Status: AC
  Filled 2020-09-17: qty 5

## 2020-09-17 MED ORDER — DEXMEDETOMIDINE (PRECEDEX) IN NS 20 MCG/5ML (4 MCG/ML) IV SYRINGE
PREFILLED_SYRINGE | INTRAVENOUS | Status: DC | PRN
  Administered 2020-09-17: 20 ug via INTRAVENOUS

## 2020-09-17 MED ORDER — SODIUM CHLORIDE 0.9 % IV SOLN: INTRAVENOUS | Status: DC

## 2020-09-17 MED ORDER — LIDOCAINE HCL (CARDIAC) PF 100 MG/5ML IV SOSY
PREFILLED_SYRINGE | INTRAVENOUS | Status: DC | PRN
  Administered 2020-09-17 (×2): 40 mg via INTRAVENOUS

## 2020-09-17 MED ORDER — PROPOFOL 500 MG/50ML IV EMUL
INTRAVENOUS | Status: DC | PRN
  Administered 2020-09-17: 175 ug/kg/min via INTRAVENOUS

## 2020-09-17 MED ORDER — PROPOFOL 10 MG/ML IV BOLUS
INTRAVENOUS | Status: DC | PRN
  Administered 2020-09-17: 10 mg via INTRAVENOUS
  Administered 2020-09-17: 80 mg via INTRAVENOUS
  Administered 2020-09-17: 10 mg via INTRAVENOUS

## 2020-09-17 MED ORDER — ONDANSETRON HCL 4 MG/2ML IJ SOLN
INTRAMUSCULAR | Status: DC | PRN
  Administered 2020-09-17: 4 mg via INTRAVENOUS

## 2020-09-17 MED ORDER — GLYCOPYRROLATE 0.2 MG/ML IJ SOLN
INTRAMUSCULAR | Status: DC | PRN
  Administered 2020-09-17: .2 mg via INTRAVENOUS

## 2020-09-17 NOTE — Op Note (Signed)
[facility] Gastroenterology Patient Name: Teresa Deleon Procedure Date: 09/17/2020 9:21 AM MRN: [id number] Account #: [id number] Date of Birth: 1966/04/06 Admit Type: Outpatient Age: 55 Room: [facility] ENDO ROOM 4 Gender: Female Note Status: Finalized Procedure:             Colonoscopy Indications:           Screening for colorectal malignant neoplasm Providers:             Jonathon Bellows MD, MD Referring MD:          Nino Glow Mclean-Scocuzza MD, MD (Referring MD) Medicines:             Monitored Anesthesia Care Complications:         No immediate complications. Procedure:             Pre-Anesthesia Assessment:                        - Prior to the procedure, a History and Physical was                         performed, and patient medications, allergies and                         sensitivities were reviewed. The patient's tolerance                         of previous anesthesia was reviewed.                        - The risks and benefits of the procedure and the                         sedation options and risks were discussed with the                         patient. All questions were answered and informed                         consent was obtained.                        - ASA Grade Assessment: II - A patient with mild                         systemic disease.                        After obtaining informed consent, the colonoscope was                         passed under direct vision. Throughout the procedure,                         the patient's blood pressure, pulse, and oxygen                         saturations were monitored continuously. The                         Colonoscope was introduced through the  anus and                         advanced to the the cecum, identified by the                         appendiceal orifice. The colonoscopy was performed                         with ease. The patient tolerated the procedure well.                          The quality of the bowel preparation was good. Findings:      The perianal and digital rectal examinations were normal.      The entire examined colon appeared normal on direct and retroflexion       views. Impression:            - The entire examined colon is normal on direct and                         retroflexion views.                        - No specimens collected. Recommendation:        - Discharge patient to home (with escort).                        - Resume previous diet.                        - Continue present medications.                        - Repeat colonoscopy in 10 years for screening                         purposes. Procedure Code(s):     --- Professional ---                        [phone], Colonoscopy, flexible; diagnostic, including                         collection of specimen(s) by brushing or washing, when                         performed (separate procedure) Diagnosis Code(s):     --- Professional ---                        Z12.11, Encounter for screening for malignant neoplasm                         of colon CPT copyright 2019 American Medical Association. All rights reserved. The codes documented in this report are preliminary and upon coder review may  be revised to meet current compliance requirements. Jonathon Bellows, MD Jonathon Bellows MD, MD 09/17/2020 10:01:05 AM This report has been signed electronically. Number of Addenda: 0 Note Initiated On: 09/17/2020 9:21 AM Scope Withdrawal Time: 0 hours 9 minutes 23 seconds  Total Procedure Duration: 0 hours 13 minutes 18  seconds  Estimated Blood Loss:  Estimated blood loss: none.      [facility]

## 2020-09-17 NOTE — H&P (Signed)
Jonathon Bellows, MD [address], [place], [place], [place], [address] [address], [place], [place], [place], [address] Phone: [phone]  Fax: [phone]  Primary Care Physician:  McLean-Scocuzza, Nino Glow, MD   Pre-Procedure History & Physical: HPI:  Teresa Deleon is a 54 y.o. female is here for an endoscopy and colonoscopy    Past Medical History:  Diagnosis Date  . Asthma   . Back pain   . Chronic sinusitis   . GERD (gastroesophageal reflux disease)   . Heart murmur   . Leaky heart valve   . Migraine   . Migraine   . Trichomonas infection     Past Surgical History:  Procedure Laterality Date  . BREAST SURGERY     left breast abcess    Prior to Admission medications   Medication Sig Start Date End Date Taking? Authorizing Provider  acetaminophen (TYLENOL) 500 MG tablet Take 1 tablet (500 mg total) by mouth every 4 (four) hours as needed. 03/05/19  Yes McLean-Scocuzza, Nino Glow, MD  albuterol (VENTOLIN HFA) 108 (90 Base) MCG/ACT inhaler Inhale 1-2 puffs into the lungs every 4 (four) hours as needed for wheezing or shortness of breath. 01/01/20  Yes McLean-Scocuzza, Nino Glow, MD  atorvastatin (LIPITOR) 10 MG tablet Take 1 tablet (10 mg total) by mouth daily. At night 02/02/20  Yes McLean-Scocuzza, Nino Glow, MD  fluticasone (FLONASE) 50 MCG/ACT nasal spray Place 2 sprays into both nostrils daily. Prn max 2 sprays 04/04/18  Yes McLean-Scocuzza, Nino Glow, MD  hydrOXYzine (ATARAX/VISTARIL) 25 MG tablet Take 1 tablet (25 mg total) by mouth every 8 (eight) hours as needed for itching. 09/13/20  Yes Cook, Jayce G, DO  ipratropium-albuterol (DUONEB) 0.5-2.5 (3) MG/3ML SOLN Take 3 mLs by nebulization every 4 (four) hours as needed. 06/23/20  Yes Faustino Congress, NP  loratadine (CLARITIN) 10 MG tablet Take 1 tablet (10 mg total) by mouth daily as needed for allergies. 01/01/20  Yes McLean-Scocuzza, Nino Glow, MD  meclizine (ANTIVERT) 25 MG tablet Take 1 tablet (25 mg total) by  mouth 3 (three) times daily as needed for dizziness. 10/20/19  Yes Norval Gable, MD  montelukast (SINGULAIR) 10 MG tablet Take 1 tablet (10 mg total) by mouth at bedtime. 01/01/20  Yes McLean-Scocuzza, Nino Glow, MD  omeprazole (PRILOSEC) 40 MG capsule Take 1 capsule (40 mg total) by mouth daily. 08/19/20  Yes Jonathon Bellows, MD  traZODone (DESYREL) 50 MG tablet Take 0.5-1 tablets (25-50 mg total) by mouth at bedtime as needed for sleep. 01/01/20  Yes McLean-Scocuzza, Nino Glow, MD  triamcinolone ointment (KENALOG) 0.5 % Apply 1 application topically 2 (two) times daily. 09/13/20  Yes Cook, Jayce G, DO  pantoprazole (PROTONIX) 40 MG tablet Take 1 tablet (40 mg total) by mouth daily. 30 min before 04/09/20 08/30/20  McLean-Scocuzza, Nino Glow, MD    Allergies as of 08/19/2020 - Review Complete 08/19/2020  Allergen Reaction Noted  . Shellfish allergy Itching 10/30/2017    Family History  Problem Relation Age of Onset  . Asthma Mother   . COPD Mother   . Diabetes Mother   . Miscarriages / [place] Mother   . Diabetes Father   . Heart disease Father   . Hypertension Father   . Heart failure Father   . Heart attack Father   . Asthma Daughter   . Miscarriages / [place] Daughter   . Arthritis Maternal Grandmother   . Asthma Maternal Grandmother   . Hyperlipidemia Maternal Grandmother   .  Diabetes Maternal Grandfather   . Stroke Paternal Grandmother   . Heart disease Brother     Social History   Socioeconomic History  . Marital status: Single    Spouse name: Not on file  . Number of children: Not on file  . Years of education: Not on file  . Highest education level: Not on file  Occupational History  . Not on file  Tobacco Use  . Smoking status: Current Some Day Smoker    Packs/day: 0.50    Types: Cigarettes  . Smokeless tobacco: Never Used  . Tobacco comment: 1 ppd   Vaping Use  . Vaping Use: Never used  Substance and Sexual Activity  . Alcohol use: Yes    Comment:  occassionally  . Drug use: No  . Sexual activity: Yes    Comment: men  Other Topics Concern  . Not on file  Social History Narrative   GED   Works [employer]    2 kids    No guns, wears selt belt, safe in relationship    Social Determinants of Radio broadcast assistant Strain: Not on [employer] Insecurity: Not on file  Transportation Needs: Not on file  Physical Activity: Not on file  Stress: Not on file  Social Connections: Not on file  Intimate Partner Violence: Not on file    Review of Systems: See HPI, otherwise negative ROS  Physical Exam: BP 120/77   Pulse 75   Temp (!) 97 F (36.1 C)   Resp 16   Ht 5\' 1"  (1.549 m)   Wt 66.2 kg   SpO2 99%   BMI 27.59 kg/m  General:   Alert,  pleasant and cooperative in NAD Head:  Normocephalic and atraumatic. Neck:  Supple; no masses or thyromegaly. Lungs:  Clear throughout to auscultation, normal respiratory effort.    Heart:  +S1, +S2, Regular rate and rhythm, No edema. Abdomen:  Soft, nontender and nondistended. Normal bowel sounds, without guarding, and without rebound.   Neurologic:  Alert and  oriented x4;  grossly normal neurologically.  Impression/Plan: Teresa Deleon is here for an endoscopy and colonoscopy  to be performed for  evaluation of dysphagia and colon cancer screening     Risks, benefits, limitations, and alternatives regarding endoscopy have been reviewed with the patient.  Questions have been answered.  All parties agreeable.   Jonathon Bellows, MD  09/17/2020, 9:16 AM

## 2020-09-17 NOTE — Op Note (Signed)
[facility] Gastroenterology Patient Name: Teresa Deleon Procedure Date: 09/17/2020 9:22 AM MRN: [id number] Account #: [id number] Date of Birth: May 24, 1966 Admit Type: Outpatient Age: 55 Room: [facility] ENDO ROOM 4 Gender: Female Note Status: Finalized Procedure:             Upper GI endoscopy Indications:           Dysphagia Providers:             Jonathon Bellows MD, MD Referring MD:          Nino Glow Mclean-Scocuzza MD, MD (Referring MD) Medicines:             Monitored Anesthesia Care Complications:         No immediate complications. Procedure:             Pre-Anesthesia Assessment:                        - Prior to the procedure, a History and Physical was                         performed, and patient medications, allergies and                         sensitivities were reviewed. The patient's tolerance                         of previous anesthesia was reviewed.                        - The risks and benefits of the procedure and the                         sedation options and risks were discussed with the                         patient. All questions were answered and informed                         consent was obtained.                        - ASA Grade Assessment: II - A patient with mild                         systemic disease.                        After obtaining informed consent, the endoscope was                         passed under direct vision. Throughout the procedure,                         the patient's blood pressure, pulse, and oxygen                         saturations were monitored continuously. The Endoscope                         was introduced through the mouth, and  advanced to the                         third part of duodenum. The upper GI endoscopy was                         accomplished with ease. The patient tolerated the                         procedure well. Findings:      One benign-appearing, intrinsic mild stenosis was  found in the lower       third of the esophagus. This stenosis measured 1.6 cm (inner diameter) x       less than one cm (in length). The stenosis was traversed. A TTS dilator       was passed through the scope. Dilation with a 15-16.5-18 mm balloon       dilator was performed to 18 mm. The dilation site was examined and       showed no change.      Normal mucosa was found in the entire esophagus. Biopsies were taken       with a cold forceps for histology.      The stomach was normal.      The cardia and gastric fundus were normal on retroflexion.      The examined duodenum was normal. Impression:            - Benign-appearing esophageal stenosis. Dilated.                        - Normal mucosa was found in the entire esophagus.                         Biopsied.                        - Normal stomach.                        - Normal examined duodenum. Recommendation:        - Await pathology results.                        - Perform a colonoscopy today. Procedure Code(s):     --- Professional ---                        [phone], Esophagogastroduodenoscopy, flexible,                         transoral; with transendoscopic balloon dilation of                         esophagus (less than 30 mm diameter)                        [id number], 59, Esophagogastroduodenoscopy, flexible,                         transoral; with biopsy, single or multiple Diagnosis Code(s):     --- Professional ---  K22.2, Esophageal obstruction                        R13.10, Dysphagia, unspecified CPT copyright 2019 American Medical Association. All rights reserved. The codes documented in this report are preliminary and upon coder review may  be revised to meet current compliance requirements. Jonathon Bellows, MD Jonathon Bellows MD, MD 09/17/2020 9:39:26 AM This report has been signed electronically. Number of Addenda: 0 Note Initiated On: 09/17/2020 9:22 AM Estimated Blood Loss:  Estimated blood loss:  none.      [facility]

## 2020-09-17 NOTE — Transfer of Care (Signed)
Immediate Anesthesia Transfer of Care Note  Patient: Teresa Deleon  Procedure(s) Performed: Procedure(s): COLONOSCOPY WITH PROPOFOL (N/A) ESOPHAGOGASTRODUODENOSCOPY (EGD) WITH PROPOFOL (N/A)  Patient Location: PACU and Endoscopy Unit  Anesthesia Type:General  Level of Consciousness: sedated  Airway & Oxygen Therapy: Patient Spontanous Breathing and Patient connected to nasal cannula oxygen  Post-op Assessment: Report given to RN and Post -op Vital signs reviewed and stable  Post vital signs: Reviewed and stable  Last Vitals:  Vitals:   09/17/20 1000 09/17/20 1005  BP: 108/67 108/67  Pulse: 75 74  Resp: 18 19  Temp: (!) 36.1 C   SpO2: 57% 32%    Complications: No apparent anesthesia complications

## 2020-09-17 NOTE — Anesthesia Preprocedure Evaluation (Signed)
Anesthesia Evaluation  Patient identified by MRN, date of birth, ID band Patient awake    Reviewed: Allergy & Precautions, NPO status , Patient's Chart, lab work & pertinent test results  Airway Mallampati: III       Dental   Pulmonary asthma , Current Smoker,    Pulmonary exam normal        Cardiovascular negative cardio ROS Normal cardiovascular exam+ Valvular Problems/Murmurs      Neuro/Psych  Headaches,  Neuromuscular disease negative psych ROS   GI/Hepatic Neg liver ROS, GERD  ,  Endo/Other  negative endocrine ROS  Renal/GU negative Renal ROS     Musculoskeletal negative musculoskeletal ROS (+)   Abdominal Normal abdominal exam  (+)   Peds  Hematology negative hematology ROS (+)   Anesthesia Other Findings Past Medical History: No date: Asthma No date: Back pain No date: Chronic sinusitis No date: GERD (gastroesophageal reflux disease) No date: Heart murmur No date: Leaky heart valve No date: Migraine No date: Migraine No date: Trichomonas infection  Reproductive/Obstetrics                             Anesthesia Physical Anesthesia Plan  ASA: III  Anesthesia Plan: General   Post-op Pain Management:    Induction: Intravenous  PONV Risk Score and Plan:   Airway Management Planned: Nasal Cannula  Additional Equipment:   Intra-op Plan:   Post-operative Plan:   Informed Consent: I have reviewed the patients History and Physical, chart, labs and discussed the procedure including the risks, benefits and alternatives for the proposed anesthesia with the patient or authorized representative who has indicated his/her understanding and acceptance.     Dental advisory given  Plan Discussed with: CRNA and Surgeon  Anesthesia Plan Comments:         Anesthesia Quick Evaluation

## 2020-09-20 ENCOUNTER — Encounter: Payer: Self-pay | Admitting: Gastroenterology

## 2020-09-20 LAB — SURGICAL PATHOLOGY

## 2020-09-21 ENCOUNTER — Encounter: Payer: Self-pay | Admitting: Gastroenterology

## 2020-09-21 NOTE — Anesthesia Postprocedure Evaluation (Signed)
Anesthesia Post Note  Patient: Teresa Deleon  Procedure(s) Performed: COLONOSCOPY WITH PROPOFOL (N/A ) ESOPHAGOGASTRODUODENOSCOPY (EGD) WITH PROPOFOL (N/A )  Patient location during evaluation: Endoscopy Anesthesia Type: General Level of consciousness: awake and alert and oriented Pain management: pain level controlled Vital Signs Assessment: post-procedure vital signs reviewed and stable Respiratory status: spontaneous breathing Cardiovascular status: blood pressure returned to baseline Anesthetic complications: no   No complications documented.   Last Vitals:  Vitals:   09/17/20 1020 09/17/20 1030  BP: 117/70 108/77  Pulse: 65 61  Resp: 14 18  Temp:    SpO2: 99% 99%    Last Pain:  Vitals:   09/18/20 1148  TempSrc:   PainSc: 0-No pain                 Doshia Dalia

## 2020-10-11 ENCOUNTER — Other Ambulatory Visit: Payer: Self-pay

## 2020-10-11 ENCOUNTER — Ambulatory Visit (INDEPENDENT_AMBULATORY_CARE_PROVIDER_SITE_OTHER): Payer: Self-pay | Admitting: Gastroenterology

## 2020-10-11 VITALS — BP 136/78 | HR 77 | Ht 61.0 in | Wt 145.0 lb

## 2020-10-11 DIAGNOSIS — K222 Esophageal obstruction: Secondary | ICD-10-CM

## 2020-10-11 DIAGNOSIS — R1310 Dysphagia, unspecified: Secondary | ICD-10-CM

## 2020-10-11 MED ORDER — OMEPRAZOLE 40 MG PO CPDR: 40.0000 mg | DELAYED_RELEASE_CAPSULE | Freq: Two times a day (BID) | ORAL | 1 refills | Status: DC

## 2020-10-11 NOTE — Progress Notes (Signed)
Jonathon Bellows MD, MRCP(U.K) [address]  [place]  [place], [place] [address]  Main: [phone]  Fax: [phone]   Primary Care Physician: McLean-Scocuzza, Nino Glow, MD  Primary Gastroenterologist:  Dr. Jonathon Bellows   Follow-up for dysphagia  HPI: Teresa Deleon is a 55 y.o. female    Summary of history :  Initially referred and seen on 08/19/2020 for dysphagia.  Affecting liquids more than solids.  Worse when she ate food such as peanuts.  History of asthma.  Was not on a PPI.  Interval history   08/19/2020-10/11/2020  09/17/2020: EGD: Mild stenosis noted at the lower end of the esophagus and dilated to 18 mm.  Biopsies of the esophagus were taken to rule out eosinophilic esophagitis and were negative.  She also underwent a colonoscopy on the same day for colon cancer screening average risk which was normal.   She still having difficulty swallowing liquids after her dilation.  She admits she only took her omeprazole for 5 days and has not been compliant since.  Current Outpatient Medications  Medication Sig Dispense Refill  . acetaminophen (TYLENOL) 500 MG tablet Take 1 tablet (500 mg total) by mouth every 4 (four) hours as needed. 90 tablet 3  . albuterol (VENTOLIN HFA) 108 (90 Base) MCG/ACT inhaler Inhale 1-2 puffs into the lungs every 4 (four) hours as needed for wheezing or shortness of breath. 18 g 11  . atorvastatin (LIPITOR) 10 MG tablet Take 1 tablet (10 mg total) by mouth daily. At night 90 tablet 3  . fluticasone (FLONASE) 50 MCG/ACT nasal spray Place 2 sprays into both nostrils daily. Prn max 2 sprays 16 g 6  . hydrOXYzine (ATARAX/VISTARIL) 25 MG tablet Take 1 tablet (25 mg total) by mouth every 8 (eight) hours as needed for itching. 30 tablet 0  . ipratropium-albuterol (DUONEB) 0.5-2.5 (3) MG/3ML SOLN Take 3 mLs by nebulization every 4 (four) hours as needed. 360 mL 0  . loratadine (CLARITIN) 10 MG tablet Take 1 tablet (10 mg total) by mouth daily as needed for  allergies. 90 tablet 3  . meclizine (ANTIVERT) 25 MG tablet Take 1 tablet (25 mg total) by mouth 3 (three) times daily as needed for dizziness. 30 tablet 0  . montelukast (SINGULAIR) 10 MG tablet Take 1 tablet (10 mg total) by mouth at bedtime. 90 tablet 3  . omeprazole (PRILOSEC) 40 MG capsule Take 1 capsule (40 mg total) by mouth daily. 90 capsule 3  . traZODone (DESYREL) 50 MG tablet Take 0.5-1 tablets (25-50 mg total) by mouth at bedtime as needed for sleep. 30 tablet 5  . triamcinolone ointment (KENALOG) 0.5 % Apply 1 application topically 2 (two) times daily. 30 g 0   No current facility-administered medications for this visit.    Allergies as of 10/11/2020 - Review Complete 10/11/2020  Allergen Reaction Noted  . Shellfish allergy Itching 10/30/2017  . Strawberry (diagnostic) Itching 09/13/2020  . Tomato Itching 09/13/2020    ROS:  General: Negative for anorexia, weight loss, fever, chills, fatigue, weakness. ENT: Negative for hoarseness, difficulty swallowing , nasal congestion. CV: Negative for chest pain, angina, palpitations, dyspnea on exertion, peripheral edema.  Respiratory: Negative for dyspnea at rest, dyspnea on exertion, cough, sputum, wheezing.  GI: See history of present illness. GU:  Negative for dysuria, hematuria, urinary incontinence, urinary frequency, nocturnal urination.  Endo: Negative for unusual weight change.    Physical Examination:   BP 136/78   Pulse 77   Ht 5\' 1"  (  1.549 m)   Wt 145 lb (65.8 kg)   BMI 27.40 kg/m   General: Well-nourished, well-developed in no acute distress.  Eyes: No icterus. Conjunctivae pink. Neuro: Alert and oriented x 3.  Grossly intact. Skin: Warm and dry, no jaundice.   Psych: Alert and cooperative, normal mood and affect.   Imaging Studies: No results found.  Assessment and Plan:   Teresa Deleon is a 55 y.o. y/o female here to follow-up for dysphagia and GERD.  Status post dilation of mild esophageal stricture  in February 20 22 to 18 mm.  Since her dilation she has not taken her omeprazole to treat any reflux related esophageal dysmotility.  I explained to her the importance of doing the same.  I suggested she restart taking omeprazole 40 mg twice a day and I will have a video call with her in 6 weeks time to see how she is doing if not doing better she would require esophageal manometry and pH impedance testing to be performed    Dr Jonathon Bellows  MD,MRCP [facility]) Follow up in 6 weeks video visit

## 2020-10-14 ENCOUNTER — Ambulatory Visit: Payer: BC Managed Care – PPO | Admitting: Gastroenterology

## 2020-11-29 ENCOUNTER — Telehealth: Payer: Self-pay | Admitting: Gastroenterology

## 2020-12-02 ENCOUNTER — Ambulatory Visit
Admission: EM | Admit: 2020-12-02 | Discharge: 2020-12-02 | Disposition: A | Discharge status: Home / Self Care | Payer: No Typology Code available for payment source | Attending: Emergency Medicine | Admitting: Emergency Medicine

## 2020-12-02 ENCOUNTER — Other Ambulatory Visit: Payer: Self-pay

## 2020-12-02 DIAGNOSIS — Z20822 Contact with and (suspected) exposure to covid-19: Secondary | ICD-10-CM | POA: Insufficient documentation

## 2020-12-02 DIAGNOSIS — G8929 Other chronic pain: Secondary | ICD-10-CM | POA: Insufficient documentation

## 2020-12-02 DIAGNOSIS — M545 Low back pain, unspecified: Secondary | ICD-10-CM | POA: Diagnosis present

## 2020-12-02 DIAGNOSIS — K429 Umbilical hernia without obstruction or gangrene: Secondary | ICD-10-CM

## 2020-12-02 DIAGNOSIS — R197 Diarrhea, unspecified: Secondary | ICD-10-CM

## 2020-12-02 MED ORDER — ONDANSETRON 4 MG PO TBDP: 4.0000 mg | ORAL_TABLET | Freq: Three times a day (TID) | ORAL | 0 refills | Status: DC | PRN

## 2020-12-02 MED ORDER — IBUPROFEN 600 MG PO TABS: 600.0000 mg | ORAL_TABLET | Freq: Four times a day (QID) | ORAL | 0 refills | Status: DC | PRN

## 2020-12-02 MED ORDER — TIZANIDINE HCL 4 MG PO TABS: 4.0000 mg | ORAL_TABLET | Freq: Three times a day (TID) | ORAL | 0 refills | Status: DC | PRN

## 2020-12-02 NOTE — ED Provider Notes (Signed)
HPI  SUBJECTIVE:  Teresa Deleon is a 55 y.o. female who presents with multiple issues:  First, she reports watery, nonbloody diarrhea starting yesterday.  She has a Mudlogger with a "stomach virus".  She reports 6 episodes yesterday, 3 episodes overnight and 2 episodes today.  She reports fevers T-max 100.2.  No nausea, vomiting, nasal congestion or rhinorrhea, loss of sense of smell or taste, cough, shortness of breath, raw or undercooked foods, questionable leftovers.  She tried Pepto-Bismol with some improvement in her symptoms.  No aggravating factors  Second, she reports 2 months of achy, intermittent upper abdominal pain.  It has not changed recently.  She reports that the pain can last up to hours and she reports a soft, reducible mass at the umbilicus.  States that it sometimes becomes sore after doing a lot of pushing.  She states that she has been doing lots of pushing at work recently.  The abdominal pain is not associated with eating.  Third, she reports an acute exacerbation of her chronic back pain starting about 3 weeks ago after lifting up her grandson.  She reports bilateral radicular pain, which is not new.  No leg weakness, saddle anesthesia, urinary fecal incontinence, urinary retention.  She has tried Tylenol and a muscle relaxant with improvement in her symptoms.  Symptoms are worse with movement.  She has a past medical history of asymptomatic COVID in 2019, GERD -followed by GI-herniated disks.  No history of peptic ulcer disease, abdominal surgeries, diabetes, hypertension, atrial fibrillation, mesenteric ischemia, chronic kidney disease.  PMD: [facility].    Past Medical History:  Diagnosis Date  . Asthma   . Back pain   . Chronic sinusitis   . GERD (gastroesophageal reflux disease)   . Heart murmur   . Leaky heart valve   . Migraine   . Migraine   . Trichomonas infection     Past Surgical History:  Procedure Laterality Date  . BREAST SURGERY     left  breast abcess  . COLONOSCOPY WITH PROPOFOL N/A 09/17/2020   Procedure: COLONOSCOPY WITH PROPOFOL;  Surgeon: Jonathon Bellows, MD;  Location[facility] ENDOSCOPY;  Service: Gastroenterology;  Laterality: N/A;  . ESOPHAGOGASTRODUODENOSCOPY (EGD) WITH PROPOFOL N/A 09/17/2020   Procedure: ESOPHAGOGASTRODUODENOSCOPY (EGD) WITH PROPOFOL;  Surgeon: Jonathon Bellows, MD;  Location: [facility] ENDOSCOPY;  Service: Gastroenterology;  Laterality: N/A;    Family History  Problem Relation Age of Onset  . Asthma Mother   . COPD Mother   . Diabetes Mother   . Miscarriages / [place] Mother   . Diabetes Father   . Heart disease Father   . Hypertension Father   . Heart failure Father   . Heart attack Father   . Asthma Daughter   . Miscarriages / [place] Daughter   . Arthritis Maternal Grandmother   . Asthma Maternal Grandmother   . Hyperlipidemia Maternal Grandmother   . Diabetes Maternal Grandfather   . Stroke Paternal Grandmother   . Heart disease Brother     Social History   Tobacco Use  . Smoking status: Current Some Day Smoker    Packs/day: 0.50    Types: Cigarettes  . Smokeless tobacco: Never Used  . Tobacco comment: 1 ppd   Vaping Use  . Vaping Use: Never used  Substance Use Topics  . Alcohol use: Yes    Comment: occassionally  . Drug use: No    No current facility-administered medications for this encounter.  Current Outpatient Medications:  .  acetaminophen (  TYLENOL) 500 MG tablet, Take 1 tablet (500 mg total) by mouth every 4 (four) hours as needed., Disp: 90 tablet, Rfl: 3 .  albuterol (VENTOLIN HFA) 108 (90 Base) MCG/ACT inhaler, Inhale 1-2 puffs into the lungs every 4 (four) hours as needed for wheezing or shortness of breath., Disp: 18 g, Rfl: 11 .  atorvastatin (LIPITOR) 10 MG tablet, Take 1 tablet (10 mg total) by mouth daily. At night, Disp: 90 tablet, Rfl: 3 .  fluticasone (FLONASE) 50 MCG/ACT nasal spray, Place 2 sprays into both nostrils daily. Prn max 2 sprays, Disp: 16 g, Rfl:  6 .  hydrOXYzine (ATARAX/VISTARIL) 25 MG tablet, Take 1 tablet (25 mg total) by mouth every 8 (eight) hours as needed for itching., Disp: 30 tablet, Rfl: 0 .  ibuprofen (ADVIL) 600 MG tablet, Take 1 tablet (600 mg total) by mouth every 6 (six) hours as needed., Disp: 30 tablet, Rfl: 0 .  ipratropium-albuterol (DUONEB) 0.5-2.5 (3) MG/3ML SOLN, Take 3 mLs by nebulization every 4 (four) hours as needed., Disp: 360 mL, Rfl: 0 .  loratadine (CLARITIN) 10 MG tablet, Take 1 tablet (10 mg total) by mouth daily as needed for allergies., Disp: 90 tablet, Rfl: 3 .  meclizine (ANTIVERT) 25 MG tablet, Take 1 tablet (25 mg total) by mouth 3 (three) times daily as needed for dizziness., Disp: 30 tablet, Rfl: 0 .  montelukast (SINGULAIR) 10 MG tablet, Take 1 tablet (10 mg total) by mouth at bedtime., Disp: 90 tablet, Rfl: 3 .  omeprazole (PRILOSEC) 40 MG capsule, Take 1 capsule (40 mg total) by mouth 2 (two) times daily., Disp: 180 capsule, Rfl: 1 .  ondansetron (ZOFRAN ODT) 4 MG disintegrating tablet, Take 1 tablet (4 mg total) by mouth every 8 (eight) hours as needed for nausea or vomiting., Disp: 20 tablet, Rfl: 0 .  tiZANidine (ZANAFLEX) 4 MG tablet, Take 1 tablet (4 mg total) by mouth every 8 (eight) hours as needed for muscle spasms., Disp: 30 tablet, Rfl: 0 .  traZODone (DESYREL) 50 MG tablet, Take 0.5-1 tablets (25-50 mg total) by mouth at bedtime as needed for sleep., Disp: 30 tablet, Rfl: 5 .  triamcinolone ointment (KENALOG) 0.5 %, Apply 1 application topically 2 (two) times daily., Disp: 30 g, Rfl: 0  Allergies  Allergen Reactions  . Shellfish Allergy Itching  . Strawberry (Diagnostic) Itching  . Tomato Itching     ROS  As noted in HPI.   Physical Exam  BP 137/87 (BP Location: Right Arm)   Pulse 74   Temp 98.2 F (36.8 C) (Oral)   Resp 18   Ht 5\' 1"  (1.549 m)   Wt 65.8 kg   SpO2 99%   BMI 27.40 kg/m   Constitutional: Well developed, well nourished, no acute distress Eyes:  EOMI,  conjunctiva normal bilaterally HENT: Normocephalic, atraumatic,mucus membranes moist Respiratory: Normal inspiratory effort Cardiovascular: Normal rate GI: Normal appearance, soft, nontender, nondistended.  Positive small nontender reducible umbilical hernia.  Active bowel sounds.  No rebound, guarding skin: No rash, skin intact Musculoskeletal: + bilateral paralumbar tenderness, + muscle spasm. No bony tenderness. Bilateral lower extremities nontender.  Baseline ROM with intact DP pulses, .No pain with int/ext rotation flex/extension hips bilaterally. SLR neg bilaterally. Sensation intact to light touch bilaterally over both legs, DTR's symmetric and intact bilaterally KJ, Motor symmetric bilateral 5/5 hip flexion, quadriceps, hamstrings, EHL, foot dorsiflexion, foot plantarflexion, gait somewhat antalgic but without apparent new ataxia. Neurologic: Alert & oriented x 3, no focal neuro deficits Psychiatric:  Speech and behavior appropriate   ED Course   Medications - No data to display  Orders Placed This Encounter  Procedures  . SARS CORONAVIRUS 2 (TAT 6-24 HRS) Nasopharyngeal Nasopharyngeal Swab    Standing Status:   Standing    Number of Occurrences:   1    Order Specific Question:   Is this test for diagnosis or screening    Answer:   Diagnosis of ill patient    Order Specific Question:   Symptomatic for COVID-19 as defined by CDC    Answer:   Yes    Order Specific Question:   Date of Symptom Onset    Answer:   12/01/2020    Order Specific Question:   Hospitalized for COVID-19    Answer:   No    Order Specific Question:   Admitted to ICU for COVID-19    Answer:   No    Order Specific Question:   Previously tested for COVID-19    Answer:   Yes    Order Specific Question:   Resident in a congregate (group) care setting    Answer:   No    Order Specific Question:   Employed in healthcare setting    Answer:   No    Order Specific Question:   Pregnant    Answer:   No    Order  Specific Question:   Has patient completed COVID vaccination(s) (2 doses of Pfizer/Moderna 1 dose of [employer])    Answer:   Yes    Order Specific Question:   Has patient completed COVID Booster / 3rd dose    Answer:   No  . Ambulatory referral to General Surgery    Referral Priority:   Routine    Referral Type:   Surgical    Referral Reason:   Specialty Services Required    Requested Specialty:   General Surgery    Number of Visits Requested:   1    No results found for this or any previous visit (from the past 24 hour(s)). No results found.  ED Clinical Impression  1. Umbilical hernia without obstruction and without gangrene   2. Diarrhea of presumed infectious origin   3. Encounter for laboratory testing for COVID-19 virus   4. Acute exacerbation of chronic low back pain      ED Assessment/Plan  1.  Diarrhea.  Suspect a viral illness.  Patient states that it is slowing down.  Will send home with Zofran for its constipating properties.  Will check COVID.  2.  Umbilical hernia.  No evidence of obstruction or strangulation.  Will referral to general surgery for elective repair.  3.  Acute exacerbation of chronic back pain.  No red flags on history or physical.  Home with Tylenol/ibuprofen, Zanaflex.  Patient states steroids generally do not help.  Work note for the next several days.    Discussed labs,  MDM, treatment plan, and plan for follow-up with patient. Discussed sn/sx that should prompt return to the ED. patient agrees with plan.   Meds ordered this encounter  Medications  . ondansetron (ZOFRAN ODT) 4 MG disintegrating tablet    Sig: Take 1 tablet (4 mg total) by mouth every 8 (eight) hours as needed for nausea or vomiting.    Dispense:  20 tablet    Refill:  0  . ibuprofen (ADVIL) 600 MG tablet    Sig: Take 1 tablet (600 mg total) by mouth every 6 (six) hours as needed.  Dispense:  30 tablet    Refill:  0  . tiZANidine (ZANAFLEX) 4 MG tablet    Sig:  Take 1 tablet (4 mg total) by mouth every 8 (eight) hours as needed for muscle spasms.    Dispense:  30 tablet    Refill:  0      *This clinic note was created using [employer]. Therefore, there may be occasional mistakes despite careful proofreading.  ?    Melynda Ripple, MD 12/02/20 [phone]

## 2020-12-02 NOTE — ED Triage Notes (Signed)
Patient states that she has been having abdominal pain and diarrhea. States that she thinks she may have a hernia. States that she does lifting and shifting at work. States that something pops out at her naval and she pushes it back in.  Also states that she has been having low back pain with radiation down her legs and thinks this may be her sciatica flaring up.

## 2020-12-02 NOTE — Discharge Instructions (Addendum)
Push electrolyte containing fluids such as Pedialyte or Gatorade.  Zofran will help slow the diarrhea down further.  COVID test will be back in 6 to 24 hours.  Follow-up with [facility] ASAP for evaluation and elective repair of your umbilical hernia  May take 600 mg of ibuprofen combined with 1000 mg of Tylenol together 3-4 times a day.  Zanaflex as needed for muscle spasms.

## 2020-12-03 LAB — SARS CORONAVIRUS 2 (TAT 6-24 HRS): SARS Coronavirus 2: NEGATIVE

## 2020-12-06 ENCOUNTER — Encounter: Payer: Self-pay | Admitting: Surgery

## 2020-12-06 ENCOUNTER — Other Ambulatory Visit: Payer: Self-pay

## 2020-12-06 ENCOUNTER — Ambulatory Visit (INDEPENDENT_AMBULATORY_CARE_PROVIDER_SITE_OTHER): Payer: Self-pay | Admitting: Surgery

## 2020-12-06 VITALS — BP 129/78 | HR 71 | Temp 98.2°F | Ht 60.0 in | Wt 145.0 lb

## 2020-12-06 DIAGNOSIS — K429 Umbilical hernia without obstruction or gangrene: Secondary | ICD-10-CM

## 2020-12-06 NOTE — Progress Notes (Signed)
12/06/2020  Reason for Visit:  Umbilical hernia  History of Present Illness: Teresa Deleon is a 55 y.o. female presenting for evaluation of an umbilical hernia.  She reports that she's noticed this for the past two months or so.  She noticed it when she was at work doing a strenuous activity.  This type of activity is also what aggravates it.  She denies any fevers, chills, chest pain, shortness of breath, nausea, or vomiting.  Reports that she feels it may have gotten bigger or that at least more tissue seems to protrude.  This is associated with some discomfort that is not just at the umbilicus but also seems to go side to side at the level of the umbilicus.  She has not had any abdominal surgeries in the past.  She recently had an issue with gastroenteritis and non-bloody diarrhea, and after taking PeptoBismol, she had significant constipation, which aggravated the symptoms of the hernia.  Of note, looking at the patient's chart, she had a CT scan of the abdomen/pelvis on 02/06/2018, and although not mentioned in the report, she already had a small umbilical hernia at the time, measuring < 1 cm.  Past Medical History: Past Medical History:  Diagnosis Date  . Asthma   . Back pain   . Chronic sinusitis   . GERD (gastroesophageal reflux disease)   . Heart murmur   . Leaky heart valve   . Migraine   . Migraine   . Trichomonas infection      Past Surgical History: Past Surgical History:  Procedure Laterality Date  . BREAST SURGERY     left breast abcess  . COLONOSCOPY WITH PROPOFOL N/A 09/17/2020   Procedure: COLONOSCOPY WITH PROPOFOL;  Surgeon: Jonathon Bellows, MD;  Location: [facility] ENDOSCOPY;  Service: Gastroenterology;  Laterality: N/A;  . ESOPHAGOGASTRODUODENOSCOPY (EGD) WITH PROPOFOL N/A 09/17/2020   Procedure: ESOPHAGOGASTRODUODENOSCOPY (EGD) WITH PROPOFOL;  Surgeon: Jonathon Bellows, MD;  Location: [facility] ENDOSCOPY;  Service: Gastroenterology;  Laterality: N/A;    Home Medications: Prior  to Admission medications   Medication Sig Start Date End Date Taking? Authorizing Provider  acetaminophen (TYLENOL) 500 MG tablet Take 1 tablet (500 mg total) by mouth every 4 (four) hours as needed. 03/05/19   McLean-Scocuzza, Nino Glow, MD  albuterol (VENTOLIN HFA) 108 (90 Base) MCG/ACT inhaler Inhale 1-2 puffs into the lungs every 4 (four) hours as needed for wheezing or shortness of breath. 01/01/20   McLean-Scocuzza, Nino Glow, MD  atorvastatin (LIPITOR) 10 MG tablet Take 1 tablet (10 mg total) by mouth daily. At night 02/02/20   McLean-Scocuzza, Nino Glow, MD  fluticasone (FLONASE) 50 MCG/ACT nasal spray Place 2 sprays into both nostrils daily. Prn max 2 sprays 04/04/18   McLean-Scocuzza, Nino Glow, MD  hydrOXYzine (ATARAX/VISTARIL) 25 MG tablet Take 1 tablet (25 mg total) by mouth every 8 (eight) hours as needed for itching. 09/13/20   Coral Spikes, DO  ibuprofen (ADVIL) 600 MG tablet Take 1 tablet (600 mg total) by mouth every 6 (six) hours as needed. 12/02/20   Melynda Ripple, MD  ipratropium-albuterol (DUONEB) 0.5-2.5 (3) MG/3ML SOLN Take 3 mLs by nebulization every 4 (four) hours as needed. 06/23/20   Faustino Congress, NP  loratadine (CLARITIN) 10 MG tablet Take 1 tablet (10 mg total) by mouth daily as needed for allergies. 01/01/20   McLean-Scocuzza, Nino Glow, MD  meclizine (ANTIVERT) 25 MG tablet Take 1 tablet (25 mg total) by mouth 3 (three) times daily as needed for dizziness. 10/20/19  Norval Gable, MD  montelukast (SINGULAIR) 10 MG tablet Take 1 tablet (10 mg total) by mouth at bedtime. 01/01/20   McLean-Scocuzza, Nino Glow, MD  omeprazole (PRILOSEC) 40 MG capsule Take 1 capsule (40 mg total) by mouth 2 (two) times daily. 10/11/20   Jonathon Bellows, MD  ondansetron (ZOFRAN ODT) 4 MG disintegrating tablet Take 1 tablet (4 mg total) by mouth every 8 (eight) hours as needed for nausea or vomiting. 12/02/20   Melynda Ripple, MD  tiZANidine (ZANAFLEX) 4 MG tablet Take 1 tablet (4 mg total) by mouth every  8 (eight) hours as needed for muscle spasms. 12/02/20   Melynda Ripple, MD  traZODone (DESYREL) 50 MG tablet Take 0.5-1 tablets (25-50 mg total) by mouth at bedtime as needed for sleep. 01/01/20   McLean-Scocuzza, Nino Glow, MD  triamcinolone ointment (KENALOG) 0.5 % Apply 1 application topically 2 (two) times daily. 09/13/20   Coral Spikes, DO  pantoprazole (PROTONIX) 40 MG tablet Take 1 tablet (40 mg total) by mouth daily. 30 min before 04/09/20 08/30/20  McLean-Scocuzza, Nino Glow, MD    Allergies: Allergies  Allergen Reactions  . Shellfish Allergy Itching  . Strawberry (Diagnostic) Itching  . Tomato Itching    Social History:  reports that she has been smoking cigarettes. She has been smoking about 0.50 packs per day. She has never used smokeless tobacco. She reports current alcohol use. She reports that she does not use drugs.   Family History: Family History  Problem Relation Age of Onset  . Asthma Mother   . COPD Mother   . Diabetes Mother   . Miscarriages / [place] Mother   . Diabetes Father   . Heart disease Father   . Hypertension Father   . Heart failure Father   . Heart attack Father   . Asthma Daughter   . Miscarriages / [place] Daughter   . Arthritis Maternal Grandmother   . Asthma Maternal Grandmother   . Hyperlipidemia Maternal Grandmother   . Diabetes Maternal Grandfather   . Stroke Paternal Grandmother   . Heart disease Brother     Review of Systems: Review of Systems  Constitutional: Negative for chills and fever.  HENT: Negative for hearing loss.   Respiratory: Negative for shortness of breath.   Cardiovascular: Negative for chest pain.  Gastrointestinal: Positive for abdominal pain and constipation. Negative for diarrhea, nausea and vomiting.  Genitourinary: Negative for dysuria.  Musculoskeletal: Negative for myalgias.  Skin: Negative for rash.  Neurological: Negative for dizziness.  Psychiatric/Behavioral: Negative for depression.     Physical Exam BP 129/78   Pulse 71   Temp 98.2 F (36.8 C) (Oral)   Ht 5' (1.524 m)   Wt 145 lb (65.8 kg)   SpO2 100%   BMI 28.32 kg/m  CONSTITUTIONAL: No acute distress HEENT:  Normocephalic, atraumatic, extraocular motion intact. NECK: Trachea is midline, and there is no jugular venous distension.  RESPIRATORY:  Lungs are clear, and breath sounds are equal bilaterally. Normal respiratory effort without pathologic use of accessory muscles. CARDIOVASCULAR: Heart is regular without murmurs, gallops, or rubs. GI: The abdomen is soft, non-distended, with some discomfort to palpation at the umbilicus.  The patient has a small defect, about 1.5 cm, mostly at the umbilical/supraumbilical area.  It appears to contain fat, and is partially reducible.  MUSCULOSKELETAL:  Normal muscle strength and tone in all four extremities.  No peripheral edema or cyanosis. SKIN: Skin turgor is normal. There are no pathologic skin lesions.  NEUROLOGIC:  Motor and sensation is grossly normal.  Cranial nerves are grossly intact. PSYCH:  Alert and oriented to person, place and time. Affect is normal.  Laboratory Analysis: No results found for this or any previous visit (from the past 24 hour(s)).  Imaging: No results found.  Assessment and Plan: This is a 55 y.o. female with a partially reducible umbilical hernia.  --Discussed with the patient what a hernia is and how they arise.  Hers is partially reducible, and today only with mild discomfort on deep palpation.  Has not had episodes of severe pain.  Discussed with her that unfortunately there is no medical/conservative way to repair the hernia or reduce its size, and that surgery would be the only way to fix it.  She is interested in having surgery, but she's about to transition to a different job and needs to ask her employer about time off for her surgery and recovery.  Discussed with her that this would be an outpatient surgery, and that based on the  work that she does, would need a restriction of no heavy lifting/pushing of no more than 10-15 lbs for 6 weeks.  She may return to work as early as a week after work as long as she can do lighter duties.  Reviewed the surgery at length as well as the risks of bleeding, infection, and injury to surrounding structures.  We may use mesh as well to reinforce the repair given her line of work.  She will check with her employer and get back to [place] with date for surgery.  Discussed with her that if her employer requires any paperwork to be completed, we'll be glad to help.  Face-to-face time spent with the patient and care providers was 60 minutes, with more than 50% of the time spent counseling, educating, and coordinating care of the patient.     Melvyn Neth, [facility]

## 2020-12-06 NOTE — Patient Instructions (Addendum)
Let us know when you would like to proceed with the Umbilical Hernia Repair. Bring any paperwork that you need and we will fill it out.   If you have any concerns or questions, please feel free to contact our office.    Umbilical Hernia, Adult  A hernia is a bulge of tissue that pushes through an opening between muscles. An umbilical hernia happens in the abdomen, near the belly button (umbilicus). The hernia may contain tissues from the small intestine, large intestine, or fatty tissue covering the intestines (omentum). Umbilical hernias in adults tend to get worse over time, and they require surgical treatment. There are several types of umbilical hernias. You may have:  A hernia located just above or below the umbilicus (indirect hernia). This is the most common type of umbilical hernia in adults.  A hernia that forms through an opening formed by the umbilicus (direct hernia).  A hernia that comes and goes (reducible hernia). A reducible hernia may be visible only when you strain, lift something heavy, or cough. This type of hernia can be pushed back into the abdomen (reduced).  A hernia that traps abdominal tissue inside the hernia (incarcerated hernia). This type of hernia cannot be reduced.  A hernia that cuts off blood flow to the tissues inside the hernia (strangulated hernia). The tissues can start to die if this happens. This type of hernia requires emergency treatment. What are the causes? An umbilical hernia happens when tissue inside the abdomen presses on a weak area of the abdominal muscles. What increases the risk? You may have a greater risk of this condition if you:  Are obese.  Have had several pregnancies.  Have a buildup of fluid inside your abdomen (ascites).  Have had surgery that weakens the abdominal muscles. What are the signs or symptoms? The main symptom of this condition is a painless bulge at or near the belly button. A reducible hernia may be visible  only when you strain, lift something heavy, or cough. Other symptoms may include:  Dull pain.  A feeling of pressure. Symptoms of a strangulated hernia may include:  Pain that gets increasingly worse.  Nausea and vomiting.  Pain when pressing on the hernia.  Skin over the hernia becoming red or purple.  Constipation.  Blood in the stool. How is this diagnosed? This condition may be diagnosed based on:  A physical exam. You may be asked to cough or strain while standing. These actions increase the pressure inside your abdomen and force the hernia through the opening in your muscles. Your health care provider may try to reduce the hernia by pressing on it.  Your symptoms and medical history. How is this treated? Surgery is the only treatment for an umbilical hernia. Surgery for a strangulated hernia is done as soon as possible. If you have a small hernia that is not incarcerated, you may need to lose weight before having surgery. Follow these instructions at home:  Lose weight, if told by your health care provider.  Do not try to push the hernia back in.  Watch your hernia for any changes in color or size. Tell your health care provider if any changes occur.  You may need to avoid activities that increase pressure on your hernia.  Do not lift anything that is heavier than 10 lb (4.5 kg) until your health care provider says that this is safe.  Take over-the-counter and prescription medicines only as told by your health care provider.  Keep all  follow-up visits as told by your health care provider. This is important. Contact a health care provider if:  Your hernia gets larger.  Your hernia becomes painful. Get help right away if:  You develop sudden, severe pain near the area of your hernia.  You have pain as well as nausea or vomiting.  You have pain and the skin over your hernia changes color.  You develop a fever. This information is not intended to replace  advice given to you by your health care provider. Make sure you discuss any questions you have with your health care provider. Document Revised: 08/22/2017 Document Reviewed: 01/08/2017 Elsevier Patient Education  Teresa Deleon.

## 2020-12-07 ENCOUNTER — Encounter: Payer: Self-pay | Admitting: Surgery

## 2020-12-08 NOTE — Progress Notes (Signed)
Ok, I will keep her in my hold section.  Thank you.

## 2020-12-27 ENCOUNTER — Ambulatory Visit
Admission: EM | Admit: 2020-12-27 | Discharge: 2020-12-27 | Disposition: A | Discharge status: Home / Self Care | Payer: No Typology Code available for payment source | Attending: Physician Assistant | Admitting: Physician Assistant

## 2020-12-27 ENCOUNTER — Other Ambulatory Visit: Payer: Self-pay

## 2020-12-27 ENCOUNTER — Ambulatory Visit (INDEPENDENT_AMBULATORY_CARE_PROVIDER_SITE_OTHER): Payer: No Typology Code available for payment source

## 2020-12-27 ENCOUNTER — Encounter: Payer: Self-pay | Admitting: Emergency Medicine

## 2020-12-27 DIAGNOSIS — R079 Chest pain, unspecified: Secondary | ICD-10-CM | POA: Diagnosis not present

## 2020-12-27 DIAGNOSIS — M79602 Pain in left arm: Secondary | ICD-10-CM

## 2020-12-27 DIAGNOSIS — R0789 Other chest pain: Secondary | ICD-10-CM | POA: Diagnosis present

## 2020-12-27 LAB — CBC WITH DIFFERENTIAL/PLATELET
Abs Immature Granulocytes: 0.02 10*3/uL (ref 0.00–0.07)
Basophils Absolute: 0.1 10*3/uL (ref 0.0–0.1)
Basophils Relative: 1 %
Eosinophils Absolute: 0.2 10*3/uL (ref 0.0–0.5)
Eosinophils Relative: 2 %
HCT: 41.5 % (ref 36.0–46.0)
Hemoglobin: 14 g/dL (ref 12.0–15.0)
Immature Granulocytes: 0 %
Lymphocytes Relative: 40 %
Lymphs Abs: 3.4 10*3/uL (ref 0.7–4.0)
MCH: 30.2 pg (ref 26.0–34.0)
MCHC: 33.7 g/dL (ref 30.0–36.0)
MCV: 89.6 fL (ref 80.0–100.0)
Monocytes Absolute: 0.4 10*3/uL (ref 0.1–1.0)
Monocytes Relative: 5 %
Neutro Abs: 4.6 10*3/uL (ref 1.7–7.7)
Neutrophils Relative %: 52 %
Platelets: 331 10*3/uL (ref 150–400)
RBC: 4.63 MIL/uL (ref 3.87–5.11)
RDW: 12.9 % (ref 11.5–15.5)
WBC: 8.7 10*3/uL (ref 4.0–10.5)
nRBC: 0 % (ref 0.0–0.2)

## 2020-12-27 LAB — TROPONIN I (HIGH SENSITIVITY): Troponin I (High Sensitivity): 3 ng/L (ref <18)

## 2020-12-27 LAB — LIPASE, BLOOD: Lipase: 29 U/L (ref 11–51)

## 2020-12-27 LAB — COMPREHENSIVE METABOLIC PANEL
ALT: 13 U/L (ref 0–44)
AST: 15 U/L (ref 15–41)
Albumin: 4.3 g/dL (ref 3.5–5.0)
Alkaline Phosphatase: 98 U/L (ref 38–126)
Anion gap: 6 (ref 5–15)
BUN: 12 mg/dL (ref 6–20)
CO2: 23 mmol/L (ref 22–32)
Calcium: 9 mg/dL (ref 8.9–10.3)
Chloride: 108 mmol/L (ref 98–111)
Creatinine, Ser: 0.75 mg/dL (ref 0.44–1.00)
GFR, Estimated: >60 mL/min (ref >60)
Glucose, Bld: 102 mg/dL — ABNORMAL HIGH (ref 70–99)
Potassium: 3.7 mmol/L (ref 3.5–5.1)
Sodium: 137 mmol/L (ref 135–145)
Total Bilirubin: 0.4 mg/dL (ref 0.3–1.2)
Total Protein: 7.1 g/dL (ref 6.5–8.1)

## 2020-12-27 MED ORDER — BACLOFEN 10 MG PO TABS: 10.0000 mg | ORAL_TABLET | Freq: Two times a day (BID) | ORAL | 0 refills | Status: AC

## 2020-12-27 MED ORDER — PREDNISONE 10 MG PO TABS: ORAL_TABLET | ORAL | 0 refills | Status: DC

## 2020-12-27 NOTE — ED Provider Notes (Signed)
[facility]    CSN: [id number] Arrival date & time: 12/27/20  1137      History   Chief Complaint Chief Complaint  Patient presents with  . Chest Pain  . Arm Pain    HPI Teresa Deleon is a 55 y.o. female presenting with constant pressure-like central and left-sided chest pain and pain in the left shoulder.  She denies any numbness, weakness or tingling she says nothing makes the pain better or worse.  She says her pain is not getting better or getting worse.  Also admits to some epigastric and left upper quadrant pain.  Denies any palpitations, dizziness, syncope or presyncope.  No shortness of breath or difficulty breathing.  Patient denies any sweats.  Patient denies any fever, cough, congestion.  She says she was sick with a viral respiratory illness a month ago but fully recovered.  No known exposure to COVID-19.  Patient says her job is physically demanding and she is unsure if that is related.  She says that she consistently has to push and lift heavy things.  Patient has not taken anything for her discomfort.  She has a history of asthma, GERD, and heart murmur.  Patient denies any history of heart attack or stroke.  Patient is a current longtime smoker.  Patient admits to some nausea.  States she has a hernia in her stomach.  She does take PPI for her GERD.  She is unsure if her symptoms are GERD related.  No other complaints or concerns.  HPI  Past Medical History:  Diagnosis Date  . Asthma   . Back pain   . Chronic sinusitis   . GERD (gastroesophageal reflux disease)   . Heart murmur   . Leaky heart valve   . Migraine   . Migraine   . Trichomonas infection     Patient Active Problem List   Diagnosis Date Noted  . Dysphagia 08/19/2020  . Colon cancer screening 08/19/2020  . URI with cough and congestion 06/12/2020  . Suspected COVID-19 virus infection 06/12/2020  . Bronchitis with asthma, acute 06/12/2020  . Abnormal MRI, lumbar spine 04/19/2020  .  Cervicalgia 04/19/2020  . Lumbar radiculopathy 04/19/2020  . Vitamin D deficiency 01/26/2020  . HLD (hyperlipidemia) 01/26/2020  . RLS (restless legs syndrome) 01/01/2020  . Mild intermittent asthma with acute exacerbation 01/01/2020  . Frontal sinusitis 01/01/2020  . Cigarette nicotine dependence without complication 97/08/6376  . Lumbar herniated disc 06/24/2019  . Insomnia 06/24/2019  . Female pelvic pain 06/24/2019  . Diarrhea 03/05/2019  . Nausea and vomiting 03/05/2019  . COVID-19 virus detected 02/17/2019  . 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 Chronic sinusitis 04/04/2018  . Allergic rhinitis 04/04/2018  . Tobacco abuse 04/04/2018  . Migraine without status migrainosus, not intractable 04/04/2018  . Cardiac murmur 04/04/2018  . Gastroesophageal reflux disease 04/04/2018  . Prediabetes 04/04/2018  . Fibroids 04/04/2018    Past Surgical History:  Procedure Laterality Date  . BREAST SURGERY     left breast abcess  . COLONOSCOPY WITH PROPOFOL N/A 09/17/2020   Procedure: COLONOSCOPY WITH PROPOFOL;  Surgeon: Jonathon Bellows, MD;  Location: [facility] ENDOSCOPY;  Service: Gastroenterology;  Laterality: N/A;  . ESOPHAGOGASTRODUODENOSCOPY (EGD) WITH PROPOFOL N/A 09/17/2020   Procedure: ESOPHAGOGASTRODUODENOSCOPY (EGD) WITH PROPOFOL;  Surgeon: Jonathon Bellows, MD;  Location: [facility] ENDOSCOPY;  Service: Gastroenterology;  Laterality: N/A;    OB History   No obstetric history on file.      Home  Medications    Prior to Admission medications   Medication Sig Start Date End Date Taking? Authorizing Provider  acetaminophen (TYLENOL) 500 MG tablet Take 1 tablet (500 mg total) by mouth every 4 (four) hours as needed. 03/05/19  Yes McLean-Scocuzza, Nino Glow, MD  albuterol (VENTOLIN HFA) 108 (90 Base) MCG/ACT inhaler Inhale 1-2 puffs into the lungs every 4 (four) hours as needed for wheezing or shortness of breath. 01/01/20  Yes McLean-Scocuzza, Nino Glow, MD  atorvastatin  (LIPITOR) 10 MG tablet Take 1 tablet (10 mg total) by mouth daily. At night 02/02/20  Yes McLean-Scocuzza, Nino Glow, MD  baclofen (LIORESAL) 10 MG tablet Take 1 tablet (10 mg total) by mouth 2 (two) times daily for 7 days. 12/27/20 01/03/21 Yes Laurene Footman B, PA-C  fluticasone (FLONASE) 50 MCG/ACT nasal spray Place 2 sprays into both nostrils daily. Prn max 2 sprays 04/04/18  Yes McLean-Scocuzza, Nino Glow, MD  hydrOXYzine (ATARAX/VISTARIL) 25 MG tablet Take 1 tablet (25 mg total) by mouth every 8 (eight) hours as needed for itching. 09/13/20  Yes Cook, Jayce G, DO  montelukast (SINGULAIR) 10 MG tablet Take 1 tablet (10 mg total) by mouth at bedtime. 01/01/20  Yes McLean-Scocuzza, Nino Glow, MD  predniSONE (DELTASONE) 10 MG tablet Take 6 tablets PO on the first day and decrease by 1 tablet daily until complete 12/27/20  Yes Laurene Footman B, PA-C  tiZANidine (ZANAFLEX) 4 MG tablet Take 1 tablet (4 mg total) by mouth every 8 (eight) hours as needed for muscle spasms. 12/02/20  Yes Melynda Ripple, MD  traZODone (DESYREL) 50 MG tablet Take 0.5-1 tablets (25-50 mg total) by mouth at bedtime as needed for sleep. 01/01/20  Yes McLean-Scocuzza, Nino Glow, MD  ibuprofen (ADVIL) 600 MG tablet Take 1 tablet (600 mg total) by mouth every 6 (six) hours as needed. 12/02/20   Melynda Ripple, MD  ipratropium-albuterol (DUONEB) 0.5-2.5 (3) MG/3ML SOLN Take 3 mLs by nebulization every 4 (four) hours as needed. 06/23/20   Faustino Congress, NP  loratadine (CLARITIN) 10 MG tablet Take 1 tablet (10 mg total) by mouth daily as needed for allergies. 01/01/20   McLean-Scocuzza, Nino Glow, MD  meclizine (ANTIVERT) 25 MG tablet Take 1 tablet (25 mg total) by mouth 3 (three) times daily as needed for dizziness. 10/20/19   Norval Gable, MD  omeprazole (PRILOSEC) 40 MG capsule Take 1 capsule (40 mg total) by mouth 2 (two) times daily. 10/11/20   Jonathon Bellows, MD  ondansetron (ZOFRAN ODT) 4 MG disintegrating tablet Take 1 tablet (4 mg total) by  mouth every 8 (eight) hours as needed for nausea or vomiting. 12/02/20   Melynda Ripple, MD  triamcinolone ointment (KENALOG) 0.5 % Apply 1 application topically 2 (two) times daily. 09/13/20   Coral Spikes, DO  pantoprazole (PROTONIX) 40 MG tablet Take 1 tablet (40 mg total) by mouth daily. 30 min before 04/09/20 08/30/20  McLean-Scocuzza, Nino Glow, MD    Family History Family History  Problem Relation Age of Onset  . Asthma Mother   . COPD Mother   . Diabetes Mother   . Miscarriages / [place] Mother   . Diabetes Father   . Heart disease Father   . Hypertension Father   . Heart failure Father   . Heart attack Father   . Asthma Daughter   . Miscarriages / [place] Daughter   . Arthritis Maternal Grandmother   . Asthma Maternal Grandmother   . Hyperlipidemia Maternal Grandmother   . Diabetes Maternal Grandfather   .  Stroke Paternal Grandmother   . Heart disease Brother     Social History Social History   Tobacco Use  . Smoking status: Current Some Day Smoker    Packs/day: 0.50    Types: Cigarettes  . Smokeless tobacco: Never Used  . Tobacco comment: 1 ppd   Vaping Use  . Vaping Use: Never used  Substance Use Topics  . Alcohol use: Yes    Comment: occassionally  . Drug use: No     Allergies   Shellfish allergy, Strawberry (diagnostic), and Tomato   Review of Systems Review of Systems  Constitutional: Negative for chills, diaphoresis, fatigue and fever.  HENT: Negative for congestion, rhinorrhea and sore throat.   Respiratory: Negative for cough and shortness of breath.   Cardiovascular: Positive for chest pain. Negative for palpitations and leg swelling.  Gastrointestinal: Positive for abdominal pain and nausea. Negative for diarrhea and vomiting.  Musculoskeletal: Positive for arthralgias. Negative for back pain, myalgias, neck pain and neck stiffness.  Skin: Negative for rash.  Neurological: Negative for dizziness, weakness, numbness and headaches.      Physical Exam Triage Vital Signs ED Triage Vitals  Enc Vitals Group     BP 12/27/20 1155 127/72     Pulse Rate 12/27/20 1155 63     Resp 12/27/20 1155 18     Temp 12/27/20 1155 98.6 F (37 C)     Temp Source 12/27/20 1155 Oral     SpO2 12/27/20 1155 98 %     Weight 12/27/20 1152 145 lb 1 oz (65.8 kg)     Height 12/27/20 1152 5' (1.524 m)     Head Circumference --      Peak Flow --      Pain Score 12/27/20 1150 10     Pain Loc --      Pain Edu? --      Excl. in [place]? --    No data found.  Updated Vital Signs BP 127/72 (BP Location: Left Arm)   Pulse 63   Temp 98.6 F (37 C) (Oral)   Resp 18   Ht 5' (1.524 m)   Wt 145 lb 1 oz (65.8 kg)   SpO2 98%   BMI 28.33 kg/m       Physical Exam Vitals and nursing note reviewed.  Constitutional:      General: She is not in acute distress.    Appearance: Normal appearance. She is not ill-appearing or toxic-appearing.  HENT:     Head: Normocephalic and atraumatic.     Nose: Nose normal.     Mouth/Throat:     Mouth: Mucous membranes are moist.     Pharynx: Oropharynx is clear.  Eyes:     General: No scleral icterus.       Right eye: No discharge.        Left eye: No discharge.     Conjunctiva/sclera: Conjunctivae normal.  Cardiovascular:     Rate and Rhythm: Normal rate and regular rhythm.     Heart sounds: Normal heart sounds.  Pulmonary:     Effort: Pulmonary effort is normal. No respiratory distress.     Breath sounds: Normal breath sounds. No wheezing, rhonchi or rales.  Chest:     Chest wall: Tenderness (TTP sternum and left chest/pectoralis muscle) present.  Abdominal:     Palpations: Abdomen is soft.     Tenderness: There is abdominal tenderness (epigastric and LUQ). There is no guarding.  Musculoskeletal:     Cervical back:  Neck supple.  Skin:    General: Skin is dry.  Neurological:     General: No focal deficit present.     Mental Status: She is alert. Mental status is at baseline.     Motor: No  weakness.     Gait: Gait normal.  Psychiatric:        Mood and Affect: Mood normal.        Behavior: Behavior normal.        Thought Content: Thought content normal.      UC Treatments / Results  Labs (all labs ordered are listed, but only abnormal results are displayed) Labs Reviewed  COMPREHENSIVE METABOLIC PANEL - Abnormal; Notable for the following components:      Result Value   Glucose, Bld 102 (*)    All other components within normal limits  CBC WITH DIFFERENTIAL/PLATELET  LIPASE, BLOOD  TROPONIN I (HIGH SENSITIVITY)    EKG   Radiology DG Chest 2 View  Result Date: 12/27/2020 CLINICAL DATA:  Chest pain EXAM: CHEST - 2 VIEW COMPARISON:  June 23, 2020 FINDINGS: Lungs are clear. Heart size and pulmonary vascularity are normal. No adenopathy. No bone lesions. No pneumothorax. IMPRESSION: Lungs clear.  Heart size normal. Electronically Signed   By: Lowella Grip III M.D.   On: 12/27/2020 12:18    Procedures ED EKG  Date/Time: 12/27/2020 12:07 PM Performed by: Danton Clap, PA-C Authorized by: Danton Clap, PA-C   ECG reviewed by ED Physician in the absence of a cardiologist: yes   Previous ECG:    Previous ECG:  Unavailable Interpretation:    Interpretation: normal   Rate:    ECG rate:  63 bpm   ECG rate assessment: normal   Rhythm:    Rhythm: sinus rhythm   Ectopy:    Ectopy: none   QRS:    QRS axis:  Normal   QRS intervals:  Normal   QRS conduction: normal   ST segments:    ST segments:  Normal T waves:    T waves: normal   Comments:     Normal sinus rhythm. Regular rate.   (including critical care time)  Medications Ordered in UC Medications - No data to display  Initial Impression / Assessment and Plan / UC Course  I have reviewed the triage vital signs and the nursing notes.  Pertinent labs & imaging results that were available during my care of the patient were reviewed by me and considered in my medical decision making (see  chart for details).   55 year old female presenting with central left-sided chest pain that radiates to the left shoulder.  Symptoms ongoing x2 days.  Describes it as a pressure.  No associated shortness of breath, cough, palpitations, dizziness or weakness.  She does have some associated upper abdominal discomfort and nausea without vomiting.  Vital signs are all normal and stable and patient is overall well-appearing.  Exam significant for tenderness of the chest diffusely on the left side and central chest.  Heart regular rate and rhythm and chest is clear to auscultation.  No leg swelling.  She also has tenderness of the epigastric region and left upper quadrant.  No guarding.  No rebound.  EKG performed today shows normal sinus rhythm and regular rate at 63 bpm.  Chest x-ray ordered today to assess for possible pneumonia or other acute cardiopulmonary disease especially given her history of viral illness a month ago.  Labs ordered today: CBC, CMP, lipase and troponin.  At this point, suspect patient's symptoms most likely related to musculoskeletal condition and also possibly GERD/hiatal hernia discomfort.  Chest x-ray independently viewed by me.  Chest x-ray is within normal limits.  CBC, lipase, and troponin all normal.  Metabolic panel significant for slightly elevated glucose at 102, otherwise normal.  Reviewed all results with patient.  Advised her that I suspect her symptoms are due to musculoskeletal cause.  Treating her with prednisone and cyclobenzaprine.  She has a history of reflux and I do not want to give her an NSAID and potentially make that worse.  Advised her to rest and increase fluids.  Advised trying heat on the areas of discomfort.  Advised that she should start feeling better over the next week or so but if she does not she should be seen again either by PCP, urgent care or ER.  Reviewed ED red flag signs and symptoms with patient.  Work note given.  Final Clinical  Impressions(s) / UC Diagnoses   Final diagnoses:  Chest wall pain  Left arm pain     Discharge Instructions     Your work-up today is reassuring.  Your EKG does not suggest a problem with your heart.  The chest x-ray is normal.  All of your lab work is also normal.  I suspect that your symptoms are musculoskeletal in origin.  I have sent over prednisone and a muscle relaxer.  You can also take Tylenol if you desire.  Continue taking your acid reflux medication just in case any of the discomfort is related to flareup of your acid reflux.  Rest and increase fluids.  Please follow-up with PCP if this continues.  Please go to emergency department for any severe acute worsening of symptoms especially if your chest pain worsens or is associated with dizziness, weakness, vomiting, sweats, breathing difficulty, racing heart or feeling faint or passing out.    ED Prescriptions    Medication Sig Dispense Auth. Provider   predniSONE (DELTASONE) 10 MG tablet Take 6 tablets PO on the first day and decrease by 1 tablet daily until complete 21 tablet Laurene Footman B, PA-C   baclofen (LIORESAL) 10 MG tablet Take 1 tablet (10 mg total) by mouth 2 (two) times daily for 7 days. 14 tablet Gretta Cool     PDMP not reviewed this encounter.   Danton Clap, PA-C 12/27/20 1258

## 2020-12-27 NOTE — ED Triage Notes (Signed)
Patient c/o left side chest pain and arm pain that started 2 days ago. She does report that she has been nauseated and fatigued.

## 2020-12-27 NOTE — Discharge Instructions (Signed)
Your work-up today is reassuring.  Your EKG does not suggest a problem with your heart.  The chest x-ray is normal.  All of your lab work is also normal.  I suspect that your symptoms are musculoskeletal in origin.  I have sent over prednisone and a muscle relaxer.  You can also take Tylenol if you desire.  Continue taking your acid reflux medication just in case any of the discomfort is related to flareup of your acid reflux.  Rest and increase fluids.  Please follow-up with PCP if this continues.  Please go to emergency department for any severe acute worsening of symptoms especially if your chest pain worsens or is associated with dizziness, weakness, vomiting, sweats, breathing difficulty, racing heart or feeling faint or passing out.

## 2021-01-10 ENCOUNTER — Telehealth: Payer: Self-pay

## 2021-01-10 ENCOUNTER — Telehealth: Payer: Self-pay | Admitting: Surgery

## 2021-01-10 NOTE — Telephone Encounter (Signed)
Patient has been advised of Pre-Admission date/time, COVID Testing date and Surgery date.  Surgery Date: 02/10/21 Preadmission Testing Date: 02/02/21 (phone 1p-5p) Covid Testing Date: Not needed.   -  Patient has been made aware to call [phone], between 1-3:00pm the day before surgery, to find out what time to arrive for surgery.

## 2021-01-10 NOTE — Telephone Encounter (Signed)
Medical clearance faxed to Dr. Linus Orn Mclean-Scocuzza at [phone].

## 2021-01-10 NOTE — Addendum Note (Signed)
Addended by: Riki Sheer on: 01/10/2021 12:00 PM   Modules accepted: Orders, SmartSet

## 2021-01-18 ENCOUNTER — Telehealth: Payer: Self-pay | Admitting: Internal Medicine

## 2021-01-18 NOTE — Telephone Encounter (Signed)
I am no longer PCP for this patient and will need new PCP to establish to clear her for umbilical hernia repair we have not been seeing her since 05/2020 due to her hanging up the phone on staff and not being pleasant to staff calling out of work frequently and wanting PCP to sign off on this, no shows

## 2021-01-25 ENCOUNTER — Telehealth: Payer: Self-pay

## 2021-01-25 NOTE — Telephone Encounter (Signed)
Dr. Karlyn Agee is no longer pt's PCP and the clearance has been retracted. Pt's surgery is scheduled for February 10, 2021.  I spoke with pt on 01/19/2021 about finding a PCP. Pt verbalized understanding and stated that she will call us back when she found one. On 01/21/2021 and 01/25/2021, I LVM for pt to call the office.  I will fax medical clearance once pt gets established with new PCP.

## 2021-02-01 ENCOUNTER — Telehealth: Payer: Self-pay | Admitting: Surgery

## 2021-02-01 NOTE — Telephone Encounter (Signed)
Received call back from the patient.  She is not able to find a primary care doctor to clear her for surgery which is scheduled for 02/10/21.  At patient's request surgery is cancelled. She also cancelled her follow up appointment with Dr. Hampton Abbot.

## 2021-02-01 NOTE — Telephone Encounter (Signed)
I have called the patient and left message for her to call us as we need to know if she has established care with a new primary care doctor.  She needs medical clearance prior to surgery.  At this rate, looks like surgery may need to be postponed.

## 2021-02-02 ENCOUNTER — Other Ambulatory Visit: Admission: RE | Admit: 2021-02-02 | Payer: No Typology Code available for payment source | Source: Ambulatory Visit

## 2021-02-02 ENCOUNTER — Ambulatory Visit: Payer: Self-pay | Admitting: Surgery

## 2021-02-10 ENCOUNTER — Ambulatory Visit: Admit: 2021-02-10 | Payer: No Typology Code available for payment source | Admitting: Surgery

## 2021-02-10 SURGERY — REPAIR, HERNIA, UMBILICAL, ADULT
Anesthesia: General

## 2021-03-21 ENCOUNTER — Other Ambulatory Visit: Payer: Self-pay

## 2021-03-21 ENCOUNTER — Emergency Department
Admission: EM | Admit: 2021-03-21 | Discharge: 2021-03-21 | Disposition: A | Discharge status: Home / Self Care | Payer: No Typology Code available for payment source | Attending: Student in an Organized Health Care Education/Training Program | Admitting: Student in an Organized Health Care Education/Training Program

## 2021-03-21 ENCOUNTER — Encounter: Payer: Self-pay | Admitting: Emergency Medicine

## 2021-03-21 ENCOUNTER — Ambulatory Visit (INDEPENDENT_AMBULATORY_CARE_PROVIDER_SITE_OTHER): Payer: No Typology Code available for payment source

## 2021-03-21 ENCOUNTER — Ambulatory Visit (INDEPENDENT_AMBULATORY_CARE_PROVIDER_SITE_OTHER)
Admission: EM | Admit: 2021-03-21 | Discharge: 2021-03-21 | Disposition: A | Discharge status: Home / Self Care | Payer: No Typology Code available for payment source | Source: Home / Self Care

## 2021-03-21 DIAGNOSIS — M549 Dorsalgia, unspecified: Secondary | ICD-10-CM | POA: Insufficient documentation

## 2021-03-21 DIAGNOSIS — Z79899 Other long term (current) drug therapy: Secondary | ICD-10-CM | POA: Diagnosis not present

## 2021-03-21 DIAGNOSIS — R079 Chest pain, unspecified: Secondary | ICD-10-CM | POA: Insufficient documentation

## 2021-03-21 DIAGNOSIS — Z8616 Personal history of COVID-19: Secondary | ICD-10-CM | POA: Insufficient documentation

## 2021-03-21 DIAGNOSIS — J45909 Unspecified asthma, uncomplicated: Secondary | ICD-10-CM | POA: Diagnosis not present

## 2021-03-21 DIAGNOSIS — R0602 Shortness of breath: Secondary | ICD-10-CM

## 2021-03-21 DIAGNOSIS — F17210 Nicotine dependence, cigarettes, uncomplicated: Secondary | ICD-10-CM | POA: Diagnosis not present

## 2021-03-21 LAB — CBC WITH DIFFERENTIAL/PLATELET
Abs Immature Granulocytes: 0.02 10*3/uL (ref 0.00–0.07)
Basophils Absolute: 0.1 10*3/uL (ref 0.0–0.1)
Basophils Relative: 1 %
Eosinophils Absolute: 0.2 10*3/uL (ref 0.0–0.5)
Eosinophils Relative: 2 %
HCT: 41.7 % (ref 36.0–46.0)
Hemoglobin: 14.4 g/dL (ref 12.0–15.0)
Immature Granulocytes: 0 %
Lymphocytes Relative: 42 %
Lymphs Abs: 3.1 10*3/uL (ref 0.7–4.0)
MCH: 30.8 pg (ref 26.0–34.0)
MCHC: 34.5 g/dL (ref 30.0–36.0)
MCV: 89.3 fL (ref 80.0–100.0)
Monocytes Absolute: 0.3 10*3/uL (ref 0.1–1.0)
Monocytes Relative: 4 %
Neutro Abs: 3.8 10*3/uL (ref 1.7–7.7)
Neutrophils Relative %: 51 %
Platelets: 324 10*3/uL (ref 150–400)
RBC: 4.67 MIL/uL (ref 3.87–5.11)
RDW: 13.5 % (ref 11.5–15.5)
WBC: 7.4 10*3/uL (ref 4.0–10.5)
nRBC: 0 % (ref 0.0–0.2)

## 2021-03-21 LAB — BASIC METABOLIC PANEL
Anion gap: 10 (ref 5–15)
BUN: 10 mg/dL (ref 6–20)
CO2: 26 mmol/L (ref 22–32)
Calcium: 10.8 mg/dL — ABNORMAL HIGH (ref 8.9–10.3)
Chloride: 103 mmol/L (ref 98–111)
Creatinine, Ser: 0.74 mg/dL (ref 0.44–1.00)
GFR, Estimated: >60 mL/min (ref >60)
Glucose, Bld: 110 mg/dL — ABNORMAL HIGH (ref 70–99)
Potassium: 3.9 mmol/L (ref 3.5–5.1)
Sodium: 139 mmol/L (ref 135–145)

## 2021-03-21 LAB — TROPONIN I (HIGH SENSITIVITY)
Troponin I (High Sensitivity): 3 ng/L (ref <18)
Troponin I (High Sensitivity): 4 ng/L (ref <18)
Troponin I (High Sensitivity): 4 ng/L (ref <18)

## 2021-03-21 LAB — D-DIMER, QUANTITATIVE: D-Dimer, Quant: 0.31 ug/mL-FEU (ref 0.00–0.50)

## 2021-03-21 MED ORDER — PREDNISONE 20 MG PO TABS: 40.0000 mg | ORAL_TABLET | Freq: Every day | ORAL | 0 refills | Status: AC

## 2021-03-21 MED ORDER — CYCLOBENZAPRINE HCL 10 MG PO TABS: 10.0000 mg | ORAL_TABLET | Freq: Three times a day (TID) | ORAL | 0 refills | Status: DC | PRN

## 2021-03-21 NOTE — Discharge Instructions (Addendum)
-  Chest x-ray is negative for any fracture or focal pneumonia as a possible cause for the pain. -Troponin is negative for any acute cardiac issue. -Other concern will be for a blood clot in the lungs which would require evaluation in the ER with a CT scan. Recommend being seen in the ER for further evaluation.

## 2021-03-21 NOTE — ED Triage Notes (Signed)
Pt here from [facility] for a CT scan of her chest. Pt went for SOB and L side back pain. Pt had blood work done but needs a CT scan to rule out a blood clot. Pt stable.

## 2021-03-21 NOTE — ED Notes (Signed)
Patient is being discharged from the Urgent Care and sent to the Emergency Department via POV. Per Denman George, PA, patient is in need of higher level of care due to chest pain, shortness of breath. Patient is aware and verbalizes understanding of plan of care.  Vitals:   03/21/21 0921  BP: (!) 152/74  Pulse: 60  Resp: (!) 22  Temp: (!) 97.2 F (36.2 C)  SpO2: 98%

## 2021-03-21 NOTE — ED Provider Notes (Addendum)
[facility]    CSN: [id number] Arrival date & time: 03/21/21  0847      History   Chief Complaint Chief Complaint  Patient presents with   Back Pain   Shortness of Breath    HPI Teresa Deleon is a 55 y.o. female.   Patient is a 55 year old female with past medical history of asthma, back pain, GERD, heart murmur, and migraines who presents with complaint of left back pain.  Patient states she was initially okay at work and then had acute onset of back pain between the shoulder blades.  Patient is a pain is worse with breathing.  She now states the pain is more in her mid left upper back.  She took a Tums thinking it could be her GERD but had no relief.  Patient denies any numbness or tingling, nausea, dizziness, abdominal pain.  States the pain does not feel like her normal esophageal pain or back pain.   Past Medical History:  Diagnosis Date   Asthma    Back pain    Chronic sinusitis    GERD (gastroesophageal reflux disease)    Heart murmur    Leaky heart valve    Migraine    Migraine    Trichomonas infection     Patient Active Problem List   Diagnosis Date Noted   Dysphagia 08/19/2020   Colon cancer screening 08/19/2020   URI with cough and congestion 06/12/2020   Suspected COVID-19 virus infection 06/12/2020   Bronchitis with asthma, acute 06/12/2020   Abnormal MRI, lumbar spine 04/19/2020   Cervicalgia 04/19/2020   Lumbar radiculopathy 04/19/2020   Vitamin D deficiency 01/26/2020   HLD (hyperlipidemia) 01/26/2020   RLS (restless legs syndrome) 01/01/2020   Mild intermittent asthma with acute exacerbation 01/01/2020   Frontal sinusitis 01/01/2020   Cigarette nicotine dependence without complication [id number]   Lumbar herniated disc 06/24/2019   Insomnia 06/24/2019   Female pelvic pain 06/24/2019   Diarrhea 03/05/2019   Nausea and vomiting 03/05/2019   COVID-19 virus detected 02/17/2019   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  04/04/2018   Chronic sinusitis 04/04/2018   Allergic rhinitis 04/04/2018   Tobacco abuse 04/04/2018   Migraine without status migrainosus, not intractable 04/04/2018   Cardiac murmur 04/04/2018   Gastroesophageal reflux disease 04/04/2018   Prediabetes 04/04/2018   Fibroids 04/04/2018    Past Surgical History:  Procedure Laterality Date   BREAST SURGERY     left breast abcess   COLONOSCOPY WITH PROPOFOL N/A 09/17/2020   Procedure: COLONOSCOPY WITH PROPOFOL;  Surgeon: Jonathon Bellows, MD;  Location: [facility] ENDOSCOPY;  Service: Gastroenterology;  Laterality: N/A;   ESOPHAGOGASTRODUODENOSCOPY (EGD) WITH PROPOFOL N/A 09/17/2020   Procedure: ESOPHAGOGASTRODUODENOSCOPY (EGD) WITH PROPOFOL;  Surgeon: Jonathon Bellows, MD;  Location: [facility] ENDOSCOPY;  Service: Gastroenterology;  Laterality: N/A;    OB History   No obstetric history on file.      Home Medications    Prior to Admission medications   Medication Sig Start Date End Date Taking? Authorizing Provider  acetaminophen (TYLENOL) 500 MG tablet Take 1 tablet (500 mg total) by mouth every 4 (four) hours as needed. 03/05/19  Yes McLean-Scocuzza, Nino Glow, MD  albuterol (VENTOLIN HFA) 108 (90 Base) MCG/ACT inhaler Inhale 1-2 puffs into the lungs every 4 (four) hours as needed for wheezing or shortness of breath. 01/01/20  Yes McLean-Scocuzza, Nino Glow, MD  atorvastatin (LIPITOR) 10 MG tablet Take 1 tablet (10 mg total) by mouth daily. At night 02/02/20  Yes McLean-Scocuzza, Nino Glow, MD  fluticasone (FLONASE) 50 MCG/ACT nasal spray Place 2 sprays into both nostrils daily. Prn max 2 sprays 04/04/18  Yes McLean-Scocuzza, Nino Glow, MD  hydrOXYzine (ATARAX/VISTARIL) 25 MG tablet Take 1 tablet (25 mg total) by mouth every 8 (eight) hours as needed for itching. 09/13/20  Yes Cook, Jayce G, DO  ibuprofen (ADVIL) 600 MG tablet Take 1 tablet (600 mg total) by mouth every 6 (six) hours as needed. 12/02/20  Yes Melynda Ripple, MD  ipratropium-albuterol (DUONEB)  0.5-2.5 (3) MG/3ML SOLN Take 3 mLs by nebulization every 4 (four) hours as needed. 06/23/20  Yes Faustino Congress, NP  loratadine (CLARITIN) 10 MG tablet Take 1 tablet (10 mg total) by mouth daily as needed for allergies. 01/01/20  Yes McLean-Scocuzza, Nino Glow, MD  meclizine (ANTIVERT) 25 MG tablet Take 1 tablet (25 mg total) by mouth 3 (three) times daily as needed for dizziness. 10/20/19  Yes Norval Gable, MD  montelukast (SINGULAIR) 10 MG tablet Take 1 tablet (10 mg total) by mouth at bedtime. 01/01/20  Yes McLean-Scocuzza, Nino Glow, MD  omeprazole (PRILOSEC) 40 MG capsule Take 1 capsule (40 mg total) by mouth 2 (two) times daily. 10/11/20  Yes Jonathon Bellows, MD  tiZANidine (ZANAFLEX) 4 MG tablet Take 1 tablet (4 mg total) by mouth every 8 (eight) hours as needed for muscle spasms. 12/02/20  Yes Melynda Ripple, MD  traZODone (DESYREL) 50 MG tablet Take 0.5-1 tablets (25-50 mg total) by mouth at bedtime as needed for sleep. 01/01/20  Yes McLean-Scocuzza, Nino Glow, MD  ondansetron (ZOFRAN ODT) 4 MG disintegrating tablet Take 1 tablet (4 mg total) by mouth every 8 (eight) hours as needed for nausea or vomiting. 12/02/20   Melynda Ripple, MD  predniSONE (DELTASONE) 10 MG tablet Take 6 tablets PO on the first day and decrease by 1 tablet daily until complete 12/27/20   Laurene Footman B, PA-C  triamcinolone ointment (KENALOG) 0.5 % Apply 1 application topically 2 (two) times daily. 09/13/20   Coral Spikes, DO  pantoprazole (PROTONIX) 40 MG tablet Take 1 tablet (40 mg total) by mouth daily. 30 min before 04/09/20 08/30/20  McLean-Scocuzza, Nino Glow, MD    Family History Family History  Problem Relation Age of Onset   Asthma Mother    COPD Mother    Diabetes Mother    Miscarriages / [place] Mother    Diabetes Father    Heart disease Father    Hypertension Father    Heart failure Father    Heart attack Father    Asthma Daughter    Miscarriages / [place] Daughter    Arthritis Maternal Grandmother     Asthma Maternal Grandmother    Hyperlipidemia Maternal Grandmother    Diabetes Maternal Grandfather    Stroke Paternal Grandmother    Heart disease Brother     Social History Social History   Tobacco Use   Smoking status: Some Days    Packs/day: 0.50    Types: Cigarettes   Smokeless tobacco: Never   Tobacco comments:    1 ppd   Vaping Use   Vaping Use: Never used  Substance Use Topics   Alcohol use: Yes    Comment: occassionally   Drug use: No     Allergies   Shellfish allergy, Strawberry (diagnostic), and Tomato   Review of Systems Review of Systems as noted above in HPI.  Other systems reviewed and found negative   Physical Exam Triage Vital Signs ED Triage Vitals  Enc Vitals Group     BP 03/21/21 0921 (!) 152/74     Pulse Rate 03/21/21 0921 60     Resp 03/21/21 0921 (!) 22     Temp 03/21/21 0921 (!) 97.2 F (36.2 C)     Temp Source 03/21/21 0921 Oral     SpO2 03/21/21 0921 98 %     Weight 03/21/21 0916 145 lb 1 oz (65.8 kg)     Height 03/21/21 0916 5' (1.524 m)     Head Circumference --      Peak Flow --      Pain Score 03/21/21 0915 10     Pain Loc --      Pain Edu? --      Excl. in [place]? --    No data found.  Updated Vital Signs BP (!) 152/74 (BP Location: Left Arm)   Pulse 60   Temp (!) 97.2 F (36.2 C) (Oral)   Resp (!) 22   Ht 5' (1.524 m)   Wt 145 lb 1 oz (65.8 kg)   SpO2 98%   BMI 28.33 kg/m    Physical Exam Constitutional:      Appearance: She is well-developed.  HENT:     Head: Normocephalic and atraumatic.  Eyes:     Pupils: Pupils are equal, round, and reactive to light.  Cardiovascular:     Rate and Rhythm: Normal rate and regular rhythm.     Heart sounds: Murmur heard.  Chest:     Comments: No tenderness palpation of the anterior or posterior chest or axillary on the left side.  However patient does report pain with deep breaths and movement to the mid upper left back.  Pain is not reproducible with  palpation. Neurological:     Mental Status: She is alert.     UC Treatments / Results  Labs (all labs ordered are listed, but only abnormal results are displayed) Labs Reviewed  TROPONIN I (HIGH SENSITIVITY)    EKG Sinus bradycardia rate 55.  QTc 407.  No ST changes noted  Radiology DG Chest 2 View  Result Date: 03/21/2021 CLINICAL DATA:  Left chest pain. EXAM: CHEST - 2 VIEW COMPARISON:  12/27/2020 FINDINGS: The lungs are clear without focal pneumonia, edema, pneumothorax or pleural effusion. The cardiopericardial silhouette is within normal limits for size. The visualized bony structures of the thorax show no acute abnormality. IMPRESSION: No active cardiopulmonary disease. Electronically Signed   By: Misty Stanley M.D.   On: 03/21/2021 10:30    Procedures Procedures (including critical care time)  Medications Ordered in UC Medications - No data to display  Initial Impression / Assessment and Plan / UC Course  I have reviewed the triage vital signs and the nursing notes.  Pertinent labs & imaging results that were available during my care of the patient were reviewed by me and considered in my medical decision making (see chart for details).  Clinical Course as of 03/21/21 1109  Mon Mar 21, 2021  1058 Troponin I (High Sensitivity) [MH]    Clinical Course User Index [MH] Luvenia Redden, PA-C   Patient presents with onset of pain upper back between his shoulder blades initially now on the left.  EKG is negative.  High sensitivity troponin is negative at 3. CXR negative. Pain is not reproducible to palpation.  Is noted with deep breath and movement.  Differential includes pleurisy as well as pulmonary embolism.  There is some shortness of breath and the pain is  concerning as well.  Will recommend patient be seen in the ER as we do not have CT capabilities here and will need to rule out a clot. Final Clinical Impressions(s) / UC Diagnoses   Final diagnoses:  Shortness of  breath  Left-sided chest pain     Discharge Instructions      -Chest x-ray is negative for any fracture or focal pneumonia as a possible cause for the pain. -Troponin is negative for any acute cardiac issue. -Other concern will be for a blood clot in the lungs which would require evaluation in the ER with a CT scan. Recommend being seen in the ER for further evaluation.     ED Prescriptions   None    PDMP not reviewed this encounter.   Luvenia Redden, PA-C 03/21/21 1106  Patient advised of need to go to the ER.  She declined offer for ambulance and states she will have her mom or daughter take her to the ER.   Luvenia Redden, PA-C 03/21/21 1109

## 2021-03-21 NOTE — ED Provider Notes (Signed)
[facility] Emergency Department Provider Note    Event Date/Time   First MD Initiated Contact with Patient 03/21/21 1258     (approximate)  I have reviewed the triage vital signs and the nursing notes.   HISTORY  Chief Complaint Back Pain and Shortness of Breath    HPI Teresa Deleon is a 55 y.o. female with below listed past medical history presents to the ER for evaluation of back pain and also chest pain that occurred while she was at work.  States it was acute in onset not ripping or tearing did have some associated shortness of breath that is since resolved.  She was seen in urgent care and sent to the ER for further evaluation.  States that the discomfort is essentially resolved she denies any shortness of breath no hypoxia.  No history of DVT.  No prolonged travel no recent illnesses no immobilization.  Past Medical History:  Diagnosis Date   Asthma    Back pain    Chronic sinusitis    GERD (gastroesophageal reflux disease)    Heart murmur    Leaky heart valve    Migraine    Migraine    Trichomonas infection    Family History  Problem Relation Age of Onset   Asthma Mother    COPD Mother    Diabetes Mother    84 / [place] Mother    Diabetes Father    Heart disease Father    Hypertension Father    Heart failure Father    Heart attack Father    Asthma Daughter    Miscarriages / [place] Daughter    Arthritis Maternal Grandmother    Asthma Maternal Grandmother    Hyperlipidemia Maternal Grandmother    Diabetes Maternal Grandfather    Stroke Paternal Grandmother    Heart disease Brother    Past Surgical History:  Procedure Laterality Date   BREAST SURGERY     left breast abcess   COLONOSCOPY WITH PROPOFOL N/A 09/17/2020   Procedure: COLONOSCOPY WITH PROPOFOL;  Surgeon: Jonathon Bellows, MD;  Location: [facility] ENDOSCOPY;  Service: Gastroenterology;  Laterality: N/A;   ESOPHAGOGASTRODUODENOSCOPY (EGD) WITH PROPOFOL N/A  09/17/2020   Procedure: ESOPHAGOGASTRODUODENOSCOPY (EGD) WITH PROPOFOL;  Surgeon: Jonathon Bellows, MD;  Location: [facility] ENDOSCOPY;  Service: Gastroenterology;  Laterality: N/A;   Patient Active Problem List   Diagnosis Date Noted   Dysphagia 08/19/2020   Colon cancer screening 08/19/2020   URI with cough and congestion 06/12/2020   Suspected COVID-19 virus infection 06/12/2020   Bronchitis with asthma, acute 06/12/2020   Abnormal MRI, lumbar spine 04/19/2020   Cervicalgia 04/19/2020   Lumbar radiculopathy 04/19/2020   Vitamin D deficiency 01/26/2020   HLD (hyperlipidemia) 01/26/2020   RLS (restless legs syndrome) 01/01/2020   Mild intermittent asthma with acute exacerbation 01/01/2020   Frontal sinusitis 01/01/2020   Cigarette nicotine dependence without complication [id number]   Lumbar herniated disc 06/24/2019   Insomnia 06/24/2019   Female pelvic pain 06/24/2019   Diarrhea 03/05/2019   Nausea and vomiting 03/05/2019   COVID-19 virus detected 02/17/2019   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   Chronic sinusitis 04/04/2018   Allergic rhinitis 04/04/2018   Tobacco abuse 04/04/2018   Migraine without status migrainosus, not intractable 04/04/2018   Cardiac murmur 04/04/2018   Gastroesophageal reflux disease 04/04/2018   Prediabetes 04/04/2018   Fibroids 04/04/2018      Prior to Admission medications   Medication Sig Start Date End Date Taking?  Authorizing Provider  acetaminophen (TYLENOL) 500 MG tablet Take 1 tablet (500 mg total) by mouth every 4 (four) hours as needed. 03/05/19   McLean-Scocuzza, Nino Glow, MD  albuterol (VENTOLIN HFA) 108 (90 Base) MCG/ACT inhaler Inhale 1-2 puffs into the lungs every 4 (four) hours as needed for wheezing or shortness of breath. 01/01/20   McLean-Scocuzza, Nino Glow, MD  atorvastatin (LIPITOR) 10 MG tablet Take 1 tablet (10 mg total) by mouth daily. At night 02/02/20   McLean-Scocuzza, Nino Glow, MD  fluticasone (FLONASE) 50  MCG/ACT nasal spray Place 2 sprays into both nostrils daily. Prn max 2 sprays 04/04/18   McLean-Scocuzza, Nino Glow, MD  hydrOXYzine (ATARAX/VISTARIL) 25 MG tablet Take 1 tablet (25 mg total) by mouth every 8 (eight) hours as needed for itching. 09/13/20   Coral Spikes, DO  ibuprofen (ADVIL) 600 MG tablet Take 1 tablet (600 mg total) by mouth every 6 (six) hours as needed. 12/02/20   Melynda Ripple, MD  ipratropium-albuterol (DUONEB) 0.5-2.5 (3) MG/3ML SOLN Take 3 mLs by nebulization every 4 (four) hours as needed. 06/23/20   Faustino Congress, NP  loratadine (CLARITIN) 10 MG tablet Take 1 tablet (10 mg total) by mouth daily as needed for allergies. 01/01/20   McLean-Scocuzza, Nino Glow, MD  meclizine (ANTIVERT) 25 MG tablet Take 1 tablet (25 mg total) by mouth 3 (three) times daily as needed for dizziness. 10/20/19   Norval Gable, MD  montelukast (SINGULAIR) 10 MG tablet Take 1 tablet (10 mg total) by mouth at bedtime. 01/01/20   McLean-Scocuzza, Nino Glow, MD  omeprazole (PRILOSEC) 40 MG capsule Take 1 capsule (40 mg total) by mouth 2 (two) times daily. 10/11/20   Jonathon Bellows, MD  ondansetron (ZOFRAN ODT) 4 MG disintegrating tablet Take 1 tablet (4 mg total) by mouth every 8 (eight) hours as needed for nausea or vomiting. 12/02/20   Melynda Ripple, MD  predniSONE (DELTASONE) 10 MG tablet Take 6 tablets PO on the first day and decrease by 1 tablet daily until complete 12/27/20   Laurene Footman B, PA-C  tiZANidine (ZANAFLEX) 4 MG tablet Take 1 tablet (4 mg total) by mouth every 8 (eight) hours as needed for muscle spasms. 12/02/20   Melynda Ripple, MD  traZODone (DESYREL) 50 MG tablet Take 0.5-1 tablets (25-50 mg total) by mouth at bedtime as needed for sleep. 01/01/20   McLean-Scocuzza, Nino Glow, MD  triamcinolone ointment (KENALOG) 0.5 % Apply 1 application topically 2 (two) times daily. 09/13/20   Coral Spikes, DO  pantoprazole (PROTONIX) 40 MG tablet Take 1 tablet (40 mg total) by mouth daily. 30 min  before 04/09/20 08/30/20  McLean-Scocuzza, Nino Glow, MD    Allergies Shellfish allergy, Strawberry (diagnostic), and Tomato    Social History Social History   Tobacco Use   Smoking status: Some Days    Packs/day: 0.50    Types: Cigarettes   Smokeless tobacco: Never   Tobacco comments:    1 ppd   Vaping Use   Vaping Use: Never used  Substance Use Topics   Alcohol use: Yes    Comment: occassionally   Drug use: No    Review of Systems Patient denies headaches, rhinorrhea, blurry vision, numbness, shortness of breath, chest pain, edema, cough, abdominal pain, nausea, vomiting, diarrhea, dysuria, fevers, rashes or hallucinations unless otherwise stated above in HPI. ____________________________________________   PHYSICAL EXAM:  VITAL SIGNS: Vitals:   03/21/21 1156  BP: (!) 169/80  Pulse: 63  Resp: 18  Temp: 98.1 F (  36.7 C)  SpO2: 100%    Constitutional: Alert and oriented.  Eyes: Conjunctivae are normal.  Head: Atraumatic. Nose: No congestion/rhinnorhea. Mouth/Throat: Mucous membranes are moist.   Neck: No stridor. Painless ROM.  Cardiovascular: Normal rate, regular rhythm. Grossly normal heart sounds.  Good peripheral circulation. Respiratory: Normal respiratory effort.  No retractions. Lungs CTAB. Gastrointestinal: Soft and nontender. No distention. No abdominal bruits. No CVA tenderness. Genitourinary:  Musculoskeletal: No lower extremity tenderness nor edema.  No joint effusions. Neurologic:  Normal speech and language. No gross focal neurologic deficits are appreciated. No facial droop Skin:  Skin is warm, dry and intact. No rash noted. Psychiatric: Mood and affect are normal. Speech and behavior are normal.  ____________________________________________   LABS (all labs ordered are listed, but only abnormal results are displayed)  Results for orders placed or performed during the hospital encounter of 03/21/21 (from the past 24 hour(s))  CBC with  Differential     Status: None   Collection Time: 03/21/21 12:05 PM  Result Value Ref Range   WBC 7.4 4.0 - 10.5 K/uL   RBC 4.67 3.87 - 5.11 MIL/uL   Hemoglobin 14.4 12.0 - 15.0 g/dL   HCT 41.7 36.0 - 46.0 %   MCV 89.3 80.0 - 100.0 fL   MCH 30.8 26.0 - 34.0 pg   MCHC 34.5 30.0 - 36.0 g/dL   RDW 13.5 11.5 - 15.5 %   Platelets 324 150 - 400 K/uL   nRBC 0.0 0.0 - 0.2 %   Neutrophils Relative % 51 %   Neutro Abs 3.8 1.7 - 7.7 K/uL   Lymphocytes Relative 42 %   Lymphs Abs 3.1 0.7 - 4.0 K/uL   Monocytes Relative 4 %   Monocytes Absolute 0.3 0.1 - 1.0 K/uL   Eosinophils Relative 2 %   Eosinophils Absolute 0.2 0.0 - 0.5 K/uL   Basophils Relative 1 %   Basophils Absolute 0.1 0.0 - 0.1 K/uL   Immature Granulocytes 0 %   Abs Immature Granulocytes 0.02 0.00 - 0.07 K/uL  Basic metabolic panel     Status: Abnormal   Collection Time: 03/21/21 12:05 PM  Result Value Ref Range   Sodium 139 135 - 145 mmol/L   Potassium 3.9 3.5 - 5.1 mmol/L   Chloride 103 98 - 111 mmol/L   CO2 26 22 - 32 mmol/L   Glucose, Bld 110 (H) 70 - 99 mg/dL   BUN 10 6 - 20 mg/dL   Creatinine, Ser 0.74 0.44 - 1.00 mg/dL   Calcium 10.8 (H) 8.9 - 10.3 mg/dL   GFR, Estimated >60 >60 mL/min   Anion gap 10 5 - 15   ____________________________________________  EKG My review and personal interpretation at Time: 13:18   Indication: chest pain  Rate: 55  Rhythm: sinus Axis: normal Other: normal intervals, no stemi ____________________________________________  RADIOLOGY   ____________________________________________   PROCEDURES  Procedure(s) performed:  Procedures    Critical Care performed: no ____________________________________________   INITIAL IMPRESSION / ASSESSMENT AND PLAN / ED COURSE  Pertinent labs & imaging results that were available during my care of the patient were reviewed by me and considered in my medical decision making (see chart for details).   DDX: ACS, pericarditis, esophagitis,  boerhaaves, pe, dissection, pna, bronchitis, costochondritis   Teresa Deleon is a 55 y.o. who presents to the ED with symptoms as described above.  Somewhat positional in nature.  EKG nonischemic.  Troponin emergency urgent care was negative we will repeat [place].  She is low risk by Wells criteria will order D-dimer to further stratify.  Her abdominal exam is soft and benign.  Clinical Course as of 03/21/21 1416  Mon Mar 21, 2021  1415 Repeat Trope negative D-dimer negative.  Seems musculoskeletal as she it is positional in nature.  Review of records she had similar presentation in June which improved after prednisone and muscle relaxer.  This patient does appear stable and appropriate for outpatient follow-up.   [PR]    Clinical Course User Index [PR] Merlyn Lot, MD    The patient was evaluated in Emergency Department today for the symptoms described in the history of present illness. He/she was evaluated in the context of the global COVID-19 pandemic, which necessitated consideration that the patient might be at risk for infection with the SARS-CoV-2 virus that causes COVID-19. Institutional protocols and algorithms that pertain to the evaluation of patients at risk for COVID-19 are in a state of rapid change based on information released by regulatory bodies including the CDC and federal and state organizations. These policies and algorithms were followed during the patient's care in the ED.  As part of my medical decision making, I reviewed the following data within the [place] notes reviewed and incorporated, Labs reviewed, notes from prior ED visits and [place] Controlled Substance Database   ____________________________________________   FINAL CLINICAL IMPRESSION(S) / ED DIAGNOSES  Final diagnoses:  Left-sided chest pain      NEW MEDICATIONS STARTED DURING THIS VISIT:  New Prescriptions   No medications on file     Note:  This document was  prepared using Dragon voice recognition software and may include unintentional dictation errors.    Merlyn Lot, MD 03/21/21 [phone]

## 2021-03-21 NOTE — ED Triage Notes (Signed)
Pt c/o left sided upper back pain, and shortness of breath. She states the pain is worse with breathing, making her short of breath. Started this morning. Denies chest pain. Denies n/a vomiting, dizziness of sweats. She states she has a heart murmur but no other cardiac history. Denies injury.

## 2021-05-11 ENCOUNTER — Telehealth: Payer: Self-pay | Admitting: Internal Medicine

## 2021-05-11 DIAGNOSIS — J4521 Mild intermittent asthma with (acute) exacerbation: Secondary | ICD-10-CM

## 2021-05-11 DIAGNOSIS — J309 Allergic rhinitis, unspecified: Secondary | ICD-10-CM

## 2021-05-11 MED ORDER — ALBUTEROL SULFATE HFA 108 (90 BASE) MCG/ACT IN AERS: 1.0000 | INHALATION_SPRAY | RESPIRATORY_TRACT | 1 refills | Status: DC | PRN

## 2021-05-11 MED ORDER — MONTELUKAST SODIUM 10 MG PO TABS: 10.0000 mg | ORAL_TABLET | Freq: Every day | ORAL | 0 refills | Status: DC

## 2021-05-11 MED ORDER — FLUTICASONE PROPIONATE 50 MCG/ACT NA SUSP: 2.0000 | Freq: Every day | NASAL | 1 refills | Status: DC

## 2021-05-11 NOTE — Telephone Encounter (Signed)
Patient needs the following refills; traZODone (DESYREL) 50 MG tablet, montelukast (SINGULAIR) 10 MG tablet, fluticasone (FLONASE) 50 MCG/ACT nasal spray, albuterol (VENTOLIN HFA) 108 (90 Base) MCG/ACT inhaler

## 2021-05-11 NOTE — Telephone Encounter (Signed)
Patient no longer under Dr Olivia Mackie per provider.

## 2021-05-19 ENCOUNTER — Telehealth: Payer: Self-pay | Admitting: Physician Assistant

## 2021-05-19 DIAGNOSIS — B9689 Other specified bacterial agents as the cause of diseases classified elsewhere: Secondary | ICD-10-CM

## 2021-05-19 DIAGNOSIS — J019 Acute sinusitis, unspecified: Secondary | ICD-10-CM

## 2021-05-19 MED ORDER — ONDANSETRON 4 MG PO TBDP: 4.0000 mg | ORAL_TABLET | Freq: Three times a day (TID) | ORAL | 0 refills | Status: DC | PRN

## 2021-05-19 MED ORDER — AMOXICILLIN-POT CLAVULANATE 875-125 MG PO TABS: 1.0000 | ORAL_TABLET | Freq: Two times a day (BID) | ORAL | 0 refills | Status: DC

## 2021-05-19 NOTE — Progress Notes (Signed)
Virtual Visit Consent   Teresa Deleon, you are scheduled for a virtual visit with a [facility] provider today.     Just as with appointments in the office, your consent must be obtained to participate.  Your consent will be active for this visit and any virtual visit you may have with one of our providers in the next 365 days.     If you have a MyChart account, a copy of this consent can be sent to you electronically.  All virtual visits are billed to your insurance company just like a traditional visit in the office.    As this is a virtual visit, video technology does not allow for your provider to perform a traditional examination.  This may limit your provider's ability to fully assess your condition.  If your provider identifies any concerns that need to be evaluated in person or the need to arrange testing (such as labs, EKG, etc.), we will make arrangements to do so.     Although advances in technology are sophisticated, we cannot ensure that it will always work on either your end or our end.  If the connection with a video visit is poor, the visit may have to be switched to a telephone visit.  With either a video or telephone visit, we are not always able to ensure that we have a secure connection.     I need to obtain your verbal consent now.   Are you willing to proceed with your visit today?    [name] has provided verbal consent on 05/19/2021 for a virtual visit (video or telephone).   Teresa Deleon, [place]   Date: 05/19/2021 4:29 PM   Virtual Visit via Video Note   I, Teresa Deleon, connected with  Solei Wubben  ([id number], January 30, 1966) on 05/19/21 at  4:30 PM EDT by a video-enabled telemedicine application and verified that I am speaking with the correct person using two identifiers.  Location: Patient: Virtual Visit Location Patient: Home Provider: Virtual Visit Location Provider: Home Office   I discussed the limitations of evaluation and management by  telemedicine and the availability of in person appointments. The patient expressed understanding and agreed to proceed.    History of Present Illness: Teresa Deleon is a 55 y.o. who identifies as a female who was assigned female at birth, and is being seen today for headache x 3 weeks. Notes R frontal pressure and pain with mild headache. Denies fever, chills, aches. Also noted ear pressure and popping. Notes some nasal drainage. Denies cough or chest congestion. Is staying well-hydrated. Denies vision changes, confusion or dizziness. Denies ringing of ears. Is taking Aleve which helps but only for a little bit.   HPI: HPI  Problems: There are no problems to display for this patient.   Allergies:  Allergies  Allergen Reactions   Shellfish Allergy    Medications:  Current Outpatient Medications:    albuterol (2.5 MG/3ML) 0.083% NEBU 3 mL, albuterol (5 MG/ML) 0.5% NEBU 0.5 mL, Inhale into the lungs., Disp: , Rfl:    albuterol (VENTOLIN HFA) 108 (90 Base) MCG/ACT inhaler, Inhale into the lungs every 6 (six) hours as needed., Disp: , Rfl:    amoxicillin-clavulanate (AUGMENTIN) 875-125 MG tablet, Take 1 tablet by mouth 2 (two) times daily., Disp: 14 tablet, Rfl: 0   montelukast (SINGULAIR) 10 MG tablet, Take 10 mg by mouth at bedtime., Disp: , Rfl:    ondansetron (ZOFRAN ODT) 4 MG disintegrating tablet, Take 1 tablet (4  mg total) by mouth every 8 (eight) hours as needed for nausea or vomiting., Disp: 20 tablet, Rfl: 0  Observations/Objective: Patient is well-developed, well-nourished in no acute distress.  Resting comfortably at home.  Head is normocephalic, atraumatic.  No labored breathing. Speech is clear and coherent with logical content.  Patient is alert and oriented at baseline.   Assessment and Plan: 1. Acute bacterial sinusitis - ondansetron (ZOFRAN ODT) 4 MG disintegrating tablet; Take 1 tablet (4 mg total) by mouth every 8 (eight) hours as needed for nausea or vomiting.   Dispense: 20 tablet; Refill: 0 - amoxicillin-clavulanate (AUGMENTIN) 875-125 MG tablet; Take 1 tablet by mouth 2 (two) times daily.  Dispense: 14 tablet; Refill: 0 Rx Augmentin.  Increase fluids.  Rest.  Saline nasal spray.  Probiotic.  Mucinex as directed.  Humidifier in bedroom. Restart Flonase. Will send in Zofran 4 mg ODT to use as directed if needed for nausea from drainage.  Call or return to clinic if symptoms are not improving.   Follow Up Instructions: I discussed the assessment and treatment plan with the patient. The patient was provided an opportunity to ask questions and all were answered. The patient agreed with the plan and demonstrated an understanding of the instructions.  A copy of instructions were sent to the patient via MyChart unless otherwise noted below.   The patient was advised to call back or seek an in-person evaluation if the symptoms worsen or if the condition fails to improve as anticipated.  Time:  I spent 10 minutes with the patient via telehealth technology discussing the above problems/concerns.    Teresa Rio, PA-C

## 2021-05-19 NOTE — Patient Instructions (Signed)
Naida Sleight, thank you for joining [name], PA-C for today's virtual visit.  While this provider is not your primary care provider (PCP), if your PCP is located in our provider database this encounter information will be shared with them immediately following your visit.  Consent: (Patient) Teresa Deleon provided verbal consent for this virtual visit at the beginning of the encounter.  Current Medications: No current outpatient medications on file.   Medications ordered in this encounter:  No orders of the defined types were placed in this encounter.    *If you need refills on other medications prior to your next appointment, please contact your pharmacy*  Follow-Up: Call back or seek an in-person evaluation if the symptoms worsen or if the condition fails to improve as anticipated.  Other Instructions Please take antibiotic as directed.  Increase fluid intake.  Use Saline nasal spray.  Take a daily multivitamin. Restart the Flonase.  Place a humidifier in the bedroom.  Please call or return clinic if symptoms are not improving.  Sinusitis Sinusitis is redness, soreness, and swelling (inflammation) of the paranasal sinuses. Paranasal sinuses are air pockets within the bones of your face (beneath the eyes, the middle of the forehead, or above the eyes). In healthy paranasal sinuses, mucus is able to drain out, and air is able to circulate through them by way of your nose. However, when your paranasal sinuses are inflamed, mucus and air can become trapped. This can allow bacteria and other germs to grow and cause infection. Sinusitis can develop quickly and last only a short time (acute) or continue over a long period (chronic). Sinusitis that lasts for more than 12 weeks is considered chronic.  CAUSES  Causes of sinusitis include: Allergies. Structural abnormalities, such as displacement of the cartilage that separates your nostrils (deviated septum), which can decrease the  air flow through your nose and sinuses and affect sinus drainage. Functional abnormalities, such as when the small hairs (cilia) that line your sinuses and help remove mucus do not work properly or are not present. SYMPTOMS  Symptoms of acute and chronic sinusitis are the same. The primary symptoms are pain and pressure around the affected sinuses. Other symptoms include: Upper toothache. Earache. Headache. Bad breath. Decreased sense of smell and taste. A cough, which worsens when you are lying flat. Fatigue. Fever. Thick drainage from your nose, which often is green and may contain pus (purulent). Swelling and warmth over the affected sinuses. DIAGNOSIS  Your caregiver will perform a physical exam. During the exam, your caregiver may: Look in your nose for signs of abnormal growths in your nostrils (nasal polyps). Tap over the affected sinus to check for signs of infection. View the inside of your sinuses (endoscopy) with a special imaging device with a light attached (endoscope), which is inserted into your sinuses. If your caregiver suspects that you have chronic sinusitis, one or more of the following tests may be recommended: Allergy tests. Nasal culture A sample of mucus is taken from your nose and sent to a lab and screened for bacteria. Nasal cytology A sample of mucus is taken from your nose and examined by your caregiver to determine if your sinusitis is related to an allergy. TREATMENT  Most cases of acute sinusitis are related to a viral infection and will resolve on their own within 10 days. Sometimes medicines are prescribed to help relieve symptoms (pain medicine, decongestants, nasal steroid sprays, or saline sprays).  However, for sinusitis related to a bacterial infection, your  caregiver will prescribe antibiotic medicines. These are medicines that will help kill the bacteria causing the infection.  Rarely, sinusitis is caused by a fungal infection. In theses cases, your  caregiver will prescribe antifungal medicine. For some cases of chronic sinusitis, surgery is needed. Generally, these are cases in which sinusitis recurs more than 3 times per year, despite other treatments. HOME CARE INSTRUCTIONS  Drink plenty of water. Water helps thin the mucus so your sinuses can drain more easily. Use a humidifier. Inhale steam 3 to 4 times a day (for example, sit in the bathroom with the shower running). Apply a warm, moist washcloth to your face 3 to 4 times a day, or as directed by your caregiver. Use saline nasal sprays to help moisten and clean your sinuses. Take over-the-counter or prescription medicines for pain, discomfort, or fever only as directed by your caregiver. SEEK IMMEDIATE MEDICAL CARE IF: You have increasing pain or severe headaches. You have nausea, vomiting, or drowsiness. You have swelling around your face. You have vision problems. You have a stiff neck. You have difficulty breathing. MAKE SURE YOU:  Understand these instructions. Will watch your condition. Will get help right away if you are not doing well or get worse. Document Released: 07/10/2005 Document Revised: 10/02/2011 Document Reviewed: 07/25/2011 [facility] Patient Information 2014 [place], [place].    If you have been instructed to have an in-person evaluation today at a local Urgent Care facility, please use the link below. It will take you to a list of all of our available [facility], including address, phone number and hours of operation. Please do not delay care.  [facility]  If you or a family member do not have a primary care provider, use the link below to schedule a visit and establish care. When you choose a [facility] primary care physician or advanced practice provider, you gain a long-term partner in health. Find a Primary Care Provider  Learn more about [facility]'s in-office and virtual care options: [facility]

## 2021-06-23 DIAGNOSIS — U071 COVID-19: Secondary | ICD-10-CM

## 2021-06-23 HISTORY — DX: COVID-19: U07.1

## 2021-06-30 IMAGING — CR DG LUMBAR SPINE 2-3V
3 series · 3 of 3 positions shown · non-contrast
Comparison: February 15, 2017

CLINICAL DATA: Lower back pain after injury 5 days ago

EXAM:
LUMBAR SPINE - 2-3 VIEW

[l-spine ap]
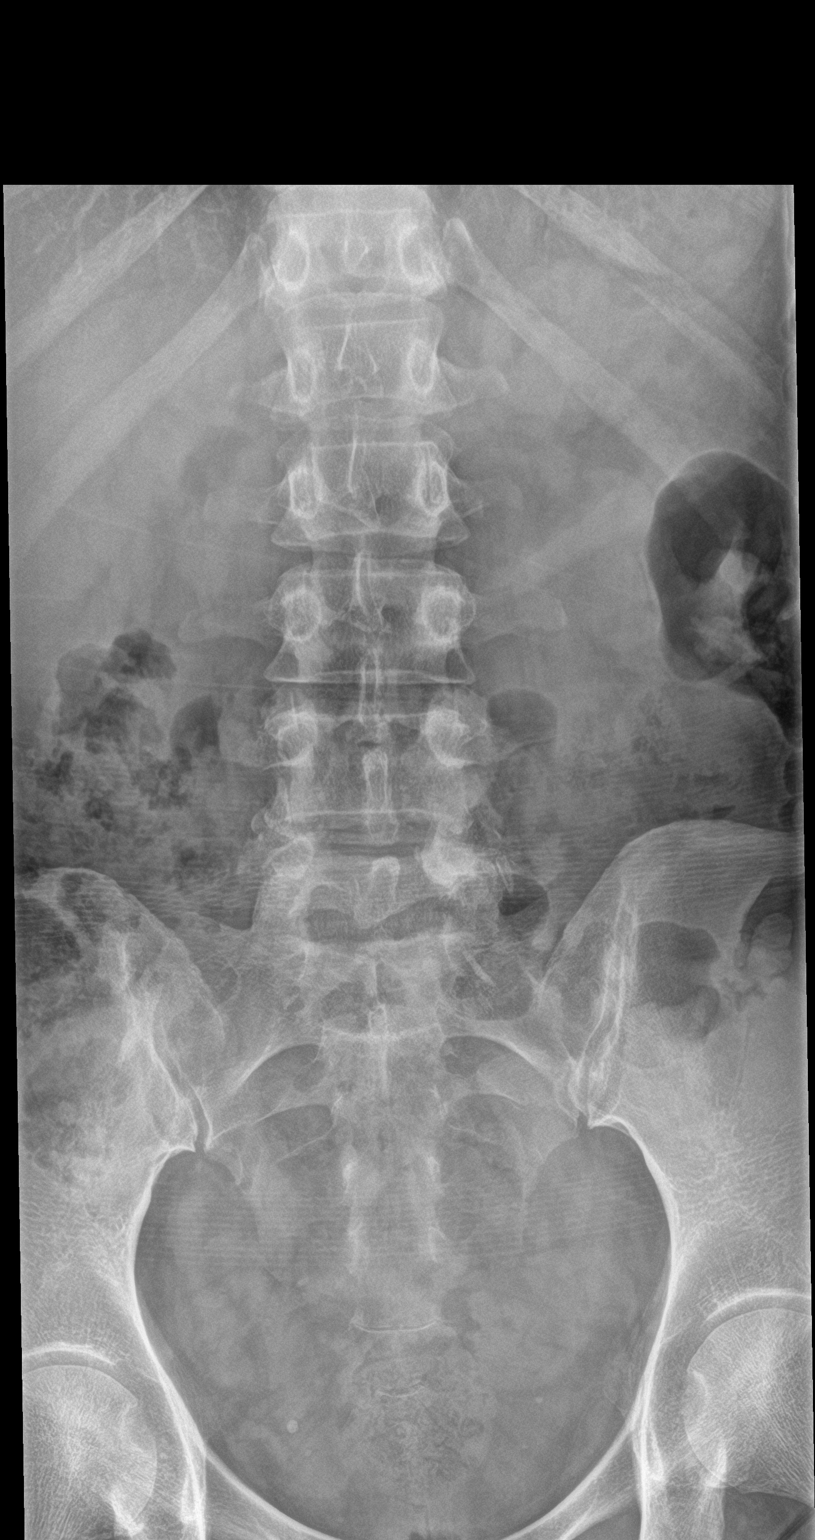

[l-spine lat (1 of 2)]
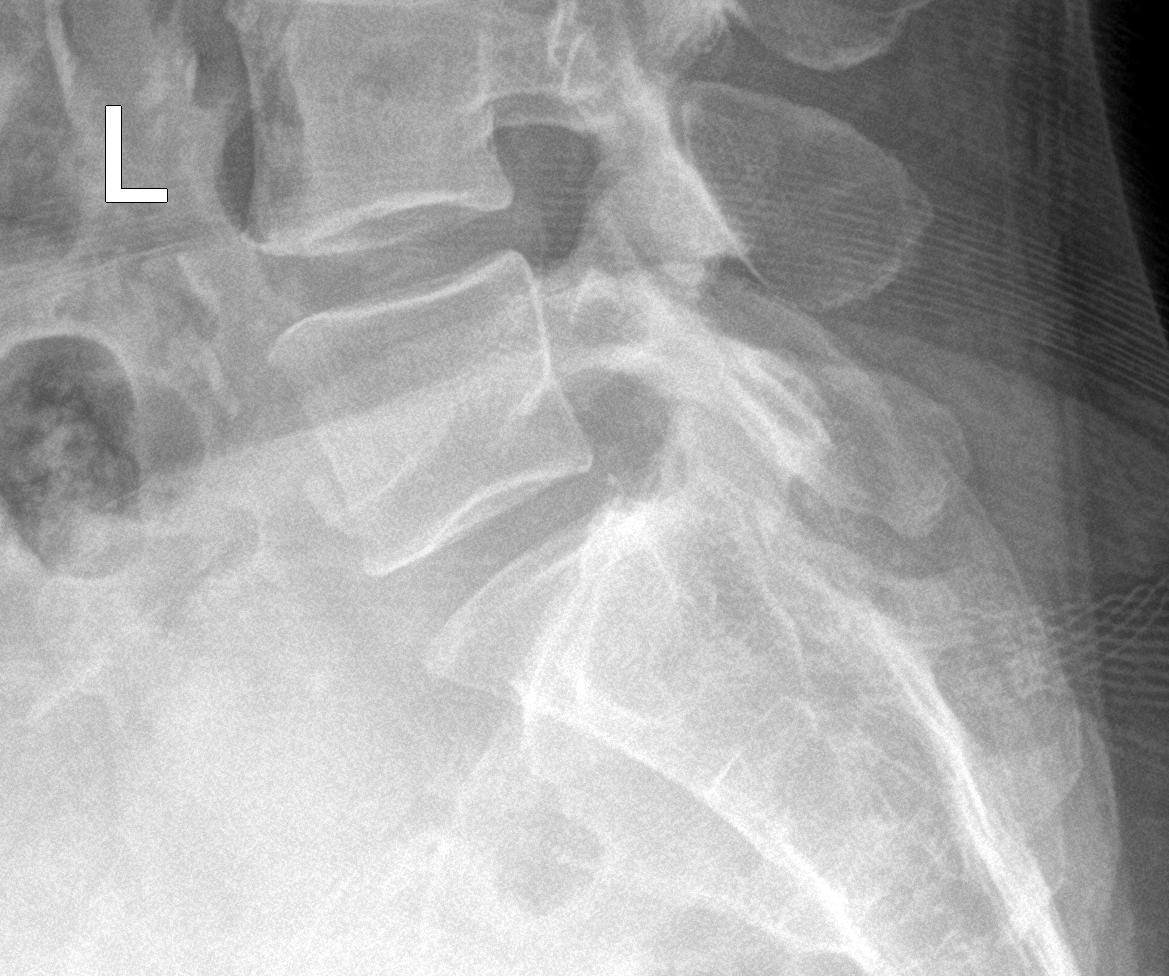

[l-spine lat (2 of 2)]
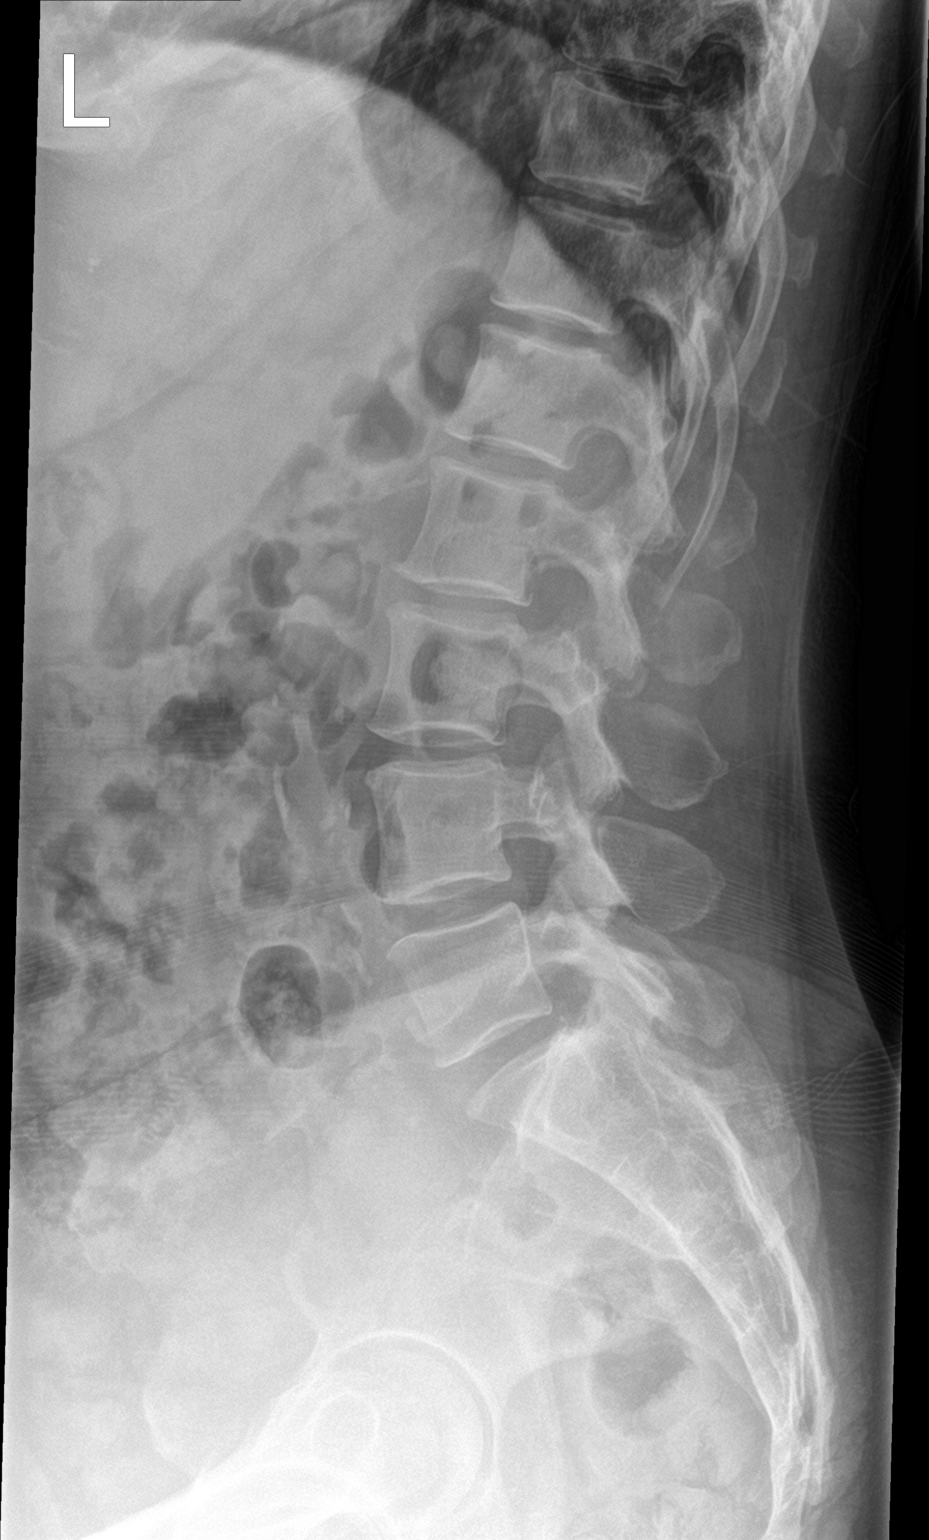

[3 of 3 positions shown; findings below may reference images not displayed]

FINDINGS: There is no evidence of lumbar spine fracture. Alignment is normal.
Intervertebral disc spaces are maintained. Scattered vascular
calcifications.
IMPRESSION: Negative.

## 2021-07-06 ENCOUNTER — Other Ambulatory Visit: Payer: Self-pay

## 2021-07-06 ENCOUNTER — Telehealth: Payer: Self-pay | Admitting: Physician Assistant

## 2021-07-06 ENCOUNTER — Ambulatory Visit
Admission: EM | Admit: 2021-07-06 | Discharge: 2021-07-06 | Disposition: A | Discharge status: Home / Self Care | Payer: No Typology Code available for payment source | Attending: Physician Assistant | Admitting: Physician Assistant

## 2021-07-06 DIAGNOSIS — U071 COVID-19: Secondary | ICD-10-CM | POA: Diagnosis not present

## 2021-07-06 DIAGNOSIS — R051 Acute cough: Secondary | ICD-10-CM | POA: Diagnosis present

## 2021-07-06 DIAGNOSIS — B349 Viral infection, unspecified: Secondary | ICD-10-CM | POA: Diagnosis not present

## 2021-07-06 DIAGNOSIS — G8929 Other chronic pain: Secondary | ICD-10-CM

## 2021-07-06 DIAGNOSIS — M545 Low back pain, unspecified: Secondary | ICD-10-CM

## 2021-07-06 DIAGNOSIS — R52 Pain, unspecified: Secondary | ICD-10-CM | POA: Diagnosis not present

## 2021-07-06 LAB — RESP PANEL BY RT-PCR (FLU A&B, COVID) ARPGX2
Influenza A by PCR: NEGATIVE
Influenza B by PCR: NEGATIVE
SARS Coronavirus 2 by RT PCR: POSITIVE — AB

## 2021-07-06 MED ORDER — PSEUDOEPH-BROMPHEN-DM 30-2-10 MG/5ML PO SYRP: 10.0000 mL | ORAL_SOLUTION | Freq: Four times a day (QID) | ORAL | 0 refills | Status: AC | PRN

## 2021-07-06 MED ORDER — MOLNUPIRAVIR 200 MG PO CAPS: 4.0000 | ORAL_CAPSULE | Freq: Two times a day (BID) | ORAL | 0 refills | Status: AC

## 2021-07-06 MED ORDER — KETOROLAC TROMETHAMINE 60 MG/2ML IM SOLN
60.0000 mg | Freq: Once | INTRAMUSCULAR | Status: AC
Start: 2021-07-06 — End: 2021-07-06
  Administered 2021-07-06: 12:00:00 60 mg via INTRAMUSCULAR

## 2021-07-06 NOTE — Discharge Instructions (Signed)

## 2021-07-06 NOTE — Telephone Encounter (Signed)
Patient is positive for COVID-19 on today's test.  Notified patient of this and sent antiviral medicine to pharmacy.  I again reviewed current CDC guidelines, isolation protocol and ED precautions.

## 2021-07-06 NOTE — ED Provider Notes (Signed)
[facility]    CSN: [id number] Arrival date & time: 07/06/21  0949      History   Chief Complaint Chief Complaint  Patient presents with   Flu like Symptoms    HPI Teresa Deleon is a 55 y.o. female presenting for onset of low-grade fever, fatigue, body aches and especially lower back pain, cough, chills, congestion and headache yesterday.  Patient reports that she has 2 different jobs.  She does work in [employer].  States that her second there have been a lot of people coughing and not masking.  Unsure of any flu or COVID exposure.  Patient says she feels like she has the flu.  Reports having COVID-19 in 2019 and says she feels worse now than she did then.  She denies any breathing difficulty.  Vomiting x2.  No diarrhea.  Patient says she has a history of chronic back pain and herniated disks in her back.  She asks for a injection to help with the pain which seems to be worse since she has been coughing and developed the sickness.  Reports she has had some improvement in her symptoms with Toradol in the past and would like to have that today.  No other complaints.  HPI  Past Medical History:  Diagnosis Date   Asthma    Back pain    Chronic sinusitis    GERD (gastroesophageal reflux disease)    Heart murmur    Leaky heart valve    Migraine    Migraine    Trichomonas infection     Patient Active Problem List   Diagnosis Date Noted   Dysphagia 08/19/2020   Colon cancer screening 08/19/2020   URI with cough and congestion 06/12/2020   Suspected COVID-19 virus infection 06/12/2020   Bronchitis with asthma, acute 06/12/2020   Abnormal MRI, lumbar spine 04/19/2020   Cervicalgia 04/19/2020   Lumbar radiculopathy 04/19/2020   Vitamin D deficiency 01/26/2020   HLD (hyperlipidemia) 01/26/2020   RLS (restless legs syndrome) 01/01/2020   Mild intermittent asthma with acute exacerbation 01/01/2020   Frontal sinusitis 01/01/2020   Cigarette nicotine dependence  without complication 67/06/4579   Lumbar herniated disc 06/24/2019   Insomnia 06/24/2019   Female pelvic pain 06/24/2019   Diarrhea 03/05/2019   Nausea and vomiting 03/05/2019   COVID-19 virus detected 02/17/2019   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   Chronic sinusitis 04/04/2018   Allergic rhinitis 04/04/2018   Tobacco abuse 04/04/2018   Migraine without status migrainosus, not intractable 04/04/2018   Cardiac murmur 04/04/2018   Gastroesophageal reflux disease 04/04/2018   Prediabetes 04/04/2018   Fibroids 04/04/2018    Past Surgical History:  Procedure Laterality Date   BREAST SURGERY     left breast abcess   COLONOSCOPY WITH PROPOFOL N/A 09/17/2020   Procedure: COLONOSCOPY WITH PROPOFOL;  Surgeon: Jonathon Bellows, MD;  Location: [facility] ENDOSCOPY;  Service: Gastroenterology;  Laterality: N/A;   ESOPHAGOGASTRODUODENOSCOPY (EGD) WITH PROPOFOL N/A 09/17/2020   Procedure: ESOPHAGOGASTRODUODENOSCOPY (EGD) WITH PROPOFOL;  Surgeon: Jonathon Bellows, MD;  Location: [facility] ENDOSCOPY;  Service: Gastroenterology;  Laterality: N/A;    OB History   No obstetric history on file.      Home Medications    Prior to Admission medications   Medication Sig Start Date End Date Taking? Authorizing Provider  acetaminophen (TYLENOL) 500 MG tablet Take 1 tablet (500 mg total) by mouth every 4 (four) hours as needed. 03/05/19  Yes McLean-Scocuzza, Nino Glow, MD  albuterol (2.5  MG/3ML) 0.083% NEBU 3 mL, albuterol (5 MG/ML) 0.5% NEBU 0.5 mL Inhale into the lungs.   Yes [provider]  albuterol (VENTOLIN HFA) 108 (90 Base) MCG/ACT inhaler Inhale 1-2 puffs into the lungs every 4 (four) hours as needed for wheezing or shortness of breath. 05/11/21  Yes McLean-Scocuzza, Nino Glow, MD  atorvastatin (LIPITOR) 10 MG tablet Take 1 tablet (10 mg total) by mouth daily. At night 02/02/20  Yes McLean-Scocuzza, Nino Glow, MD  brompheniramine-pseudoephedrine-DM 30-2-10 MG/5ML syrup Take 10 mLs  by mouth 4 (four) times daily as needed for up to 7 days. 07/06/21 07/13/21 Yes Laurene Footman B, PA-C  ibuprofen (ADVIL) 600 MG tablet Take 1 tablet (600 mg total) by mouth every 6 (six) hours as needed. 12/02/20  Yes Melynda Ripple, MD  ipratropium-albuterol (DUONEB) 0.5-2.5 (3) MG/3ML SOLN Take 3 mLs by nebulization every 4 (four) hours as needed. 06/23/20  Yes Faustino Congress, NP  montelukast (SINGULAIR) 10 MG tablet Take 1 tablet (10 mg total) by mouth at bedtime. 05/11/21  Yes McLean-Scocuzza, Nino Glow, MD  montelukast (SINGULAIR) 10 MG tablet Take 10 mg by mouth at bedtime.   Yes [provider]  omeprazole (PRILOSEC) 40 MG capsule Take 1 capsule (40 mg total) by mouth 2 (two) times daily. 10/11/20  Yes Jonathon Bellows, MD  ondansetron (ZOFRAN ODT) 4 MG disintegrating tablet Take 1 tablet (4 mg total) by mouth every 8 (eight) hours as needed for nausea or vomiting. 12/02/20  Yes Melynda Ripple, MD  tiZANidine (ZANAFLEX) 4 MG tablet Take 1 tablet (4 mg total) by mouth every 8 (eight) hours as needed for muscle spasms. 12/02/20  Yes Melynda Ripple, MD  traZODone (DESYREL) 50 MG tablet Take 0.5-1 tablets (25-50 mg total) by mouth at bedtime as needed for sleep. 01/01/20  Yes McLean-Scocuzza, Nino Glow, MD  albuterol (VENTOLIN HFA) 108 (90 Base) MCG/ACT inhaler Inhale into the lungs every 6 (six) hours as needed.    [provider]  cyclobenzaprine (FLEXERIL) 10 MG tablet Take 1 tablet (10 mg total) by mouth 3 (three) times daily as needed for muscle spasms. 03/21/21   Merlyn Lot, MD  fluticasone [facility]) 50 MCG/ACT nasal spray Place 2 sprays into both nostrils daily. Prn max 2 sprays 05/11/21   McLean-Scocuzza, Nino Glow, MD  hydrOXYzine (ATARAX/VISTARIL) 25 MG tablet Take 1 tablet (25 mg total) by mouth every 8 (eight) hours as needed for itching. 09/13/20   Coral Spikes, DO  loratadine (CLARITIN) 10 MG tablet Take 1 tablet (10 mg total) by mouth daily as needed for allergies.  01/01/20   McLean-Scocuzza, Nino Glow, MD  meclizine (ANTIVERT) 25 MG tablet Take 1 tablet (25 mg total) by mouth 3 (three) times daily as needed for dizziness. 10/20/19   Norval Gable, MD  ondansetron (ZOFRAN ODT) 4 MG disintegrating tablet Take 1 tablet (4 mg total) by mouth every 8 (eight) hours as needed for nausea or vomiting. 05/19/21   Brunetta Jeans, PA-C  triamcinolone ointment (KENALOG) 0.5 % Apply 1 application topically 2 (two) times daily. 09/13/20   Coral Spikes, DO  pantoprazole (PROTONIX) 40 MG tablet Take 1 tablet (40 mg total) by mouth daily. 30 min before 04/09/20 08/30/20  McLean-Scocuzza, Nino Glow, MD    Family History Family History  Problem Relation Age of Onset   Asthma Mother    COPD Mother    Diabetes Mother    Miscarriages / [place] Mother    Diabetes Father    Heart disease Father  Hypertension Father    Heart failure Father    Heart attack Father    Asthma Daughter    Miscarriages / [place] Daughter    Arthritis Maternal Grandmother    Asthma Maternal Grandmother    Hyperlipidemia Maternal Grandmother    Diabetes Maternal Grandfather    Stroke Paternal Grandmother    Heart disease Brother     Social History Social History   Tobacco Use   Smoking status: Some Days    Packs/day: 0.50    Types: Cigarettes   Smokeless tobacco: Never   Tobacco comments:    1 ppd   Vaping Use   Vaping Use: Never used  Substance Use Topics   Alcohol use: Yes    Comment: occassionally   Drug use: No     Allergies   Shellfish allergy, Strawberry (diagnostic), and Tomato   Review of Systems Review of Systems  Constitutional:  Positive for chills, fatigue and fever. Negative for diaphoresis.  HENT:  Positive for congestion and rhinorrhea. Negative for ear pain, sinus pressure, sinus pain and sore throat.   Respiratory:  Positive for cough. Negative for shortness of breath.   Cardiovascular:  Negative for chest pain.  Gastrointestinal:  Positive for  nausea and vomiting. Negative for abdominal pain.  Musculoskeletal:  Positive for back pain and myalgias. Negative for arthralgias.  Skin:  Negative for rash.  Neurological:  Positive for headaches. Negative for weakness.  Hematological:  Negative for adenopathy.    Physical Exam Triage Vital Signs ED Triage Vitals  Enc Vitals Group     BP 07/06/21 1058 110/65     Pulse Rate 07/06/21 1058 83     Resp 07/06/21 1102 18     Temp 07/06/21 1058 98.6 F (37 C)     Temp Source 07/06/21 1058 Oral     SpO2 07/06/21 1058 98 %     Weight 07/06/21 1053 147 lb (66.7 kg)     Height --      Head Circumference --      Peak Flow --      Pain Score 07/06/21 1053 10     Pain Loc --      Pain Edu? --      Excl. in [place]? --    No data found.  Updated Vital Signs BP 110/65 (BP Location: Left Arm)    Pulse 83    Temp 98.6 F (37 C) (Oral)    Resp 18    Wt 147 lb (66.7 kg)    SpO2 98%    BMI 27.78 kg/m      Physical Exam Vitals and nursing note reviewed.  Constitutional:      General: She is not in acute distress.    Appearance: Normal appearance. She is ill-appearing. She is not toxic-appearing.  HENT:     Head: Normocephalic and atraumatic.     Nose: Congestion present.     Mouth/Throat:     Mouth: Mucous membranes are moist.     Pharynx: Oropharynx is clear.  Eyes:     General: No scleral icterus.       Right eye: No discharge.        Left eye: No discharge.     Conjunctiva/sclera: Conjunctivae normal.  Cardiovascular:     Rate and Rhythm: Normal rate and regular rhythm.     Heart sounds: Normal heart sounds.  Pulmonary:     Effort: Pulmonary effort is normal. No respiratory distress.     Breath sounds:  Normal breath sounds. No wheezing, rhonchi or rales.  Musculoskeletal:     Cervical back: Neck supple.  Skin:    General: Skin is dry.  Neurological:     General: No focal deficit present.     Mental Status: She is alert. Mental status is at baseline.     Motor: No weakness.      Gait: Gait normal.  Psychiatric:        Mood and Affect: Mood normal.        Behavior: Behavior normal.        Thought Content: Thought content normal.     UC Treatments / Results  Labs (all labs ordered are listed, but only abnormal results are displayed) Labs Reviewed  RESP PANEL BY RT-PCR (FLU A&B, COVID) [id number]    EKG   Radiology No results found.  Procedures Procedures (including critical care time)  Medications Ordered in UC Medications  ketorolac (TORADOL) injection 60 mg (60 mg Intramuscular Given 07/06/21 1135)    Initial Impression / Assessment and Plan / UC Course  I have reviewed the triage vital signs and the nursing notes.  Pertinent labs & imaging results that were available during my care of the patient were reviewed by me and considered in my medical decision making (see chart for details).  55 year old female presenting for onset of low-grade fever, fatigue, body aches, headache, low back pain, cough, chills, congestion starting yesterday.  Vitals are all stable today.  She is ill-appearing, wrapped up in a coat and complaining of chills.  Exam does reveal nasal congestion.  She also has tenderness palpation of her lumbar back.  The remainder the exam is normal.  Respiratory panel obtained to assess for influenza and COVID-19.  Patient advised that we will contact her with results.  Patient given ketorolac in clinic for back pain and body aches.  In the meantime I have sent Bromfed-DM to pharmacy.  Advised increasing rest and fluids.  Positive COVID.  Culture discussed results with patient.  Sent Molnupiravir to pharmacy and again reviewed CDC guidelines, isolation protocol and ED precautions.  Final Clinical Impressions(s) / UC Diagnoses   Final diagnoses:  Viral illness  Body aches  Chronic bilateral low back pain, unspecified whether sciatica present  Acute cough     Discharge Instructions      URI/COLD SYMPTOMS: Your exam today is  consistent with a viral illness. Antibiotics are not indicated at this time. Use medications as directed, including cough syrup, nasal saline, and decongestants. Your symptoms should improve over the next few days and resolve within 7-10 days. Increase rest and fluids. F/u if symptoms worsen or predominate such as sore throat, ear pain, productive cough, shortness of breath, or if you develop high fevers or worsening fatigue over the next several days.    You have received COVID testing today either for positive exposure, concerning symptoms that could be related to COVID infection, screening purposes, or re-testing after confirmed positive.  Your test obtained today checks for active viral infection in the last 1-2 weeks. If your test is negative now, you can still test positive later. So, if you do develop symptoms you should either get re-tested and/or isolate x 5 days and then strict mask use x 5 days (unvaccinated) or mask use x 10 days (vaccinated). Please follow CDC guidelines.  While Rapid antigen tests come back in 15-20 minutes, send out PCR/molecular test results typically come back within 1-3 days. In the mean time, if you are symptomatic,  assume this could be a positive test and treat/monitor yourself as if you do have COVID.   We will call with test results if positive. Please download the MyChart app and set up a profile to access test results.   If symptomatic, go home and rest. Push fluids. Take Tylenol as needed for discomfort. Gargle warm salt water. Throat lozenges. Take Mucinex DM or Robitussin for cough. Humidifier in bedroom to ease coughing. Warm showers. Also review the COVID handout for more information.  COVID-19 INFECTION: The incubation period of COVID-19 is approximately 14 days after exposure, with most symptoms developing in roughly 4-5 days. Symptoms may range in severity from mild to critically severe. Roughly 80% of those infected will have mild symptoms. People of any  age may become infected with COVID-19 and have the ability to transmit the virus. The most common symptoms include: fever, fatigue, cough, body aches, headaches, sore throat, nasal congestion, shortness of breath, nausea, vomiting, diarrhea, changes in smell and/or taste.    COURSE OF ILLNESS Some patients may begin with mild disease which can progress quickly into critical symptoms. If your symptoms are worsening please call ahead to the Emergency Department and proceed there for further treatment. Recovery time appears to be roughly 1-2 weeks for mild symptoms and 3-6 weeks for severe disease.   GO IMMEDIATELY TO ER FOR FEVER YOU ARE UNABLE TO GET DOWN WITH TYLENOL, BREATHING PROBLEMS, CHEST PAIN, FATIGUE, LETHARGY, INABILITY TO EAT OR DRINK, ETC  QUARANTINE AND ISOLATION: To help decrease the spread of COVID-19 please remain isolated if you have COVID infection or are highly suspected to have COVID infection. This means -stay home and isolate to one room in the home if you live with others. Do not share a bed or bathroom with others while ill, sanitize and wipe down all countertops and keep common areas clean and disinfected. Stay home for 5 days. If you have no symptoms or your symptoms are resolving after 5 days, you can leave your house. Continue to wear a mask around others for 5 additional days. If you have been in close contact (within 6 feet) of someone diagnosed with COVID 19, you are advised to quarantine in your home for 14 days as symptoms can develop anywhere from 2-14 days after exposure to the virus. If you develop symptoms, you  must isolate.  Most current guidelines for COVID after exposure -unvaccinated: isolate 5 days and strict mask use x 5 days. Test on day 5 is possible -vaccinated: wear mask x 10 days if symptoms do not develop -You do not necessarily need to be tested for COVID if you have + exposure and  develop symptoms. Just isolate at home x10 days from symptom onset During  this global pandemic, CDC advises to practice social distancing, try to stay at least 25ft away from others at all times. Wear a face covering. Wash and sanitize your hands regularly and avoid going anywhere that is not necessary.  KEEP IN MIND THAT THE COVID TEST IS NOT 100% ACCURATE AND YOU SHOULD STILL DO EVERYTHING TO PREVENT POTENTIAL SPREAD OF VIRUS TO OTHERS (WEAR MASK, WEAR GLOVES, [place] HANDS AND SANITIZE REGULARLY). IF INITIAL TEST IS NEGATIVE, THIS MAY NOT MEAN YOU ARE DEFINITELY NEGATIVE. MOST ACCURATE TESTING IS DONE 5-7 DAYS AFTER EXPOSURE.   It is not advised by CDC to get re-tested after receiving a positive COVID test since you can still test positive for weeks to months after you have already cleared the virus.   *  If you have not been vaccinated for COVID, I strongly suggest you consider getting vaccinated as long as there are no contraindications.       ED Prescriptions     Medication Sig Dispense Auth. Provider   brompheniramine-pseudoephedrine-DM 30-2-10 MG/5ML syrup Take 10 mLs by mouth 4 (four) times daily as needed for up to 7 days. 150 mL Danton Clap, PA-C      PDMP not reviewed this encounter.   Danton Clap, PA-C 07/06/21 1408

## 2021-07-06 NOTE — ED Triage Notes (Signed)
Patient is here with "Flu like symptoms" that started "yesterday around 12 noon". Body aches, Vomiting "x2", last was around 5am, ha, chills "can't get warm". No fever. 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 Flu vaccine not done. COVID19 vaccine done. No COVID19 testing at home.

## 2021-07-13 ENCOUNTER — Ambulatory Visit (INDEPENDENT_AMBULATORY_CARE_PROVIDER_SITE_OTHER): Payer: No Typology Code available for payment source

## 2021-07-13 ENCOUNTER — Other Ambulatory Visit: Payer: Self-pay

## 2021-07-13 ENCOUNTER — Ambulatory Visit
Admission: EM | Admit: 2021-07-13 | Discharge: 2021-07-13 | Disposition: A | Discharge status: Home / Self Care | Payer: No Typology Code available for payment source | Attending: Emergency Medicine | Admitting: Emergency Medicine

## 2021-07-13 DIAGNOSIS — M62830 Muscle spasm of back: Secondary | ICD-10-CM

## 2021-07-13 DIAGNOSIS — R079 Chest pain, unspecified: Secondary | ICD-10-CM

## 2021-07-13 DIAGNOSIS — S29019A Strain of muscle and tendon of unspecified wall of thorax, initial encounter: Secondary | ICD-10-CM

## 2021-07-13 MED ORDER — IBUPROFEN 600 MG PO TABS: 600.0000 mg | ORAL_TABLET | Freq: Four times a day (QID) | ORAL | 0 refills | Status: DC | PRN

## 2021-07-13 MED ORDER — BACLOFEN 10 MG PO TABS: 10.0000 mg | ORAL_TABLET | Freq: Three times a day (TID) | ORAL | 0 refills | Status: DC

## 2021-07-13 NOTE — ED Triage Notes (Signed)
Pt here with C/O spasm feeling in upper back, hurts to breath.

## 2021-07-13 NOTE — ED Provider Notes (Signed)
[facility]    CSN: [id number] Arrival date & time: 07/13/21  1819      History   Chief Complaint Chief Complaint  Patient presents with   Covid Positive    HPI Teresa Deleon is a 55 y.o. female.   HPI  55 year old female here for evaluation of back pain.  Patient reports that she returned to work yesterday after being out with COVID and was placed in a new department where she is doing lifting and physical labor that she is not used to doing.  Today she noticed spasm in her mid back that is greater on the left than the right.  She has been coughing and has had continued shortness breath and wheezing.  She states that this pain feels Monika spasm and is different from the pain you get if you been coughing too much.  She denies any numbness, tingling, or weakness in any of her extremities.  Past Medical History:  Diagnosis Date   Asthma    Back pain    Chronic sinusitis    GERD (gastroesophageal reflux disease)    Heart murmur    Leaky heart valve    Migraine    Migraine    Trichomonas infection     Patient Active Problem List   Diagnosis Date Noted   Dysphagia 08/19/2020   Colon cancer screening 08/19/2020   URI with cough and congestion 06/12/2020   Suspected COVID-19 virus infection 06/12/2020   Bronchitis with asthma, acute 06/12/2020   Abnormal MRI, lumbar spine 04/19/2020   Cervicalgia 04/19/2020   Lumbar radiculopathy 04/19/2020   Vitamin D deficiency 01/26/2020   HLD (hyperlipidemia) 01/26/2020   RLS (restless legs syndrome) 01/01/2020   Mild intermittent asthma with acute exacerbation 01/01/2020   Frontal sinusitis 01/01/2020   Cigarette nicotine dependence without complication 17/40/8144   Lumbar herniated disc 06/24/2019   Insomnia 06/24/2019   Female pelvic pain 06/24/2019   Diarrhea 03/05/2019   Nausea and vomiting 03/05/2019   COVID-19 virus detected 02/17/2019   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    Chronic sinusitis 04/04/2018   Allergic rhinitis 04/04/2018   Tobacco abuse 04/04/2018   Migraine without status migrainosus, not intractable 04/04/2018   Cardiac murmur 04/04/2018   Gastroesophageal reflux disease 04/04/2018   Prediabetes 04/04/2018   Fibroids 04/04/2018    Past Surgical History:  Procedure Laterality Date   BREAST SURGERY     left breast abcess   COLONOSCOPY WITH PROPOFOL N/A 09/17/2020   Procedure: COLONOSCOPY WITH PROPOFOL;  Surgeon: Jonathon Bellows, MD;  Location: [facility] ENDOSCOPY;  Service: Gastroenterology;  Laterality: N/A;   ESOPHAGOGASTRODUODENOSCOPY (EGD) WITH PROPOFOL N/A 09/17/2020   Procedure: ESOPHAGOGASTRODUODENOSCOPY (EGD) WITH PROPOFOL;  Surgeon: Jonathon Bellows, MD;  Location: [facility] ENDOSCOPY;  Service: Gastroenterology;  Laterality: N/A;    OB History   No obstetric history on file.      Home Medications    Prior to Admission medications   Medication Sig Start Date End Date Taking? Authorizing Provider  acetaminophen (TYLENOL) 500 MG tablet Take 1 tablet (500 mg total) by mouth every 4 (four) hours as needed. 03/05/19  Yes McLean-Scocuzza, Nino Glow, MD  albuterol (2.5 MG/3ML) 0.083% NEBU 3 mL, albuterol (5 MG/ML) 0.5% NEBU 0.5 mL Inhale into the lungs.   Yes [provider]  albuterol (VENTOLIN HFA) 108 (90 Base) MCG/ACT inhaler Inhale 1-2 puffs into the lungs every 4 (four) hours as needed for wheezing or shortness of breath. 05/11/21  Yes McLean-Scocuzza,  Nino Glow, MD  albuterol (VENTOLIN HFA) 108 (90 Base) MCG/ACT inhaler Inhale into the lungs every 6 (six) hours as needed.   Yes [provider]  atorvastatin (LIPITOR) 10 MG tablet Take 1 tablet (10 mg total) by mouth daily. At night 02/02/20  Yes McLean-Scocuzza, Nino Glow, MD  baclofen (LIORESAL) 10 MG tablet Take 1 tablet (10 mg total) by mouth 3 (three) times daily. 07/13/21  Yes Margarette Canada, NP  brompheniramine-pseudoephedrine-DM 30-2-10 MG/5ML syrup Take 10 mLs by mouth 4 (four) times  daily as needed for up to 7 days. 07/06/21 07/13/21 Yes Danton Clap, PA-C  cyclobenzaprine (FLEXERIL) 10 MG tablet Take 1 tablet (10 mg total) by mouth 3 (three) times daily as needed for muscle spasms. 03/21/21  Yes Merlyn Lot, MD  fluticasone [facility]) 50 MCG/ACT nasal spray Place 2 sprays into both nostrils daily. Prn max 2 sprays 05/11/21  Yes McLean-Scocuzza, Nino Glow, MD  hydrOXYzine (ATARAX/VISTARIL) 25 MG tablet Take 1 tablet (25 mg total) by mouth every 8 (eight) hours as needed for itching. 09/13/20  Yes Cook, Jayce G, DO  ibuprofen (ADVIL) 600 MG tablet Take 1 tablet (600 mg total) by mouth every 6 (six) hours as needed. 07/13/21  Yes Margarette Canada, NP  ipratropium-albuterol (DUONEB) 0.5-2.5 (3) MG/3ML SOLN Take 3 mLs by nebulization every 4 (four) hours as needed. 06/23/20  Yes Faustino Congress, NP  loratadine (CLARITIN) 10 MG tablet Take 1 tablet (10 mg total) by mouth daily as needed for allergies. 01/01/20  Yes McLean-Scocuzza, Nino Glow, MD  meclizine (ANTIVERT) 25 MG tablet Take 1 tablet (25 mg total) by mouth 3 (three) times daily as needed for dizziness. 10/20/19  Yes Norval Gable, MD  montelukast (SINGULAIR) 10 MG tablet Take 1 tablet (10 mg total) by mouth at bedtime. 05/11/21  Yes McLean-Scocuzza, Nino Glow, MD  montelukast (SINGULAIR) 10 MG tablet Take 10 mg by mouth at bedtime.   Yes [provider]  omeprazole (PRILOSEC) 40 MG capsule Take 1 capsule (40 mg total) by mouth 2 (two) times daily. 10/11/20  Yes Jonathon Bellows, MD  ondansetron (ZOFRAN ODT) 4 MG disintegrating tablet Take 1 tablet (4 mg total) by mouth every 8 (eight) hours as needed for nausea or vomiting. 12/02/20  Yes Melynda Ripple, MD  ondansetron (ZOFRAN ODT) 4 MG disintegrating tablet Take 1 tablet (4 mg total) by mouth every 8 (eight) hours as needed for nausea or vomiting. 05/19/21  Yes Brunetta Jeans, PA-C  tiZANidine (ZANAFLEX) 4 MG tablet Take 1 tablet (4 mg total) by mouth every 8 (eight)  hours as needed for muscle spasms. 12/02/20  Yes Melynda Ripple, MD  traZODone (DESYREL) 50 MG tablet Take 0.5-1 tablets (25-50 mg total) by mouth at bedtime as needed for sleep. 01/01/20  Yes McLean-Scocuzza, Nino Glow, MD  triamcinolone ointment (KENALOG) 0.5 % Apply 1 application topically 2 (two) times daily. 09/13/20  Yes Cook, Jayce G, DO  pantoprazole (PROTONIX) 40 MG tablet Take 1 tablet (40 mg total) by mouth daily. 30 min before 04/09/20 08/30/20  McLean-Scocuzza, Nino Glow, MD    Family History Family History  Problem Relation Age of Onset   Asthma Mother    COPD Mother    Diabetes Mother    Miscarriages / [place] Mother    Diabetes Father    Heart disease Father    Hypertension Father    Heart failure Father    Heart attack Father    Asthma Daughter    Miscarriages / [place] Daughter  Arthritis Maternal Grandmother    Asthma Maternal Grandmother    Hyperlipidemia Maternal Grandmother    Diabetes Maternal Grandfather    Stroke Paternal Grandmother    Heart disease Brother     Social History Social History   Tobacco Use   Smoking status: Some Days    Packs/day: 0.50    Types: Cigarettes   Smokeless tobacco: Never   Tobacco comments:    1 ppd   Vaping Use   Vaping Use: Never used  Substance Use Topics   Alcohol use: Yes    Comment: occassionally   Drug use: No     Allergies   Shellfish allergy, Strawberry (diagnostic), and Tomato   Review of Systems Review of Systems  Constitutional:  Negative for activity change, appetite change and fever.  Respiratory:  Positive for cough, shortness of breath and wheezing.   Musculoskeletal:  Positive for back pain.  Neurological:  Negative for weakness and numbness.  Hematological: Negative.   Psychiatric/Behavioral: Negative.      Physical Exam Triage Vital Signs ED Triage Vitals  Enc Vitals Group     BP 07/13/21 1944 126/65     Pulse Rate 07/13/21 1944 87     Resp 07/13/21 1944 18     Temp 07/13/21  1944 98.8 F (37.1 C)     Temp Source 07/13/21 1944 Oral     SpO2 07/13/21 1944 98 %     Weight 07/13/21 1943 147 lb (66.7 kg)     Height 07/13/21 1943 5' (1.524 m)     Head Circumference --      Peak Flow --      Pain Score 07/13/21 1943 10     Pain Loc --      Pain Edu? --      Excl. in [place]? --    No data found.  Updated Vital Signs BP 126/65 (BP Location: Left Arm)    Pulse 87    Temp 98.8 F (37.1 C) (Oral)    Resp 18    Ht 5' (1.524 m)    Wt 147 lb (66.7 kg)    SpO2 98%    BMI 28.71 kg/m   Visual Acuity Right Eye Distance:   Left Eye Distance:   Bilateral Distance:    Right Eye Near:   Left Eye Near:    Bilateral Near:     Physical Exam Vitals and nursing note reviewed.  Constitutional:      General: She is not in acute distress.    Appearance: Normal appearance. She is normal weight. She is not ill-appearing.  HENT:     Head: Normocephalic and atraumatic.  Cardiovascular:     Rate and Rhythm: Normal rate and regular rhythm.     Pulses: Normal pulses.     Heart sounds: Normal heart sounds. No murmur heard.   No gallop.  Pulmonary:     Effort: Pulmonary effort is normal.     Breath sounds: Normal breath sounds. No wheezing, rhonchi or rales.  Musculoskeletal:        General: Tenderness present. No swelling or deformity.  Skin:    General: Skin is warm and dry.     Capillary Refill: Capillary refill takes less than 2 seconds.     Findings: No erythema or rash.  Neurological:     General: No focal deficit present.     Mental Status: She is alert and oriented to person, place, and time.  Psychiatric:  Mood and Affect: Mood normal.        Behavior: Behavior normal.        Thought Content: Thought content normal.        Judgment: Judgment normal.     UC Treatments / Results  Labs (all labs ordered are listed, but only abnormal results are displayed) Labs Reviewed - No data to display  EKG   Radiology No results found.  Procedures Procedures  (including critical care time)  Medications Ordered in UC Medications - No data to display  Initial Impression / Assessment and Plan / UC Course  I have reviewed the triage vital signs and the nursing notes.  Pertinent labs & imaging results that were available during my care of the patient were reviewed by me and considered in my medical decision making (see chart for details).  Patient is a nontoxic-appearing 55 year old female here for evaluation of mid back pain that started today and is greater on the left than the right.  This not a result of any injury that the patient is aware of.  She has been doing different physical labor than she is used to at work since returning to work yesterday that has involved a lot of lifting but she denies any heavy lifting.  She cannot think of any precipitating event.  She is recovering from [place] and she has been coughing and continues to have some mild shortness of breath and wheezing.  She states that this pain is different.  On physical exam patient has clear lung sounds in all fields.  Heart sounds are S1-S2 and free of murmur, rub, or gallop.  Patient has no midline spinous tenderness but there is tenderness and muscle tension palpable in the left lower thoracic spine.  There is no reciprocal tension present in the right paraspinous region.  No lumbar tenderness noted on exam.  Suspect patient is having thoracic back pain as result of a muscle strain.  Will obtain chest x-ray to evaluate for cardiopulmonary process.  Chest x-ray independently reviewed and evaluated by me.  Impression: No evidence of pneumothorax or pneumonia.  Lung fields are well pneumatized.  Radiology overread is pending.  Will treat patient for thoracic back strain with ibuprofen and baclofen.  We will also give home physical therapy exercises and advised patient to use moist heat.   Final Clinical Impressions(s) / UC Diagnoses   Final diagnoses:  Thoracic myofascial strain,  initial encounter     Discharge Instructions      Take the ibuprofen, 600 mg every 6 hours with food, on a schedule for the next 48 hours and then as needed.  Take the baclofen, 10 mg every 8 hours, on a schedule for the next 48 hours and then as needed.  Apply moist heat to your back for 30 minutes at a time 2-3 times a day to improve blood flow to the area and help remove the lactic acid causing the spasm.  Follow the back exercises given at discharge.  Return for reevaluation for any new or worsening symptoms.      ED Prescriptions     Medication Sig Dispense Auth. Provider   ibuprofen (ADVIL) 600 MG tablet Take 1 tablet (600 mg total) by mouth every 6 (six) hours as needed. 30 tablet Margarette Canada, NP   baclofen (LIORESAL) 10 MG tablet Take 1 tablet (10 mg total) by mouth 3 (three) times daily. 5 each Margarette Canada, NP      PDMP not reviewed this encounter.  Margarette Canada, NP 07/13/21 2011

## 2021-07-13 NOTE — Discharge Instructions (Addendum)
Take the ibuprofen, 600 mg every 6 hours with food, on a schedule for the next 48 hours and then as needed.  Take the baclofen, 10 mg every 8 hours, on a schedule for the next 48 hours and then as needed.  Apply moist heat to your back for 30 minutes at a time 2-3 times a day to improve blood flow to the area and help remove the lactic acid causing the spasm.  Follow the back exercises given at discharge.  Return for reevaluation for any new or worsening symptoms.  

## 2021-09-21 ENCOUNTER — Ambulatory Visit: Payer: Self-pay | Admitting: Internal Medicine

## 2021-10-04 ENCOUNTER — Other Ambulatory Visit: Payer: Self-pay

## 2021-10-04 ENCOUNTER — Ambulatory Visit
Admission: EM | Admit: 2021-10-04 | Discharge: 2021-10-04 | Disposition: A | Discharge status: Home / Self Care | Payer: BC Managed Care – PPO | Attending: Internal Medicine | Admitting: Internal Medicine

## 2021-10-04 DIAGNOSIS — J029 Acute pharyngitis, unspecified: Secondary | ICD-10-CM | POA: Diagnosis not present

## 2021-10-04 DIAGNOSIS — R6883 Chills (without fever): Secondary | ICD-10-CM | POA: Diagnosis not present

## 2021-10-04 DIAGNOSIS — Z20822 Contact with and (suspected) exposure to covid-19: Secondary | ICD-10-CM | POA: Diagnosis not present

## 2021-10-04 DIAGNOSIS — Z8616 Personal history of COVID-19: Secondary | ICD-10-CM | POA: Diagnosis not present

## 2021-10-04 LAB — RESP PANEL BY RT-PCR (FLU A&B, COVID) ARPGX2
Influenza A by PCR: NEGATIVE
Influenza B by PCR: NEGATIVE
SARS Coronavirus 2 by RT PCR: NEGATIVE

## 2021-10-04 LAB — GROUP A STREP BY PCR: Group A Strep by PCR: NOT DETECTED

## 2021-10-04 NOTE — Discharge Instructions (Signed)
-  We will call with the results of your COVID, flu and strep test.  If you have strep you will need antibiotics.  I suspect your COVID and flu test will probably be negative. ?- We discussed different causes of sore throat including bacterial infections, viral infections, allergies and acid reflux.  You do have allergies and acid reflux.  I would suggest starting allergy medication as it is peak allergy season.  Start daily Claritin and Flonase.  Increase rest and fluids. ?

## 2021-10-04 NOTE — ED Provider Notes (Signed)
?[facility] ? ? ? ?CSN: [id number] ?Arrival date & time: 10/04/21  1011 ? ? ?  ? ?History   ?Chief Complaint ?Chief Complaint  ?Patient presents with  ? Sore Throat  ? Chills  ? Headache  ? ? ?HPI ?Teresa Deleon is a 56 y.o. female presenting for 1.5-week history of mild sore throat.  Patient reports over the past couple days she has developed chills, fatigue and headaches.  Reports a cough but says she seems to have a chronic cough likely due to smoking.  Denies any change from baseline.  Denies congestion.  No breathing difficulty, abdominal pain, nausea or vomiting.  No sick contacts or known exposure to COVID, flu or strep.  Personal history of COVID-19 3 months ago.  Patient has not been taking any medication for symptoms.  No other concerns. ? ?HPI ? ?Past Medical History:  ?Diagnosis Date  ? Asthma   ? Back pain   ? Chronic sinusitis   ? GERD (gastroesophageal reflux disease)   ? Heart murmur   ? Leaky heart valve   ? Migraine   ? Migraine   ? Trichomonas infection   ? ? ?Patient Active Problem List  ? Diagnosis Date Noted  ? Dysphagia 08/19/2020  ? Colon cancer screening 08/19/2020  ? URI with cough and congestion 06/12/2020  ? Suspected COVID-19 virus infection 06/12/2020  ? Bronchitis with asthma, acute 06/12/2020  ? Abnormal MRI, lumbar spine 04/19/2020  ? Cervicalgia 04/19/2020  ? Lumbar radiculopathy 04/19/2020  ? Vitamin D deficiency 01/26/2020  ? HLD (hyperlipidemia) 01/26/2020  ? RLS (restless legs syndrome) 01/01/2020  ? Mild intermittent asthma with acute exacerbation 01/01/2020  ? Frontal sinusitis 01/01/2020  ? Cigarette nicotine dependence without complication 35/32/9924  ? Lumbar herniated disc 06/24/2019  ? Insomnia 06/24/2019  ? Female pelvic pain 06/24/2019  ? Diarrhea 03/05/2019  ? Nausea and vomiting 03/05/2019  ? COVID-19 virus detected 02/17/2019  ? 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  ? Chronic sinusitis 04/04/2018  ? Allergic rhinitis  04/04/2018  ? Tobacco abuse 04/04/2018  ? Migraine without status migrainosus, not intractable 04/04/2018  ? Cardiac murmur 04/04/2018  ? Gastroesophageal reflux disease 04/04/2018  ? Prediabetes 04/04/2018  ? Fibroids 04/04/2018  ? ? ?Past Surgical History:  ?Procedure Laterality Date  ? BREAST SURGERY    ? left breast abcess  ? COLONOSCOPY WITH PROPOFOL N/A 09/17/2020  ? Procedure: COLONOSCOPY WITH PROPOFOL;  Surgeon: Jonathon Bellows, MD;  Location: [facility] ENDOSCOPY;  Service: Gastroenterology;  Laterality: N/A;  ? ESOPHAGOGASTRODUODENOSCOPY (EGD) WITH PROPOFOL N/A 09/17/2020  ? Procedure: ESOPHAGOGASTRODUODENOSCOPY (EGD) WITH PROPOFOL;  Surgeon: Jonathon Bellows, MD;  Location: [facility] ENDOSCOPY;  Service: Gastroenterology;  Laterality: N/A;  ? ? ?OB History   ?No obstetric history on file. ?  ? ? ? ?Home Medications   ? ?Prior to Admission medications   ?Medication Sig Start Date End Date Taking? Authorizing Provider  ?acetaminophen (TYLENOL) 500 MG tablet Take 1 tablet (500 mg total) by mouth every 4 (four) hours as needed. 03/05/19   McLean-Scocuzza, Nino Glow, MD  ?albuterol (2.5 MG/3ML) 0.083% NEBU 3 mL, albuterol (5 MG/ML) 0.5% NEBU 0.5 mL Inhale into the lungs.    [provider]  ?albuterol (VENTOLIN HFA) 108 (90 Base) MCG/ACT inhaler Inhale 1-2 puffs into the lungs every 4 (four) hours as needed for wheezing or shortness of breath. 05/11/21   McLean-Scocuzza, Nino Glow, MD  ?albuterol (VENTOLIN HFA) 108 (90 Base) MCG/ACT inhaler Inhale into the  lungs every 6 (six) hours as needed.    [provider]  ?atorvastatin (LIPITOR) 10 MG tablet Take 1 tablet (10 mg total) by mouth daily. At night 02/02/20   McLean-Scocuzza, Nino Glow, MD  ?baclofen (LIORESAL) 10 MG tablet Take 1 tablet (10 mg total) by mouth 3 (three) times daily. 07/13/21   Margarette Canada, NP  ?cyclobenzaprine (FLEXERIL) 10 MG tablet Take 1 tablet (10 mg total) by mouth 3 (three) times daily as needed for muscle spasms. 03/21/21   Merlyn Lot, MD   ?fluticasone [facility]) 50 MCG/ACT nasal spray Place 2 sprays into both nostrils daily. Prn max 2 sprays 05/11/21   McLean-Scocuzza, Nino Glow, MD  ?hydrOXYzine (ATARAX/VISTARIL) 25 MG tablet Take 1 tablet (25 mg total) by mouth every 8 (eight) hours as needed for itching. 09/13/20   Coral Spikes, DO  ?ibuprofen (ADVIL) 600 MG tablet Take 1 tablet (600 mg total) by mouth every 6 (six) hours as needed. 07/13/21   Margarette Canada, NP  ?ipratropium-albuterol (DUONEB) 0.5-2.5 (3) MG/3ML SOLN Take 3 mLs by nebulization every 4 (four) hours as needed. 06/23/20   Faustino Congress, NP  ?loratadine (CLARITIN) 10 MG tablet Take 1 tablet (10 mg total) by mouth daily as needed for allergies. 01/01/20   McLean-Scocuzza, Nino Glow, MD  ?meclizine (ANTIVERT) 25 MG tablet Take 1 tablet (25 mg total) by mouth 3 (three) times daily as needed for dizziness. 10/20/19   Norval Gable, MD  ?montelukast (SINGULAIR) 10 MG tablet Take 1 tablet (10 mg total) by mouth at bedtime. 05/11/21   McLean-Scocuzza, Nino Glow, MD  ?montelukast (SINGULAIR) 10 MG tablet Take 10 mg by mouth at bedtime.    [provider]  ?omeprazole (PRILOSEC) 40 MG capsule Take 1 capsule (40 mg total) by mouth 2 (two) times daily. 10/11/20   Jonathon Bellows, MD  ?ondansetron (ZOFRAN ODT) 4 MG disintegrating tablet Take 1 tablet (4 mg total) by mouth every 8 (eight) hours as needed for nausea or vomiting. 12/02/20   Melynda Ripple, MD  ?ondansetron (ZOFRAN ODT) 4 MG disintegrating tablet Take 1 tablet (4 mg total) by mouth every 8 (eight) hours as needed for nausea or vomiting. 05/19/21   Brunetta Jeans, PA-C  ?tiZANidine (ZANAFLEX) 4 MG tablet Take 1 tablet (4 mg total) by mouth every 8 (eight) hours as needed for muscle spasms. 12/02/20   Melynda Ripple, MD  ?traZODone (DESYREL) 50 MG tablet Take 0.5-1 tablets (25-50 mg total) by mouth at bedtime as needed for sleep. 01/01/20   McLean-Scocuzza, Nino Glow, MD  ?triamcinolone ointment (KENALOG) 0.5 % Apply 1 application  topically 2 (two) times daily. 09/13/20   Coral Spikes, DO  ?pantoprazole (PROTONIX) 40 MG tablet Take 1 tablet (40 mg total) by mouth daily. 30 min before 04/09/20 08/30/20  McLean-Scocuzza, Nino Glow, MD  ? ? ?Family History ?Family History  ?Problem Relation Age of Onset  ? Asthma Mother   ? COPD Mother   ? Diabetes Mother   ? Miscarriages / [place] Mother   ? Diabetes Father   ? Heart disease Father   ? Hypertension Father   ? Heart failure Father   ? Heart attack Father   ? Asthma Daughter   ? Miscarriages / [place] Daughter   ? Arthritis Maternal Grandmother   ? Asthma Maternal Grandmother   ? Hyperlipidemia Maternal Grandmother   ? Diabetes Maternal Grandfather   ? Stroke Paternal Grandmother   ? Heart disease Brother   ? ? ?Social History ?Social  History  ? ?Tobacco Use  ? Smoking status: Some Days  ?  Packs/day: 0.50  ?  Types: Cigarettes  ? Smokeless tobacco: Never  ? Tobacco comments:  ?  1 ppd   ?Vaping Use  ? Vaping Use: Never used  ?Substance Use Topics  ? Alcohol use: Yes  ?  Comment: occassionally  ? Drug use: No  ? ? ? ?Allergies   ?Shellfish allergy, Strawberry (diagnostic), and Tomato ? ? ?Review of Systems ?Review of Systems  ?Constitutional:  Positive for chills and fatigue. Negative for diaphoresis and fever.  ?HENT:  Positive for sore throat. Negative for congestion, ear pain, rhinorrhea, sinus pressure and sinus pain.   ?Respiratory:  Positive for cough. Negative for shortness of breath.   ?Gastrointestinal:  Negative for abdominal pain, nausea and vomiting.  ?Musculoskeletal:  Negative for arthralgias and myalgias.  ?Skin:  Negative for rash.  ?Neurological:  Positive for headaches. Negative for weakness.  ?Hematological:  Negative for adenopathy.  ? ? ?Physical Exam ?Triage Vital Signs ?ED Triage Vitals [10/04/21 1110]  ?Enc Vitals Group  ?   BP (!) 156/78  ?   Pulse Rate 73  ?   Resp 16  ?   Temp 98.1 ?F (36.7 ?C)  ?   Temp Source Oral  ?   SpO2 100 %  ?   Weight   ?   Height   ?   Head  Circumference   ?   Peak Flow   ?   Pain Score   ?   Pain Loc   ?   Pain Edu?   ?   Excl. in [place]?   ? ?No data found. ? ?Updated Vital Signs ?BP (!) 156/78 (BP Location: Left Arm)   Pulse 73   Temp 98.1 ?F (3

## 2021-10-04 NOTE — ED Triage Notes (Signed)
Patient presents to Urgent Care with complaints of chills, headache, since Sunday and sore throat x 1.5 weeks ago. Treating symptoms with tylenol and ibuprofen.  ?

## 2021-11-09 ENCOUNTER — Encounter: Payer: Self-pay | Admitting: Internal Medicine

## 2021-11-09 ENCOUNTER — Other Ambulatory Visit: Payer: Self-pay

## 2021-11-09 ENCOUNTER — Ambulatory Visit (INDEPENDENT_AMBULATORY_CARE_PROVIDER_SITE_OTHER): Payer: BC Managed Care – PPO | Admitting: Internal Medicine

## 2021-11-09 ENCOUNTER — Other Ambulatory Visit: Payer: Self-pay | Admitting: Internal Medicine

## 2021-11-09 VITALS — BP 112/70 | HR 74 | Temp 97.6°F | Ht 60.0 in | Wt 147.6 lb

## 2021-11-09 DIAGNOSIS — E559 Vitamin D deficiency, unspecified: Secondary | ICD-10-CM

## 2021-11-09 DIAGNOSIS — R110 Nausea: Secondary | ICD-10-CM

## 2021-11-09 DIAGNOSIS — M5442 Lumbago with sciatica, left side: Secondary | ICD-10-CM

## 2021-11-09 DIAGNOSIS — Z0000 Encounter for general adult medical examination without abnormal findings: Secondary | ICD-10-CM | POA: Diagnosis not present

## 2021-11-09 DIAGNOSIS — M5416 Radiculopathy, lumbar region: Secondary | ICD-10-CM

## 2021-11-09 DIAGNOSIS — Z23 Encounter for immunization: Secondary | ICD-10-CM

## 2021-11-09 DIAGNOSIS — F17210 Nicotine dependence, cigarettes, uncomplicated: Secondary | ICD-10-CM

## 2021-11-09 DIAGNOSIS — Z113 Encounter for screening for infections with a predominantly sexual mode of transmission: Secondary | ICD-10-CM

## 2021-11-09 DIAGNOSIS — Z1231 Encounter for screening mammogram for malignant neoplasm of breast: Secondary | ICD-10-CM | POA: Diagnosis not present

## 2021-11-09 DIAGNOSIS — J4521 Mild intermittent asthma with (acute) exacerbation: Secondary | ICD-10-CM

## 2021-11-09 DIAGNOSIS — E785 Hyperlipidemia, unspecified: Secondary | ICD-10-CM

## 2021-11-09 DIAGNOSIS — J4551 Severe persistent asthma with (acute) exacerbation: Secondary | ICD-10-CM

## 2021-11-09 DIAGNOSIS — J309 Allergic rhinitis, unspecified: Secondary | ICD-10-CM

## 2021-11-09 DIAGNOSIS — L910 Hypertrophic scar: Secondary | ICD-10-CM

## 2021-11-09 DIAGNOSIS — G4700 Insomnia, unspecified: Secondary | ICD-10-CM

## 2021-11-09 DIAGNOSIS — M542 Cervicalgia: Secondary | ICD-10-CM

## 2021-11-09 DIAGNOSIS — G8929 Other chronic pain: Secondary | ICD-10-CM

## 2021-11-09 DIAGNOSIS — M5441 Lumbago with sciatica, right side: Secondary | ICD-10-CM

## 2021-11-09 LAB — CBC WITH DIFFERENTIAL/PLATELET
Basophils Absolute: 0 10*3/uL (ref 0.0–0.1)
Basophils Relative: 0.6 % (ref 0.0–3.0)
Eosinophils Absolute: 0.1 10*3/uL (ref 0.0–0.7)
Eosinophils Relative: 1.8 % (ref 0.0–5.0)
HCT: 42.3 % (ref 36.0–46.0)
Hemoglobin: 14.1 g/dL (ref 12.0–15.0)
Lymphocytes Relative: 28.7 % (ref 12.0–46.0)
Lymphs Abs: 1.9 10*3/uL (ref 0.7–4.0)
MCHC: 33.3 g/dL (ref 30.0–36.0)
MCV: 92 fl (ref 78.0–100.0)
Monocytes Absolute: 0.4 10*3/uL (ref 0.1–1.0)
Monocytes Relative: 5.6 % (ref 3.0–12.0)
Neutro Abs: 4.2 10*3/uL (ref 1.4–7.7)
Neutrophils Relative %: 63.3 % (ref 43.0–77.0)
Platelets: 336 10*3/uL (ref 150.0–400.0)
RBC: 4.6 Mil/uL (ref 3.87–5.11)
RDW: 13.9 % (ref 11.5–15.5)
WBC: 6.6 10*3/uL (ref 4.0–10.5)

## 2021-11-09 LAB — COMPREHENSIVE METABOLIC PANEL
ALT: 11 U/L (ref 0–35)
AST: 11 U/L (ref 0–37)
Albumin: 4.5 g/dL (ref 3.5–5.2)
Alkaline Phosphatase: 88 U/L (ref 39–117)
BUN: 11 mg/dL (ref 6–23)
CO2: 25 mEq/L (ref 19–32)
Calcium: 9.3 mg/dL (ref 8.4–10.5)
Chloride: 105 mEq/L (ref 96–112)
Creatinine, Ser: 0.78 mg/dL (ref 0.40–1.20)
GFR: 84.01 mL/min (ref >60.00)
Glucose, Bld: 95 mg/dL (ref 70–99)
Potassium: 3.7 mEq/L (ref 3.5–5.1)
Sodium: 139 mEq/L (ref 135–145)
Total Bilirubin: 0.2 mg/dL (ref 0.2–1.2)
Total Protein: 7.1 g/dL (ref 6.0–8.3)

## 2021-11-09 LAB — LIPID PANEL
Cholesterol: 212 mg/dL — ABNORMAL HIGH (ref 0–200)
HDL: 42.6 mg/dL (ref >39.00)
NonHDL: 169.62
Total CHOL/HDL Ratio: 5
Triglycerides: 203 mg/dL — ABNORMAL HIGH (ref 0.0–149.0)
VLDL: 40.6 mg/dL — ABNORMAL HIGH (ref 0.0–40.0)

## 2021-11-09 LAB — LDL CHOLESTEROL, DIRECT: Direct LDL: 153 mg/dL

## 2021-11-09 LAB — TSH: TSH: 2.88 u[IU]/mL (ref 0.35–5.50)

## 2021-11-09 LAB — VITAMIN D 25 HYDROXY (VIT D DEFICIENCY, FRACTURES): VITD: 10.65 ng/mL — ABNORMAL LOW (ref 30.00–100.00)

## 2021-11-09 MED ORDER — MONTELUKAST SODIUM 10 MG PO TABS: 10.0000 mg | ORAL_TABLET | Freq: Every day | ORAL | 3 refills | Status: DC

## 2021-11-09 MED ORDER — ALBUTEROL SULFATE HFA 108 (90 BASE) MCG/ACT IN AERS: 1.0000 | INHALATION_SPRAY | Freq: Four times a day (QID) | RESPIRATORY_TRACT | 11 refills | Status: DC | PRN

## 2021-11-09 MED ORDER — ATORVASTATIN CALCIUM 10 MG PO TABS: 10.0000 mg | ORAL_TABLET | Freq: Every day | ORAL | 3 refills | Status: DC

## 2021-11-09 MED ORDER — IPRATROPIUM-ALBUTEROL 0.5-2.5 (3) MG/3ML IN SOLN: 3.0000 mL | RESPIRATORY_TRACT | 0 refills | Status: DC | PRN

## 2021-11-09 MED ORDER — TRAZODONE HCL 50 MG PO TABS: 25.0000 mg | ORAL_TABLET | Freq: Every evening | ORAL | 5 refills | Status: DC | PRN

## 2021-11-09 MED ORDER — LORATADINE 10 MG PO TABS: 10.0000 mg | ORAL_TABLET | Freq: Every day | ORAL | 3 refills | Status: DC | PRN

## 2021-11-09 MED ORDER — MONTELUKAST SODIUM 10 MG PO TABS: 10.0000 mg | ORAL_TABLET | Freq: Every day | ORAL | 0 refills | Status: DC

## 2021-11-09 MED ORDER — TIZANIDINE HCL 4 MG PO TABS: 4.0000 mg | ORAL_TABLET | Freq: Two times a day (BID) | ORAL | 5 refills | Status: DC | PRN

## 2021-11-09 MED ORDER — TETANUS-DIPHTH-ACELL PERTUSSIS 5-2.5-18.5 LF-MCG/0.5 IM SUSP
0.5000 mL | Freq: Once | INTRAMUSCULAR | 0 refills | Status: AC
Start: 2021-11-09 — End: 2021-11-09

## 2021-11-09 MED ORDER — ONDANSETRON 4 MG PO TBDP: 4.0000 mg | ORAL_TABLET | Freq: Three times a day (TID) | ORAL | 0 refills | Status: DC | PRN

## 2021-11-09 MED ORDER — NICOTINE 7 MG/24HR TD PT24: 7.0000 mg | MEDICATED_PATCH | Freq: Every day | TRANSDERMAL | 0 refills | Status: DC

## 2021-11-09 MED ORDER — NICOTINE 21 MG/24HR TD PT24: 21.0000 mg | MEDICATED_PATCH | Freq: Every day | TRANSDERMAL | 0 refills | Status: DC

## 2021-11-09 MED ORDER — TRAZODONE HCL 50 MG PO TABS: 25.0000 mg | ORAL_TABLET | Freq: Every evening | ORAL | 11 refills | Status: DC | PRN

## 2021-11-09 MED ORDER — NICOTINE 14 MG/24HR TD PT24: 14.0000 mg | MEDICATED_PATCH | Freq: Every day | TRANSDERMAL | 0 refills | Status: DC

## 2021-11-09 MED ORDER — OMEPRAZOLE 40 MG PO CPDR: 40.0000 mg | DELAYED_RELEASE_CAPSULE | Freq: Every day | ORAL | 3 refills | Status: DC

## 2021-11-09 MED ORDER — MONTELUKAST SODIUM 10 MG PO TABS: ORAL_TABLET | ORAL | 3 refills | Status: DC

## 2021-11-09 MED ORDER — ALBUTEROL SULFATE HFA 108 (90 BASE) MCG/ACT IN AERS: 1.0000 | INHALATION_SPRAY | RESPIRATORY_TRACT | 1 refills | Status: DC | PRN

## 2021-11-09 MED ORDER — OMEPRAZOLE 40 MG PO CPDR: 40.0000 mg | DELAYED_RELEASE_CAPSULE | Freq: Two times a day (BID) | ORAL | 1 refills | Status: DC

## 2021-11-09 MED ORDER — CHOLECALCIFEROL 1.25 MG (50000 UT) PO CAPS: 50000.0000 [IU] | ORAL_CAPSULE | ORAL | 1 refills | Status: DC

## 2021-11-09 NOTE — Patient Instructions (Addendum)
[facility]-Dr. Lanier Prude ?4.1 ?136 Google reviews ?Dermatologist in [place], [place] ?Get online care: Learn more ?Address: [address], [place], [place] [address] ?Hours:  ?Open ? Closes 5?PM ?Products and Services: [email or web address] ?Phone: [phone] ? ?Tdap (Tetanus, Diphtheria, Pertussis) Vaccine: What You Need to Know ?1. Why get vaccinated? ?Tdap vaccine can prevent tetanus, diphtheria, and pertussis. ?Diphtheria and pertussis spread from person to person. Tetanus enters the body through cuts or wounds. ?TETANUS (T) causes painful stiffening of the muscles. Tetanus can lead to serious health problems, including being unable to open the mouth, having trouble swallowing and breathing, or death. ?DIPHTHERIA (D) can lead to difficulty breathing, heart failure, paralysis, or death. ?PERTUSSIS (aP), also known as "whooping cough," can cause uncontrollable, violent coughing that makes it hard to breathe, eat, or drink. Pertussis can be extremely serious especially in babies and young children, causing pneumonia, convulsions, brain damage, or death. In teens and adults, it can cause weight loss, loss of bladder control, passing out, and rib fractures from severe coughing. ?2. Tdap vaccine ?Tdap is only for children 7 years and older, adolescents, and adults.  ?Adolescents should receive a single dose of Tdap, preferably at age 44 or 52 years. ?Pregnant people should get a dose of Tdap during every pregnancy, preferably during the early part of the third trimester, to help protect the newborn from pertussis. Infants are most at risk for severe, life-threatening complications from pertussis. ?Adults who have never received Tdap should get a dose of Tdap. ?Also, adults should receive a booster dose of either Tdap or Td (a different vaccine that protects against tetanus and diphtheria but not pertussis) every 10 years, or after 5 years in the case of a severe or dirty wound or burn. ?Tdap may  be given at the same time as other vaccines. ?3. Talk with your health care provider ?Tell your vaccine provider if the person getting the vaccine: ?Has had an allergic reaction after a previous dose of any vaccine that protects against tetanus, diphtheria, or pertussis, or has any severe, life-threatening allergies ?Has had a coma, decreased level of consciousness, or prolonged seizures within 7 days after a previous dose of any pertussis vaccine (DTP, DTaP, or Tdap) ?Has seizures or another nervous system problem ?Has ever had Guillain-Barr? Syndrome (also called "GBS") ?Has had severe pain or swelling after a previous dose of any vaccine that protects against tetanus or diphtheria ?In some cases, your health care provider may decide to postpone Tdap vaccination until a future visit. ?People with minor illnesses, such as a cold, may be vaccinated. People who are moderately or severely ill should usually wait until they recover before getting Tdap vaccine.  ?Your health care provider can give you more information. ?4. Risks of a vaccine reaction ?Pain, redness, or swelling where the shot was given, mild fever, headache, feeling tired, and nausea, vomiting, diarrhea, or stomachache sometimes happen after Tdap vaccination. ?People sometimes faint after medical procedures, including vaccination. Tell your provider if you feel dizzy or have vision changes or ringing in the ears.  ?As with any medicine, there is a very remote chance of a vaccine causing a severe allergic reaction, other serious injury, or death. ?5. What if there is a serious problem? ?An allergic reaction could occur after the vaccinated person leaves the clinic. If you see signs of a severe allergic reaction (hives, swelling of the face and throat, difficulty breathing, a fast heartbeat, dizziness, or weakness), call 9-1-1 and get the person to  the nearest hospital. ?For other signs that concern you, call your health care provider.  ?Adverse reactions  should be reported to the Vaccine Adverse Event Reporting System (VAERS). Your health care provider will usually file this report, or you can do it yourself. Visit the VAERS website at www.vaers.SamedayNews.es or call (781)240-2757. VAERS is only for reporting reactions, and VAERS staff members do not give medical advice. ?6. The National Vaccine Injury Compensation Program ?The National Vaccine Injury Compensation Program (VICP) is a federal program that was created to compensate people who may have been injured by certain vaccines. Claims regarding alleged injury or death due to vaccination have a time limit for filing, which may be as short as two years. Visit the VICP website at GoldCloset.com.ee or call 7343385471 to learn about the program and about filing a claim. ?7. How can I learn more? ?Ask your health care provider. ?Call your local or state health department. ?Visit the website of the Food and Drug Administration (FDA) for vaccine package inserts and additional information at TraderRating.uy. ?Contact the Centers for Disease Control and Prevention (CDC): ?Call (601)855-0819 (1-800-CDC-INFO) or ?Visit CDC's website at http://hunter.com/. ?Source: CDC Vaccine Information Statement Tdap (Tetanus, Diphtheria, Pertussis) Vaccine (02/27/2020) ?This same material is available at http://www.wolf.info/ for no charge. ?This information is not intended to replace advice given to you by your health care provider. Make sure you discuss any questions you have with your health care provider. ?Document Revised: 06/08/2021 Document Reviewed: 04/11/2021 ?Elsevier Patient Education ? [place]. ? ?

## 2021-11-09 NOTE — Addendum Note (Signed)
Addended by: Orland Mustard on: 11/09/2021 11:07 AM ? ? Modules accepted: Orders ? ?

## 2021-11-09 NOTE — Progress Notes (Signed)
Chief Complaint  ?Patient presents with  ? Follow-up  ? Medication Refill  ? Annual Exam  ? ?Annual  ?1. Chronic neck and back pain needs refills zanaflex 4 mg bid prn  ?2. Nicotine dep smoking less 5 cigs per day wants patches  ?And handicap as sob with walking long distance to work  ?3. Left chest painful and itchy keloid refer to dermatology  ? ? ?Review of Systems  ?Constitutional:  Negative for weight loss.  ?HENT:  Negative for hearing loss.   ?Eyes:  Negative for blurred vision.  ?Respiratory:  Negative for shortness of breath.   ?Cardiovascular:  Negative for chest pain.  ?Gastrointestinal:  Negative for abdominal pain and blood in stool.  ?Genitourinary:  Negative for dysuria.  ?Musculoskeletal:  Negative for falls and joint pain.  ?Skin:  Negative for rash.  ?Neurological:  Negative for headaches.  ?Psychiatric/Behavioral:  Negative for depression.   ?Past Medical History:  ?Diagnosis Date  ? Asthma   ? Back pain   ? Chronic sinusitis   ? GERD (gastroesophageal reflux disease)   ? Heart murmur   ? Leaky heart valve   ? Migraine   ? Migraine   ? Trichomonas infection   ? ?Past Surgical History:  ?Procedure Laterality Date  ? BREAST SURGERY    ? left breast abcess  ? COLONOSCOPY WITH PROPOFOL N/A 09/17/2020  ? Procedure: COLONOSCOPY WITH PROPOFOL;  Surgeon: Jonathon Bellows, MD;  Location: [facility] ENDOSCOPY;  Service: Gastroenterology;  Laterality: N/A;  ? ESOPHAGOGASTRODUODENOSCOPY (EGD) WITH PROPOFOL N/A 09/17/2020  ? Procedure: ESOPHAGOGASTRODUODENOSCOPY (EGD) WITH PROPOFOL;  Surgeon: Jonathon Bellows, MD;  Location: [facility] ENDOSCOPY;  Service: Gastroenterology;  Laterality: N/A;  ? ?Family History  ?Problem Relation Age of Onset  ? Asthma Mother   ? COPD Mother   ? Diabetes Mother   ? Miscarriages / [place] Mother   ? Diabetes Father   ? Heart disease Father   ? Hypertension Father   ? Heart failure Father   ? Heart attack Father   ? Asthma Daughter   ? Miscarriages / [place] Daughter   ? Arthritis Maternal  Grandmother   ? Asthma Maternal Grandmother   ? Hyperlipidemia Maternal Grandmother   ? Diabetes Maternal Grandfather   ? Stroke Paternal Grandmother   ? Heart disease Brother   ? ?Social History  ? ?Socioeconomic History  ? Marital status: Single  ?  Spouse name: Not on file  ? Number of children: Not on file  ? Years of education: Not on file  ? Highest education level: Not on file  ?Occupational History  ? Not on file  ?Tobacco Use  ? Smoking status: Some Days  ?  Packs/day: 0.50  ?  Types: Cigarettes  ? Smokeless tobacco: Never  ? Tobacco comments:  ?  1 ppd   ?Vaping Use  ? Vaping Use: Never used  ?Substance and Sexual Activity  ? Alcohol use: Yes  ?  Comment: occassionally  ? Drug use: No  ? Sexual activity: Yes  ?  Comment: men  ?Other Topics Concern  ? Not on file  ?Social History Narrative  ? GED  ? Works [employer]   ? Engaged   ? 2 kids   ? No guns, wears selt belt, safe in relationship   ? ?Social Determinants of Health  ? ?Financial Resource Strain: Not on file  ?Food Insecurity: Not on file  ?Transportation Needs: Not on file  ?Physical Activity: Not on file  ?Stress: Not on  file  ?Social Connections: Not on file  ?Intimate Partner Violence: Not on file  ? ?Current Meds  ?Medication Sig  ? acetaminophen (TYLENOL) 500 MG tablet Take 1 tablet (500 mg total) by mouth every 4 (four) hours as needed.  ? albuterol (2.5 MG/3ML) 0.083% NEBU 3 mL, albuterol (5 MG/ML) 0.5% NEBU 0.5 mL Inhale into the lungs.  ? fluticasone (FLONASE) 50 MCG/ACT nasal spray Place 2 sprays into both nostrils daily. Prn max 2 sprays  ? ibuprofen (ADVIL) 600 MG tablet Take 1 tablet (600 mg total) by mouth every 6 (six) hours as needed.  ? meclizine (ANTIVERT) 25 MG tablet Take 1 tablet (25 mg total) by mouth 3 (three) times daily as needed for dizziness.  ? ondansetron (ZOFRAN ODT) 4 MG disintegrating tablet Take 1 tablet (4 mg total) by mouth every 8 (eight) hours as needed for nausea or vomiting.  ? Tdap (BOOSTRIX) 5-2.5-18.5  LF-MCG/0.5 injection Inject 0.5 mLs into the muscle once for 1 dose.  ? triamcinolone ointment (KENALOG) 0.5 % Apply 1 application topically 2 (two) times daily.  ? [DISCONTINUED] albuterol (VENTOLIN HFA) 108 (90 Base) MCG/ACT inhaler Inhale 1-2 puffs into the lungs every 4 (four) hours as needed for wheezing or shortness of breath.  ? [DISCONTINUED] albuterol (VENTOLIN HFA) 108 (90 Base) MCG/ACT inhaler Inhale into the lungs every 6 (six) hours as needed.  ? [DISCONTINUED] atorvastatin (LIPITOR) 10 MG tablet Take 1 tablet (10 mg total) by mouth daily. At night  ? [DISCONTINUED] cyclobenzaprine (FLEXERIL) 10 MG tablet Take 1 tablet (10 mg total) by mouth 3 (three) times daily as needed for muscle spasms.  ? [DISCONTINUED] ipratropium-albuterol (DUONEB) 0.5-2.5 (3) MG/3ML SOLN Take 3 mLs by nebulization every 4 (four) hours as needed.  ? [DISCONTINUED] loratadine (CLARITIN) 10 MG tablet Take 1 tablet (10 mg total) by mouth daily as needed for allergies.  ? [DISCONTINUED] montelukast (SINGULAIR) 10 MG tablet Take 1 tablet (10 mg total) by mouth at bedtime.  ? [DISCONTINUED] montelukast (SINGULAIR) 10 MG tablet Take 10 mg by mouth at bedtime.  ? [DISCONTINUED] omeprazole (PRILOSEC) 40 MG capsule Take 1 capsule (40 mg total) by mouth 2 (two) times daily.  ? [DISCONTINUED] ondansetron (ZOFRAN ODT) 4 MG disintegrating tablet Take 1 tablet (4 mg total) by mouth every 8 (eight) hours as needed for nausea or vomiting.  ? [DISCONTINUED] tiZANidine (ZANAFLEX) 4 MG tablet Take 1 tablet (4 mg total) by mouth every 8 (eight) hours as needed for muscle spasms.  ? [DISCONTINUED] traZODone (DESYREL) 50 MG tablet Take 0.5-1 tablets (25-50 mg total) by mouth at bedtime as needed for sleep.  ? ?Allergies  ?Allergen Reactions  ? Shellfish Allergy Itching  ? Strawberry (Diagnostic) Itching  ? Tomato Itching  ? ?Recent Results (from the past 2160 hour(s))  ?Resp Panel by RT-PCR (Flu A&B, Covid) Nasopharyngeal Swab     Status: None  ?  Collection Time: 10/04/21 11:10 AM  ? Specimen: Nasopharyngeal Swab; Nasopharyngeal(NP) swabs in vial transport medium  ?Result Value Ref Range  ? SARS Coronavirus 2 by RT PCR NEGATIVE NEGATIVE  ?  Comment: (NOTE) ?SARS-CoV-2 target nucleic acids are NOT DETECTED. ? ?The SARS-CoV-2 RNA is generally detectable in upper respiratory ?specimens during the acute phase of infection. The lowest ?concentration of SARS-CoV-2 viral copies this assay can detect is ?138 copies/mL. A negative result does not preclude SARS-Cov-2 ?infection and should not be used as the sole basis for treatment or ?other patient management decisions. A negative result may occur with  ?  improper specimen collection/handling, submission of specimen other ?than nasopharyngeal swab, presence of viral mutation(s) within the ?areas targeted by this assay, and inadequate number of viral ?copies(<138 copies/mL). A negative result must be combined with ?clinical observations, patient history, and epidemiological ?information. The expected result is Negative. ? ?Fact Sheet for Patients:  ?EntrepreneurPulse.com.au ? ?Fact Sheet for Healthcare Providers:  ?IncredibleEmployment.be ? ?This test is no t yet approved or cleared by the [place] FDA and  ?has been authorized for detection and/or diagnosis of SARS-CoV-2 by ?FDA under an Emergency Use Authorization (EUA). This EUA will remain  ?in effect (meaning this test can be used) for the duration of the ?COVID-19 declaration under Section 564(b)(1) of the Act, 21 ?U.S.C.section 360bbb-3(b)(1), unless the authorization is terminated  ?or revoked sooner.  ? ? ?  ? Influenza A by PCR NEGATIVE NEGATIVE  ? Influenza B by PCR NEGATIVE NEGATIVE  ?  Comment: (NOTE) ?The Xpert Xpress SARS-CoV-2/FLU/RSV plus assay is intended as an aid ?in the diagnosis of influenza from Nasopharyngeal swab specimens and ?should not be used as a sole basis for treatment. Nasal washings  and ?aspirates are unacceptable for Xpert Xpress SARS-CoV-2/FLU/RSV ?testing. ? ?Fact Sheet for Patients: ?EntrepreneurPulse.com.au ? ?Fact Sheet for Healthcare Providers: ?RingConnections.si

## 2021-11-10 ENCOUNTER — Telehealth: Payer: Self-pay

## 2021-11-10 LAB — URINALYSIS, ROUTINE W REFLEX MICROSCOPIC
Bilirubin Urine: NEGATIVE
Glucose, UA: NEGATIVE
Hgb urine dipstick: NEGATIVE
Ketones, ur: NEGATIVE
Leukocytes,Ua: NEGATIVE
Nitrite: NEGATIVE
Protein, ur: NEGATIVE
Specific Gravity, Urine: 1.009 (ref 1.001–1.035)
pH: 5.5 (ref 5.0–8.0)

## 2021-11-10 NOTE — Telephone Encounter (Signed)
Lvm for pt to return call in regards to labs.  ?

## 2021-12-05 ENCOUNTER — Ambulatory Visit
Admission: EM | Admit: 2021-12-05 | Discharge: 2021-12-05 | Disposition: A | Discharge status: Home / Self Care | Payer: BC Managed Care – PPO | Attending: Family | Admitting: Family

## 2021-12-05 DIAGNOSIS — M5441 Lumbago with sciatica, right side: Secondary | ICD-10-CM

## 2021-12-05 DIAGNOSIS — G8929 Other chronic pain: Secondary | ICD-10-CM | POA: Diagnosis not present

## 2021-12-05 DIAGNOSIS — M5442 Lumbago with sciatica, left side: Secondary | ICD-10-CM | POA: Diagnosis not present

## 2021-12-05 MED ORDER — DICLOFENAC SODIUM 75 MG PO TBEC: 75.0000 mg | DELAYED_RELEASE_TABLET | Freq: Two times a day (BID) | ORAL | 1 refills | Status: DC | PRN

## 2021-12-05 NOTE — ED Triage Notes (Signed)
Patient presents to Urgent Care with complaints of lower back pain that radiates down to her ankles since last week. She believes it is related to her job that requires lifting. She states she has a hx of chronic back pain and sciatica. Treating symptoms with muscle relaxant, tylenol, and ibuprofen.  ?

## 2021-12-05 NOTE — Discharge Instructions (Addendum)
Recommend trial Diclofenac '75mg'$  every 12 hours as needed for pain. Do not take Ibuprofen if taking Diclofenac. Encouraged to apply warm moist heat to back area as needed for comfort. Continue Tizanidine muscle relaxer as prescribed. Follow-up with your PCP in 4 to 5 days if minimal improvement.  ?

## 2021-12-06 NOTE — ED Provider Notes (Signed)
?[facility] ? ? ? ?CSN: [id number] ?Arrival date & time: 12/05/21  1849 ? ? ?  ? ?History   ?Chief Complaint ?Chief Complaint  ?Patient presents with  ? Back Pain  ? ? ?HPI ?Teresa Deleon is a 57 y.o. female.  ? ?56 year old female presents with low back pain with bilateral radiation of the pain down her legs to her ankles that has become more significant in the past week. No fever, change in bowel/bladder control or hematuria. Has history of herniated disc and chronic low back pain with sciatica and has been prescribed Zanaflex prn. Has taken 2 doses in the past 2 days with some relief. Has been using a new machine at work which requires some additional lifting. Trying to use her abdominal muscles and arms and not her back as much. Also has been traveling and sleeping on a different type of bed which can flare up her symptoms. Has taken Prednisone in the past but caused a severe headache so does not want any steroids. Also has taken a medication that begins with "M"- not sure if muscle relaxer or NSAID and had a reaction. Has not taken Ibuprofen recently. Other chronic health issues include hyperlipidemia, GERD, heart murmur, migraine headaches, insomnia, asthma and seasonal allergies. Currently on Claritin, Flonase and Singulair daily and Albuterol and Trazodone prn. Has been prescribed but does not take Lipitor or Prilosec.  ? ?The history is provided by the patient.  ? ?Past Medical History:  ?Diagnosis Date  ? Asthma   ? Back pain   ? Chronic sinusitis   ? GERD (gastroesophageal reflux disease)   ? Heart murmur   ? Leaky heart valve   ? Migraine   ? Migraine   ? Trichomonas infection   ? ? ?Patient Active Problem List  ? Diagnosis Date Noted  ? Dysphagia 08/19/2020  ? Colon cancer screening 08/19/2020  ? URI with cough and congestion 06/12/2020  ? Suspected COVID-19 virus infection 06/12/2020  ? Bronchitis with asthma, acute 06/12/2020  ? Abnormal MRI, lumbar spine 04/19/2020  ? Cervicalgia  04/19/2020  ? Lumbar radiculopathy 04/19/2020  ? Vitamin D deficiency 01/26/2020  ? HLD (hyperlipidemia) 01/26/2020  ? RLS (restless legs syndrome) 01/01/2020  ? Mild intermittent asthma with acute exacerbation 01/01/2020  ? Frontal sinusitis 01/01/2020  ? Cigarette nicotine dependence without complication 33/82/5053  ? Lumbar herniated disc 06/24/2019  ? Insomnia 06/24/2019  ? Female pelvic pain 06/24/2019  ? Diarrhea 03/05/2019  ? Nausea and vomiting 03/05/2019  ? COVID-19 virus detected 02/17/2019  ? 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  ? Chronic sinusitis 04/04/2018  ? Allergic rhinitis 04/04/2018  ? Tobacco abuse 04/04/2018  ? Migraine without status migrainosus, not intractable 04/04/2018  ? Cardiac murmur 04/04/2018  ? Gastroesophageal reflux disease 04/04/2018  ? Prediabetes 04/04/2018  ? Fibroids 04/04/2018  ? ? ?Past Surgical History:  ?Procedure Laterality Date  ? BREAST SURGERY    ? left breast abcess  ? COLONOSCOPY WITH PROPOFOL N/A 09/17/2020  ? Procedure: COLONOSCOPY WITH PROPOFOL;  Surgeon: Jonathon Bellows, MD;  Location: [facility] ENDOSCOPY;  Service: Gastroenterology;  Laterality: N/A;  ? ESOPHAGOGASTRODUODENOSCOPY (EGD) WITH PROPOFOL N/A 09/17/2020  ? Procedure: ESOPHAGOGASTRODUODENOSCOPY (EGD) WITH PROPOFOL;  Surgeon: Jonathon Bellows, MD;  Location: [facility] ENDOSCOPY;  Service: Gastroenterology;  Laterality: N/A;  ? ? ?OB History   ?No obstetric history on file. ?  ? ? ? ?Home Medications   ? ?Prior to Admission medications   ?Medication Sig  Start Date End Date Taking? Authorizing Provider  ?diclofenac (VOLTAREN) 75 MG EC tablet Take 1 tablet (75 mg total) by mouth every 12 (twelve) hours as needed for moderate pain. 12/05/21  Yes Kalani Sthilaire, Nicholes Stairs, NP  ?albuterol (2.5 MG/3ML) 0.083% NEBU 3 mL, albuterol (5 MG/ML) 0.5% NEBU 0.5 mL Inhale into the lungs.    [provider]  ?albuterol (VENTOLIN HFA) 108 (90 Base) MCG/ACT inhaler Inhale 1-2 puffs into the lungs every 6 (six) hours  as needed. 11/09/21   McLean-Scocuzza, Nino Glow, MD  ?atorvastatin (LIPITOR) 10 MG tablet Take 1 tablet (10 mg total) by mouth daily. At night 11/09/21   McLean-Scocuzza, Nino Glow, MD  ?Cholecalciferol 1.25 MG (50000 UT) capsule Take 1 capsule (50,000 Units total) by mouth once a week. d3 11/09/21   McLean-Scocuzza, Nino Glow, MD  ?fluticasone (FLONASE) 50 MCG/ACT nasal spray Place 2 sprays into both nostrils daily. Prn max 2 sprays 05/11/21   McLean-Scocuzza, Nino Glow, MD  ?ipratropium-albuterol (DUONEB) 0.5-2.5 (3) MG/3ML SOLN Take 3 mLs by nebulization every 4 (four) hours as needed. 11/09/21   McLean-Scocuzza, Nino Glow, MD  ?loratadine (CLARITIN) 10 MG tablet Take 1 tablet (10 mg total) by mouth daily as needed for allergies. 11/09/21   McLean-Scocuzza, Nino Glow, MD  ?montelukast (SINGULAIR) 10 MG tablet Take 1 tablet (10 mg total) by mouth at bedtime. 11/09/21   McLean-Scocuzza, Nino Glow, MD  ?nicotine (NICODERM CQ - DOSED IN MG/24 HOURS) 14 mg/24hr patch Place 1 patch (14 mg total) onto the skin daily. 11/09/21   McLean-Scocuzza, Nino Glow, MD  ?nicotine (NICODERM CQ - DOSED IN MG/24 HOURS) 21 mg/24hr patch Place 1 patch (21 mg total) onto the skin daily. Then 14 then 7 use each x 1 month 11/09/21   McLean-Scocuzza, Nino Glow, MD  ?nicotine (NICODERM CQ - DOSED IN MG/24 HR) 7 mg/24hr patch Place 1 patch (7 mg total) onto the skin daily. 11/09/21   McLean-Scocuzza, Nino Glow, MD  ?omeprazole (PRILOSEC) 40 MG capsule Take 1 capsule (40 mg total) by mouth daily. 30 min before food 11/09/21   McLean-Scocuzza, Nino Glow, MD  ?ondansetron (ZOFRAN ODT) 4 MG disintegrating tablet Take 1 tablet (4 mg total) by mouth every 8 (eight) hours as needed for nausea or vomiting. 11/09/21   McLean-Scocuzza, Nino Glow, MD  ?tiZANidine (ZANAFLEX) 4 MG tablet Take 1 tablet (4 mg total) by mouth 2 (two) times daily as needed for muscle spasms. 11/09/21   McLean-Scocuzza, Nino Glow, MD  ?traZODone (DESYREL) 50 MG tablet Take 0.5-1 tablets (25-50 mg total) by mouth  at bedtime as needed for sleep. 11/09/21   McLean-Scocuzza, Nino Glow, MD  ?pantoprazole (PROTONIX) 40 MG tablet Take 1 tablet (40 mg total) by mouth daily. 30 min before 04/09/20 08/30/20  McLean-Scocuzza, Nino Glow, MD  ? ? ?Family History ?Family History  ?Problem Relation Age of Onset  ? Asthma Mother   ? COPD Mother   ? Diabetes Mother   ? Miscarriages / [place] Mother   ? Diabetes Father   ? Heart disease Father   ? Hypertension Father   ? Heart failure Father   ? Heart attack Father   ? Asthma Daughter   ? Miscarriages / [place] Daughter   ? Arthritis Maternal Grandmother   ? Asthma Maternal Grandmother   ? Hyperlipidemia Maternal Grandmother   ? Diabetes Maternal Grandfather   ? Stroke Paternal Grandmother   ? Heart disease Brother   ? ? ?Social History ?Social History  ? ?Tobacco  Use  ? Smoking status: Some Days  ?  Packs/day: 0.50  ?  Types: Cigarettes  ? Smokeless tobacco: Never  ? Tobacco comments:  ?  1 ppd   ?Vaping Use  ? Vaping Use: Never used  ?Substance Use Topics  ? Alcohol use: Yes  ?  Comment: occassionally  ? Drug use: No  ? ? ? ?Allergies   ?Shellfish allergy, Strawberry (diagnostic), and Tomato ? ? ?Review of Systems ?Review of Systems  ?Constitutional:  Negative for activity change, chills, fatigue and fever.  ?Respiratory:  Negative for chest tightness and shortness of breath.   ?Gastrointestinal:  Negative for nausea and vomiting.  ?Genitourinary:  Negative for decreased urine volume, difficulty urinating, flank pain and hematuria.  ?Musculoskeletal:  Positive for arthralgias, back pain and myalgias. Negative for gait problem, neck pain and neck stiffness.  ?Skin:  Negative for color change and rash.  ?Allergic/Immunologic: Positive for environmental allergies and food allergies.  ?Neurological:  Positive for numbness and headaches. Negative for dizziness, tremors, seizures, syncope and speech difficulty.  ?Hematological:  Negative for adenopathy. Does not bruise/bleed easily.   ?Psychiatric/Behavioral:  Positive for sleep disturbance.   ? ? ?Physical Exam ?Triage Vital Signs ?ED Triage Vitals  ?Enc Vitals Group  ?   BP 12/05/21 1939 (!) 152/84  ?   Pulse Rate 12/05/21 1939 77  ?   Resp --

## 2022-04-20 ENCOUNTER — Ambulatory Visit (INDEPENDENT_AMBULATORY_CARE_PROVIDER_SITE_OTHER): Payer: BC Managed Care – PPO | Admitting: Internal Medicine

## 2022-04-20 ENCOUNTER — Encounter: Payer: Self-pay | Admitting: Internal Medicine

## 2022-04-20 VITALS — BP 132/80 | HR 70 | Temp 98.0°F | Ht 60.0 in | Wt 141.0 lb

## 2022-04-20 DIAGNOSIS — J029 Acute pharyngitis, unspecified: Secondary | ICD-10-CM | POA: Diagnosis not present

## 2022-04-20 DIAGNOSIS — R350 Frequency of micturition: Secondary | ICD-10-CM

## 2022-04-20 DIAGNOSIS — K429 Umbilical hernia without obstruction or gangrene: Secondary | ICD-10-CM | POA: Diagnosis not present

## 2022-04-20 LAB — POCT RAPID STREP A (OFFICE): Rapid Strep A Screen: NEGATIVE

## 2022-04-20 MED ORDER — AZITHROMYCIN 250 MG PO TABS
ORAL_TABLET | ORAL | 0 refills | Status: AC
Start: 2022-04-20 — End: 2022-04-25

## 2022-04-20 NOTE — Progress Notes (Signed)
Chief Complaint  Patient presents with   Sore Throat    Pt states it hurts while swallowing on her right side   F/u  1. Sore throat with swallowing and right eye pain x 1.5 weeks throat is sore only on right side  hot cold chills strep negative pending covid she did have covid outbreak at work  2. Urinary freq and urgency check r/o uti today 3. Umbilical hernia not going in    Review of Systems  Constitutional:  Negative for weight loss.  HENT:  Positive for sinus pain and sore throat. Negative for hearing loss.   Eyes:  Negative for blurred vision.  Respiratory:  Negative for shortness of breath.   Cardiovascular:  Negative for chest pain.  Gastrointestinal:  Negative for abdominal pain and blood in stool.  Genitourinary:  Positive for frequency and urgency. Negative for dysuria.  Musculoskeletal:  Negative for falls and joint pain.  Skin:  Negative for rash.  Neurological:  Negative for headaches.  Psychiatric/Behavioral:  Negative for depression.    Past Medical History:  Diagnosis Date   Asthma    Back pain    Chronic sinusitis    GERD (gastroesophageal reflux disease)    Heart murmur    Leaky heart valve    Migraine    Migraine    Trichomonas infection    Past Surgical History:  Procedure Laterality Date   BREAST SURGERY     left breast abcess   COLONOSCOPY WITH PROPOFOL N/A 09/17/2020   Procedure: COLONOSCOPY WITH PROPOFOL;  Surgeon: Jonathon Bellows, MD;  Location: [facility] ENDOSCOPY;  Service: Gastroenterology;  Laterality: N/A;   ESOPHAGOGASTRODUODENOSCOPY (EGD) WITH PROPOFOL N/A 09/17/2020   Procedure: ESOPHAGOGASTRODUODENOSCOPY (EGD) WITH PROPOFOL;  Surgeon: Jonathon Bellows, MD;  Location: [facility] ENDOSCOPY;  Service: Gastroenterology;  Laterality: N/A;   Family History  Problem Relation Age of Onset   Asthma Mother    COPD Mother    Diabetes Mother    Miscarriages / [place] Mother    Diabetes Father    Heart disease Father    Hypertension Father    Heart failure  Father    Heart attack Father    Asthma Daughter    Miscarriages / [place] Daughter    Arthritis Maternal Grandmother    Asthma Maternal Grandmother    Hyperlipidemia Maternal Grandmother    Diabetes Maternal Grandfather    Stroke Paternal Grandmother    Heart disease Brother    Social History   Socioeconomic History   Marital status: Single    Spouse name: Not on file   Number of children: Not on file   Years of education: Not on file   Highest education level: Not on file  Occupational History   Not on file  Tobacco Use   Smoking status: Some Days    Packs/day: 0.50    Types: Cigarettes   Smokeless tobacco: Never   Tobacco comments:    1 ppd   Vaping Use   Vaping Use: Never used  Substance and Sexual Activity   Alcohol use: Yes    Comment: occassionally   Drug use: No   Sexual activity: Yes    Comment: men  Other Topics Concern   Not on file  Social History Narrative   GED   Works [employer]    2 kids    No guns, wears selt belt, safe in relationship    Social Determinants of Radio broadcast assistant Strain: Not on file  Food Insecurity: Not on file  Transportation Needs: Not on file  Physical Activity: Not on file  Stress: Not on file  Social Connections: Not on file  Intimate Partner Violence: Not on file   Current Meds  Medication Sig   azithromycin (ZITHROMAX) 250 MG tablet With food Take 2 tablets on day 1, then 1 tablet daily on days 2 through 5   Allergies  Allergen Reactions   Shellfish Allergy Itching   Strawberry (Diagnostic) Itching   Tomato Itching   Recent Results (from the past 2160 hour(s))  POCT rapid strep A     Status: Normal   Collection Time: 04/20/22  9:07 AM  Result Value Ref Range   Rapid Strep A Screen Negative Negative   Objective  Body mass index is 27.54 kg/m. Wt Readings from Last 3 Encounters:  04/20/22 141 lb (64 kg)  11/09/21 147 lb 9.6 oz (67 kg)  07/13/21 147 lb (66.7 kg)   Temp  Readings from Last 3 Encounters:  04/20/22 98 F (36.7 C) (Oral)  12/05/21 98.2 F (36.8 C) (Oral)  11/09/21 97.6 F (36.4 C) (Oral)   BP Readings from Last 3 Encounters:  04/20/22 132/80  12/05/21 (!) 152/84  11/09/21 112/70   Pulse Readings from Last 3 Encounters:  04/20/22 70  12/05/21 77  11/09/21 74    Physical Exam Vitals and nursing note reviewed.  Constitutional:      Appearance: Normal appearance. She is well-developed and well-groomed.  HENT:     Head: Normocephalic and atraumatic.     Mouth/Throat:     Pharynx: Posterior oropharyngeal erythema present.  Eyes:     Conjunctiva/sclera: Conjunctivae normal.     Pupils: Pupils are equal, round, and reactive to light.  Cardiovascular:     Rate and Rhythm: Normal rate and regular rhythm.     Heart sounds: Normal heart sounds. No murmur heard. Pulmonary:     Effort: Pulmonary effort is normal.     Breath sounds: Normal breath sounds.  Abdominal:     General: Abdomen is flat. Bowel sounds are normal.     Tenderness: There is no abdominal tenderness.  Musculoskeletal:        General: No tenderness.  Skin:    General: Skin is warm and dry.  Neurological:     General: No focal deficit present.     Mental Status: She is alert and oriented to person, place, and time. Mental status is at baseline.     Cranial Nerves: Cranial nerves 2-12 are intact.     Motor: Motor function is intact.     Coordination: Coordination is intact.     Gait: Gait is intact.  Psychiatric:        Attention and Perception: Attention and perception normal.        Mood and Affect: Mood and affect normal.        Speech: Speech normal.        Behavior: Behavior normal. Behavior is cooperative.        Thought Content: Thought content normal.        Cognition and Memory: Cognition and memory normal.        Judgment: Judgment normal.     Assessment  Plan  Sore throat - Plan: POCT rapid strep A, Novel Coronavirus, NAA ([facility]), azithromycin  (ZITHROMAX) 250 MG tablet,strep neg  If not better consider f/u ENT  Supportive care   Urinary frequency - Plan: Urine Culture, Urinalysis, Routine w reflex microscopic  Umbilical  hernia without obstruction and without gangrene - Plan: Ambulatory referral to General Surgery   HM F/u as sch Provider: Dr. Olivia Mackie McLean-Scocuzza-Internal Medicine

## 2022-04-20 NOTE — Patient Instructions (Addendum)
Dr. Lorenza [facility] clinic surgery  Sex Title Provider type Female MD Physician  Primary Contact Information  Phone Fax E-mail Address [phone] Not available [place] [address]   Specialties    General Surgery, Radiology    Umbilical Hernia, Adult  A hernia is a bulge of tissue that pushes through an opening between muscles. An umbilical hernia happens in the abdomen, near the belly button (umbilicus). The hernia may contain tissues from the small intestine, large intestine, or fatty tissue covering the intestines. Umbilical hernias in adults tend to get worse over time, and they require surgical treatment. There are different types of umbilical hernias, including: Indirect hernia. This type is located just above or below the umbilicus. It is the most common type of umbilical hernia in adults. Direct hernia. This type forms through an opening formed by the umbilicus. Reducible hernia. This type of hernia comes and goes. It may be visible only when you strain, lift something heavy, or cough. This type of hernia can be pushed back into the abdomen (reduced). Incarcerated hernia. This type traps abdominal tissue inside the hernia. This type of hernia cannot be reduced. Strangulated hernia. This type of hernia cuts off blood flow to the tissues inside the hernia. The tissues can start to die if this happens. This type of hernia requires emergency treatment. What are the causes? An umbilical hernia happens when tissue inside the abdomen presses on a weak area of the abdominal muscles. What increases the risk? You may have a greater risk of this condition if you: Are obese. Have had several pregnancies. Have a buildup of fluid inside your abdomen. Have had surgery that weakens the abdominal muscles. What are the signs or symptoms? The main symptom of this condition is a painless bulge at or near the belly button. A reducible hernia may be visible  only when you strain, lift something heavy, or cough. Other symptoms may include: Dull pain. A feeling of pressure. Symptoms of a strangulated hernia may include: Pain that gets increasingly worse. Nausea and vomiting. Pain when pressing on the hernia. Skin over the hernia becoming red or purple. Constipation. Blood in the stool. How is this diagnosed? This condition may be diagnosed based on: A physical exam. You may be asked to cough or strain while standing. These actions increase the pressure inside your abdomen and can force the hernia through the opening in your muscles. Your health care provider may try to reduce the hernia by pressing on it. Your symptoms and medical history. How is this treated? Surgery is the only treatment for an umbilical hernia. Surgery for a strangulated hernia is done as soon as possible. If you have a small hernia that is not incarcerated, you may need to lose weight before having surgery. Follow these instructions at home: Lose weight, if told by your health care provider. Do not try to push the hernia back in. Watch your hernia for any changes in color or size. Tell your health care provider if any changes occur. You may need to avoid activities that increase pressure on your hernia. Do not lift anything that is heavier than 10 lb (4.5 kg), or the limit that you are told, until your health care provider says that it is safe. Take over-the-counter and prescription medicines only as told by your health care provider. Keep all follow-up visits. This is important. Contact a health care provider if: Your hernia gets larger. Your hernia becomes painful. Get help  right away if: You develop sudden, severe pain near the area of your hernia. You have pain as well as nausea or vomiting. You have pain and the skin over your hernia changes color. You develop a fever or chills. Summary A hernia is a bulge of tissue that pushes through an opening between muscles.  An umbilical hernia happens near the belly button. Surgery is the only treatment for an umbilical hernia. Do not try to push your hernia back in. Keep all follow-up visits. This is important. This information is not intended to replace advice given to you by your health care provider. Make sure you discuss any questions you have with your health care provider. Document Revised: 02/16/2020 Document Reviewed: 02/16/2020 Elsevier Patient Education  [place].  If needing prescription strength medication we will need to make an appointment with a provider.  These are over the counter medication options:  Mucinex dm green label for cough or robitussin DM  Multivitamin or below vitamins  Vitamin C 1000 mg daily.  Vitamin D3 4000 Iu (units) daily.  Zinc 100 mg daily.  Quercetin 250-500 mg 2 times per day   Elderberry  Oil of oregano  cepacol or chloroseptic spray Warm salt water gargles +hydrogen peroxide Sugar free cough drops  Warm tea with honey and lemon  Hydration  Try to eat though you dont feel like it   Tylenol or Advil  Nasal saline and Flonase 2 sprays nasal congestion  If sneezing/runny nose over the counter allergy pill claritin,allegra, zyrtec, xyzal Quarantine x 10-14 days 14 days preferred   Monitor pulse oximeter, buy from [place] if oxygen is less than 90 please go to the hospital.        Are you feeling really sick? Shortness of breath, cough, chest pain?, dizziness? Confusion   If so let me know  If worsening, go to hospital or [facility] clinic Urgent care for further treatment.

## 2022-04-21 LAB — URINALYSIS, ROUTINE W REFLEX MICROSCOPIC
Bilirubin Urine: NEGATIVE
Glucose, UA: NEGATIVE
Hgb urine dipstick: NEGATIVE
Ketones, ur: NEGATIVE
Leukocytes,Ua: NEGATIVE
Nitrite: NEGATIVE
Protein, ur: NEGATIVE
Specific Gravity, Urine: 1.013 (ref 1.001–1.035)
pH: 5.5 (ref 5.0–8.0)

## 2022-04-21 LAB — URINE CULTURE
MICRO NUMBER:: 13981476
SPECIMEN QUALITY:: ADEQUATE

## 2022-04-21 LAB — NOVEL CORONAVIRUS, NAA: SARS-CoV-2, NAA: NOT DETECTED

## 2022-04-25 ENCOUNTER — Ambulatory Visit: Payer: BC Managed Care – PPO | Admitting: Internal Medicine

## 2022-04-26 ENCOUNTER — Ambulatory Visit: Payer: BC Managed Care – PPO | Admitting: Internal Medicine

## 2022-05-03 ENCOUNTER — Encounter: Payer: Self-pay | Admitting: Internal Medicine

## 2022-05-03 ENCOUNTER — Ambulatory Visit (INDEPENDENT_AMBULATORY_CARE_PROVIDER_SITE_OTHER): Payer: BC Managed Care – PPO | Admitting: Internal Medicine

## 2022-05-03 ENCOUNTER — Other Ambulatory Visit (HOSPITAL_COMMUNITY)
Admission: RE | Admit: 2022-05-03 | Discharge: 2022-05-03 | Disposition: A | Discharge status: Home / Self Care | Payer: BC Managed Care – PPO | Source: Ambulatory Visit | Attending: Physician Assistant | Admitting: Physician Assistant

## 2022-05-03 VITALS — BP 122/70 | HR 72 | Temp 97.7°F | Ht 60.0 in | Wt 142.6 lb

## 2022-05-03 DIAGNOSIS — Z23 Encounter for immunization: Secondary | ICD-10-CM | POA: Diagnosis not present

## 2022-05-03 DIAGNOSIS — B3731 Acute candidiasis of vulva and vagina: Secondary | ICD-10-CM | POA: Insufficient documentation

## 2022-05-03 DIAGNOSIS — Z124 Encounter for screening for malignant neoplasm of cervix: Secondary | ICD-10-CM | POA: Insufficient documentation

## 2022-05-03 DIAGNOSIS — N841 Polyp of cervix uteri: Secondary | ICD-10-CM

## 2022-05-03 DIAGNOSIS — N760 Acute vaginitis: Secondary | ICD-10-CM | POA: Insufficient documentation

## 2022-05-03 NOTE — Patient Instructions (Signed)
[facility] ob/gyn  Phone Fax E-mail Address  [phone] Not available [place]   [place] [address]     Specialties     Obstetrics and Gynecology            Call and schedule mammogram

## 2022-05-03 NOTE — Progress Notes (Signed)
Chief Complaint  Patient presents with   Gynecologic Exam   F/u  Pap smear due c/o fishy odor     Review of Systems  Constitutional:  Negative for weight loss.  HENT:  Negative for hearing loss.   Eyes:  Negative for blurred vision.  Respiratory:  Negative for shortness of breath.   Cardiovascular:  Negative for chest pain.  Gastrointestinal:  Negative for abdominal pain and blood in stool.  Genitourinary:  Negative for dysuria.  Musculoskeletal:  Negative for falls and joint pain.  Skin:  Negative for rash.  Neurological:  Negative for headaches.  Psychiatric/Behavioral:  Negative for depression.    Past Medical History:  Diagnosis Date   Asthma    Back pain    Chronic sinusitis    GERD (gastroesophageal reflux disease)    Heart murmur    Leaky heart valve    Migraine    Migraine    Trichomonas infection    Past Surgical History:  Procedure Laterality Date   BREAST SURGERY     left breast abcess   COLONOSCOPY WITH PROPOFOL N/A 09/17/2020   Procedure: COLONOSCOPY WITH PROPOFOL;  Surgeon: Jonathon Bellows, MD;  Location: [facility] ENDOSCOPY;  Service: Gastroenterology;  Laterality: N/A;   ESOPHAGOGASTRODUODENOSCOPY (EGD) WITH PROPOFOL N/A 09/17/2020   Procedure: ESOPHAGOGASTRODUODENOSCOPY (EGD) WITH PROPOFOL;  Surgeon: Jonathon Bellows, MD;  Location: [facility] ENDOSCOPY;  Service: Gastroenterology;  Laterality: N/A;   Family History  Problem Relation Age of Onset   Asthma Mother    COPD Mother    Diabetes Mother    Miscarriages / [place] Mother    Diabetes Father    Heart disease Father    Hypertension Father    Heart failure Father    Heart attack Father    Asthma Daughter    Miscarriages / [place] Daughter    Arthritis Maternal Grandmother    Asthma Maternal Grandmother    Hyperlipidemia Maternal Grandmother    Diabetes Maternal Grandfather    Stroke Paternal Grandmother    Heart disease Brother    Social History   Socioeconomic History   Marital status: Single     Spouse name: Not on file   Number of children: Not on file   Years of education: Not on file   Highest education level: Not on file  Occupational History   Not on file  Tobacco Use   Smoking status: Some Days    Packs/day: 0.50    Types: Cigarettes   Smokeless tobacco: Never   Tobacco comments:    1 ppd   Vaping Use   Vaping Use: Never used  Substance and Sexual Activity   Alcohol use: Yes    Comment: occassionally   Drug use: No   Sexual activity: Yes    Comment: men  Other Topics Concern   Not on file  Social History Narrative   GED   Works [employer]    2 kids    No guns, wears selt belt, safe in relationship    Social Determinants of Radio broadcast assistant Strain: Not on file  Food Insecurity: Not on file  Transportation Needs: Not on file  Physical Activity: Not on file  Stress: Not on file  Social Connections: Not on file  Intimate Partner Violence: Not on file   Current Meds  Medication Sig   albuterol (2.5 MG/3ML) 0.083% NEBU 3 mL, albuterol (5 MG/ML) 0.5% NEBU 0.5 mL Inhale into the lungs.   albuterol (VENTOLIN HFA)  108 (90 Base) MCG/ACT inhaler Inhale 1-2 puffs into the lungs every 6 (six) hours as needed.   atorvastatin (LIPITOR) 10 MG tablet Take 1 tablet (10 mg total) by mouth daily. At night   Cholecalciferol 1.25 MG (50000 UT) capsule Take 1 capsule (50,000 Units total) by mouth once a week. d3   diclofenac (VOLTAREN) 75 MG EC tablet Take 1 tablet (75 mg total) by mouth every 12 (twelve) hours as needed for moderate pain.   fluticasone (FLONASE) 50 MCG/ACT nasal spray Place 2 sprays into both nostrils daily. Prn max 2 sprays   ipratropium-albuterol (DUONEB) 0.5-2.5 (3) MG/3ML SOLN Take 3 mLs by nebulization every 4 (four) hours as needed.   loratadine (CLARITIN) 10 MG tablet Take 1 tablet (10 mg total) by mouth daily as needed for allergies.   montelukast (SINGULAIR) 10 MG tablet Take 1 tablet (10 mg total) by mouth at bedtime.    nicotine (NICODERM CQ - DOSED IN MG/24 HOURS) 14 mg/24hr patch Place 1 patch (14 mg total) onto the skin daily.   nicotine (NICODERM CQ - DOSED IN MG/24 HOURS) 21 mg/24hr patch Place 1 patch (21 mg total) onto the skin daily. Then 14 then 7 use each x 1 month   nicotine (NICODERM CQ - DOSED IN MG/24 HR) 7 mg/24hr patch Place 1 patch (7 mg total) onto the skin daily.   omeprazole (PRILOSEC) 40 MG capsule Take 1 capsule (40 mg total) by mouth daily. 30 min before food   ondansetron (ZOFRAN ODT) 4 MG disintegrating tablet Take 1 tablet (4 mg total) by mouth every 8 (eight) hours as needed for nausea or vomiting.   tiZANidine (ZANAFLEX) 4 MG tablet Take 1 tablet (4 mg total) by mouth 2 (two) times daily as needed for muscle spasms.   traZODone (DESYREL) 50 MG tablet Take 0.5-1 tablets (25-50 mg total) by mouth at bedtime as needed for sleep.   Allergies  Allergen Reactions   Shellfish Allergy Itching   Strawberry (Diagnostic) Itching   Tomato Itching   Recent Results (from the past 2160 hour(s))  Novel Coronavirus, NAA ([facility])     Status: None   Collection Time: 04/20/22  9:03 AM   Specimen: Nasopharyngeal(NP) swabs in vial transport medium   Nasopharynge  Previous  Result Value Ref Range   SARS-CoV-2, NAA Not Detected Not Detected    Comment: This nucleic acid amplification test was developed and its performance characteristics determined by [employer]. Nucleic acid amplification tests include RT-PCR and TMA. This test has not been FDA cleared or approved. This test has been authorized by FDA under an Emergency Use Authorization (EUA). This test is only authorized for the duration of time the declaration that circumstances exist justifying the authorization of the emergency use of in vitro diagnostic tests for detection of SARS-CoV-2 virus and/or diagnosis of COVID-19 infection under section 564(b)(1) of the Act, 21 U.S.C. 321YYQ-8(G) (1), unless the authorization is terminated  or revoked sooner. When diagnostic testing is negative, the possibility of a false negative result should be considered in the context of a patient's recent exposures and the presence of clinical signs and symptoms consistent with COVID-19. An individual without symptoms of COVID-19 and who is not shedding SARS-CoV-2 virus wo uld expect to have a negative (not detected) result in this assay.   POCT rapid strep A     Status: Normal   Collection Time: 04/20/22  9:07 AM  Result Value Ref Range   Rapid Strep A Screen Negative Negative  Urine Culture     Status: None   Collection Time: 04/20/22 11:43 AM   Specimen: Urine  Result Value Ref Range   MICRO NUMBER: [id number]    SPECIMEN QUALITY: Adequate    Sample Source URINE, CLEAN CATCH    STATUS: FINAL    Result:      Mixed genital flora isolated. These superficial bacteria are not indicative of a urinary tract infection. No further organism identification is warranted on this specimen. If clinically indicated, recollect clean-catch, mid-stream urine and transfer  immediately to Urine Culture Transport Tube.   Urinalysis, Routine w reflex microscopic     Status: None   Collection Time: 04/20/22 11:43 AM  Result Value Ref Range   Color, Urine YELLOW YELLOW   APPearance CLEAR CLEAR   Specific Gravity, Urine 1.013 1.001 - 1.035   pH 5.5 5.0 - 8.0   Glucose, UA NEGATIVE NEGATIVE   Bilirubin Urine NEGATIVE NEGATIVE   Ketones, ur NEGATIVE NEGATIVE   Hgb urine dipstick NEGATIVE NEGATIVE   Protein, ur NEGATIVE NEGATIVE   Nitrite NEGATIVE NEGATIVE   Leukocytes,Ua NEGATIVE NEGATIVE   Objective  Body mass index is 27.85 kg/m. Wt Readings from Last 3 Encounters:  05/03/22 142 lb 9.6 oz (64.7 kg)  04/20/22 141 lb (64 kg)  11/09/21 147 lb 9.6 oz (67 kg)   Temp Readings from Last 3 Encounters:  05/03/22 97.7 F (36.5 C) (Oral)  04/20/22 98 F (36.7 C) (Oral)  12/05/21 98.2 F (36.8 C) (Oral)   BP Readings from Last 3 Encounters:   05/03/22 122/70  04/20/22 132/80  12/05/21 (!) 152/84   Pulse Readings from Last 3 Encounters:  05/03/22 72  04/20/22 70  12/05/21 77    Physical Exam Vitals and nursing note reviewed.  Constitutional:      Appearance: Normal appearance. She is well-developed and well-groomed.  HENT:     Head: Normocephalic and atraumatic.  Eyes:     Conjunctiva/sclera: Conjunctivae normal.     Pupils: Pupils are equal, round, and reactive to light.  Cardiovascular:     Rate and Rhythm: Normal rate and regular rhythm.     Heart sounds: Normal heart sounds. No murmur heard. Pulmonary:     Effort: Pulmonary effort is normal.     Breath sounds: Normal breath sounds.  Abdominal:     General: Abdomen is flat. Bowel sounds are normal.     Tenderness: There is no abdominal tenderness.     Hernia: There is no hernia in the left inguinal area or right inguinal area.  Genitourinary:    Pubic Area: No rash.      Labia:        Right: No rash.        Left: No rash.      Vagina: Normal.     Cervix: Discharge present.     Uterus: Normal.      Adnexa: Right adnexa normal and left adnexa normal.  Musculoskeletal:        General: No tenderness.  Skin:    General: Skin is warm and dry.  Neurological:     General: No focal deficit present.     Mental Status: She is alert and oriented to person, place, and time. Mental status is at baseline.     Cranial Nerves: Cranial nerves 2-12 are intact.     Motor: Motor function is intact.     Coordination: Coordination is intact.     Gait: Gait is intact.  Psychiatric:  Attention and Perception: Attention and perception normal.        Mood and Affect: Mood and affect normal.        Speech: Speech normal.        Behavior: Behavior normal. Behavior is cooperative.        Thought Content: Thought content normal.        Cognition and Memory: Cognition and memory normal.        Judgment: Judgment normal.     Assessment  Plan  Acute vaginitis - Plan:  Cervicovaginal ancillary only( [place]) Routine cervical smear - Plan: Cytology - PAP( [place])  Cervical polyp - Plan: Ambulatory referral to Obstetrics / Gynecology   HM Surgery appt 89/78/47 [facility] Surgery umbilical hernia  Flu shot given covid 2/2 pfizer  Tdap ? Date review prior records  Consider MMR vaccine and hep B vaccine  Hep C neg Consider shingirx    Per pt had HIV test [facility] requested records today    Referred mammogram [place] call to schedule      04/25/18 pap negative pap also h/o fibroids, enlarged cervix, adenaxal mass, trichomonas, mild endometrial thickening referred to Dr. Georgianne Fick bx endometrium neg 05/02/18  Pap today  Consider HIV testing in the future   Colonoscopy 09/17/20 f/u in 10 years   Smoker 1ppd now <1/2 Rx nicotine gum rec smoking cessation As of 10/2021 smoking 5 cigs/qd    Provider: Dr. Olivia Mackie McLean-Scocuzza-Internal Medicine

## 2022-05-04 ENCOUNTER — Other Ambulatory Visit: Payer: Self-pay | Admitting: Internal Medicine

## 2022-05-04 DIAGNOSIS — K429 Umbilical hernia without obstruction or gangrene: Secondary | ICD-10-CM | POA: Diagnosis not present

## 2022-05-04 DIAGNOSIS — B379 Candidiasis, unspecified: Secondary | ICD-10-CM

## 2022-05-04 DIAGNOSIS — N760 Acute vaginitis: Secondary | ICD-10-CM

## 2022-05-04 LAB — CERVICOVAGINAL ANCILLARY ONLY
Bacterial Vaginitis (gardnerella): POSITIVE — AB
Candida Glabrata: NEGATIVE
Candida Vaginitis: POSITIVE — AB
Chlamydia: NEGATIVE
Comment: NEGATIVE
Comment: NEGATIVE
Comment: NEGATIVE
Comment: NEGATIVE
Comment: NEGATIVE
Comment: NORMAL
Neisseria Gonorrhea: NEGATIVE
Trichomonas: NEGATIVE

## 2022-05-04 LAB — CYTOLOGY - PAP
Comment: NEGATIVE
Diagnosis: NEGATIVE
High risk HPV: NEGATIVE

## 2022-05-04 MED ORDER — FLUCONAZOLE 150 MG PO TABS: 150.0000 mg | ORAL_TABLET | Freq: Once | ORAL | 0 refills | Status: AC

## 2022-05-04 MED ORDER — METRONIDAZOLE 500 MG PO TABS: 500.0000 mg | ORAL_TABLET | Freq: Two times a day (BID) | ORAL | 0 refills | Status: DC

## 2022-05-05 ENCOUNTER — Ambulatory Visit: Payer: Self-pay | Admitting: General Surgery

## 2022-05-05 NOTE — H&P (Signed)
PATIENT PROFILE: Teresa Deleon is a 56 y.o. female who presents to the Clinic for consultation at the request of Dr. Terese Door for evaluation of umbilical hernia.  PCP:  McLean-Scocuzza, Ginger Organ, MD  HISTORY OF PRESENT ILLNESS: Teresa Deleon reports having umbilical hernia for a while.  She endorses that the last few months he has been getting more symptomatic.  She endorses pain on the paramedical area.  Pain radiates to both sides of the umbilical area.  Pain aggravated by heavy lifting.  No alleviating factors.  She denies any episode of abdominal distention, nausea or vomiting.  She had a CT scan on 02/06/2018 that shows some medical hernia with fat content.  She endorses that initially she was able to reduce this hernia but in the last month she has not been able to reduce the hernia.   PROBLEM LIST: Problem List  Date Reviewed: 05/17/2018  None   GENERAL REVIEW OF SYSTEMS:   General ROS: negative for - chills, fatigue, fever, weight gain or weight loss Allergy and Immunology ROS: negative for - hives  Hematological and Lymphatic ROS: negative for - bleeding problems or bruising, negative for palpable nodes Endocrine ROS: negative for - heat or cold intolerance, hair changes Respiratory ROS: negative for - cough, shortness of breath or wheezing Cardiovascular ROS: no chest pain or palpitations GI ROS: negative for nausea, vomiting, abdominal pain, diarrhea, constipation Musculoskeletal ROS: negative for - joint swelling or muscle pain Neurological ROS: negative for - confusion, syncope Dermatological ROS: negative for pruritus and rash Psychiatric: negative for anxiety, depression, difficulty sleeping and memory loss  MEDICATIONS: Current Outpatient Medications  Medication Sig Dispense Refill   albuterol 90 mcg/actuation inhaler Inhale into the lungs every 4 (four) hours as needed     atorvastatin (LIPITOR) 10 MG tablet Take 10 mg by mouth once daily      cholecalciferol (VITAMIN D3) 1,250 mcg (50,000 unit) capsule Take 50,000 Units by mouth once a week     diclofenac (VOLTAREN) 75 MG EC tablet Take by mouth 2 (two) times daily as needed     fluticasone propionate (FLONASE) 50 mcg/actuation nasal spray Place 2 sprays into one nostril once daily     ipratropium-albuteroL (DUO-NEB) nebulizer solution Take 3 mLs by nebulization 4 (four) times daily as needed     loratadine (CLARITIN) 10 mg tablet Take 10 mg by mouth once daily     montelukast (SINGULAIR) 10 mg tablet Take 10 mg by mouth at bedtime     omeprazole (PRILOSEC) 40 MG DR capsule Take 40 mg by mouth once daily     ondansetron (ZOFRAN-ODT) 4 MG disintegrating tablet Take 4 mg by mouth every 8 (eight) hours as needed     tiZANidine (ZANAFLEX) 4 MG tablet Take 4 mg by mouth every 8 (eight) hours as needed     traZODone (DESYREL) 50 MG tablet Take 50 mg by mouth at bedtime     gabapentin (NEURONTIN) 300 MG capsule Take 1 capsule (300 mg total) by mouth 2 (two) times daily 1 po qHS x 4 days, then bid as tolerated (Patient not taking: Reported on 05/04/2022) 60 capsule 1   No current facility-administered medications for this visit.    ALLERGIES: Shellfish containing products  PAST MEDICAL HISTORY: Past Medical History:  Diagnosis Date   Allergic rhinitis    Asthma    Cardiac murmur    Cervical mass    Chronic sinusitis    Endometrial thickening on ultrasound    Fibroids  GERD (gastroesophageal reflux disease)    Lumbar pain    herniated disk   Lumbar radiculitis    Migraine without status migrainosus, not intractable    Post-menopausal bleeding    RLS (restless legs syndrome)    Sciatic leg pain    Tobacco abuse    Vitamin D deficiency     PAST SURGICAL HISTORY: History reviewed. No pertinent surgical history.   FAMILY HISTORY: Family History  Problem Relation Age of Onset   Diabetes Mother    Myocardial Infarction (Heart attack) Father    Lymphoma Maternal Uncle       SOCIAL HISTORY: Social History   Socioeconomic History   Marital status: Single  Tobacco Use   Smoking status: Every Day    Types: Cigarettes   Smokeless tobacco: Never  Vaping Use   Vaping Use: Never used  Substance and Sexual Activity   Alcohol use: Yes    Comment: occasionally    PHYSICAL EXAM: Vitals:   05/04/22 0816  BP: 129/80  Pulse: 67   Body mass index is 27.73 kg/m. Weight: 64.4 kg (142 lb)   GENERAL: Alert, active, oriented x3  HEENT: Pupils equal reactive to light. Extraocular movements are intact. Sclera clear. Palpebral conjunctiva normal red color.Pharynx clear.  NECK: Supple with no palpable mass and no adenopathy.  LUNGS: Sound clear with no rales rhonchi or wheezes.  HEART: Regular rhythm S1 and S2 without murmur.  ABDOMEN: Soft and depressible, nontender with no palpable mass, no hepatomegaly.  Umbilical hernia, reducible.  No skin changes.  EXTREMITIES: Well-developed well-nourished symmetrical with no dependent edema.  NEUROLOGICAL: Awake alert oriented, facial expression symmetrical, moving all extremities.  REVIEW OF DATA: I have reviewed the following data today: Initial consult on 05/04/2022  Component Date Value   Glucose 05/04/2022 95    Sodium 05/04/2022 143    Potassium 05/04/2022 4.1    Chloride 05/04/2022 110 (H)    Carbon Dioxide (CO2) 05/04/2022 29.7    Urea Nitrogen (BUN) 05/04/2022 9    Creatinine 05/04/2022 0.9    Glomerular Filtration Ra* 05/04/2022 74    Calcium 05/04/2022 9.5    AST  05/04/2022 11    ALT  05/04/2022 9    Alk Phos (alkaline Phosp* 05/04/2022 111 (H)    Albumin 05/04/2022 4.4    Bilirubin, Total 05/04/2022 0.5    Protein, Total 05/04/2022 6.8    A/G Ratio 05/04/2022 1.8    WBC (White Blood Cell Co* 05/04/2022 5.6    RBC (Red Blood Cell Coun* 05/04/2022 4.66    Hemoglobin 05/04/2022 14.2    Hematocrit 05/04/2022 42.0    MCV (Mean Corpuscular Vo* 05/04/2022 90.1    MCH (Mean Corpuscular He*  05/04/2022 30.5    MCHC (Mean Corpuscular H* 05/04/2022 33.8    Platelet Count 05/04/2022 317    RDW-CV (Red Cell Distrib* 05/04/2022 13.0    MPV (Mean Platelet Volum* 05/04/2022 8.4 (L)    Neutrophils 05/04/2022 2.82    Lymphocytes 05/04/2022 2.07    Monocytes 05/04/2022 0.46    Eosinophils 05/04/2022 0.16    Basophils 05/04/2022 0.03    Neutrophil % 05/04/2022 50.8    Lymphocyte % 05/04/2022 37.3    Monocyte % 05/04/2022 8.3    Eosinophil % 05/04/2022 2.9    Basophil% 05/04/2022 0.5    Immature Granulocyte % 05/04/2022 0.2    Immature Granulocyte Cou* 05/04/2022 0.01      ASSESSMENT: Teresa Deleon is a 56 y.o. female presenting for consultation for umbilical hernia.  The patient presents with a symptomatic incarcerated umbilical hernia. Patient was oriented about the diagnosis of umbilical hernia and its implication. The patient was oriented about the treatment alternatives (observation vs surgical repair). Due to patient symptoms, repair is recommended. Patient oriented about the surgical procedure, the use of mesh and its risk of complications such as: infection, bleeding, injury to vasculature, injury to bowel or bladder, and chronic pain, intestinal obstruction, among others.   Umbilical hernia without obstruction and without gangrene [K42.9]  PLAN: Robotic assisted laparoscopic umbilical hernia repair with mesh (76720) CBC, CMP Avoid taking aspirin 5 days before surgery Encourage to stop smoking Contact us if you have any concern.   Patient verbalized understanding, all questions were answered, and were agreeable with the plan outlined above.    Herbert Pun, MD  Electronically signed by Herbert Pun, MD

## 2022-05-05 NOTE — H&P (View-Only) (Signed)
PATIENT PROFILE: Teresa Deleon is a 56 y.o. female who presents to the Clinic for consultation at the request of Dr. Terese Door for evaluation of umbilical hernia.  PCP:  McLean-Scocuzza, Ginger Organ, MD  HISTORY OF PRESENT ILLNESS: Teresa Deleon reports having umbilical hernia for a while.  She endorses that the last few months he has been getting more symptomatic.  She endorses pain on the paramedical area.  Pain radiates to both sides of the umbilical area.  Pain aggravated by heavy lifting.  No alleviating factors.  She denies any episode of abdominal distention, nausea or vomiting.  She had a CT scan on 02/06/2018 that shows some medical hernia with fat content.  She endorses that initially she was able to reduce this hernia but in the last month she has not been able to reduce the hernia.   PROBLEM LIST: Problem List  Date Reviewed: 05/17/2018  None   GENERAL REVIEW OF SYSTEMS:   General ROS: negative for - chills, fatigue, fever, weight gain or weight loss Allergy and Immunology ROS: negative for - hives  Hematological and Lymphatic ROS: negative for - bleeding problems or bruising, negative for palpable nodes Endocrine ROS: negative for - heat or cold intolerance, hair changes Respiratory ROS: negative for - cough, shortness of breath or wheezing Cardiovascular ROS: no chest pain or palpitations GI ROS: negative for nausea, vomiting, abdominal pain, diarrhea, constipation Musculoskeletal ROS: negative for - joint swelling or muscle pain Neurological ROS: negative for - confusion, syncope Dermatological ROS: negative for pruritus and rash Psychiatric: negative for anxiety, depression, difficulty sleeping and memory loss  MEDICATIONS: Current Outpatient Medications  Medication Sig Dispense Refill   albuterol 90 mcg/actuation inhaler Inhale into the lungs every 4 (four) hours as needed     atorvastatin (LIPITOR) 10 MG tablet Take 10 mg by mouth once daily      cholecalciferol (VITAMIN D3) 1,250 mcg (50,000 unit) capsule Take 50,000 Units by mouth once a week     diclofenac (VOLTAREN) 75 MG EC tablet Take by mouth 2 (two) times daily as needed     fluticasone propionate (FLONASE) 50 mcg/actuation nasal spray Place 2 sprays into one nostril once daily     ipratropium-albuteroL (DUO-NEB) nebulizer solution Take 3 mLs by nebulization 4 (four) times daily as needed     loratadine (CLARITIN) 10 mg tablet Take 10 mg by mouth once daily     montelukast (SINGULAIR) 10 mg tablet Take 10 mg by mouth at bedtime     omeprazole (PRILOSEC) 40 MG DR capsule Take 40 mg by mouth once daily     ondansetron (ZOFRAN-ODT) 4 MG disintegrating tablet Take 4 mg by mouth every 8 (eight) hours as needed     tiZANidine (ZANAFLEX) 4 MG tablet Take 4 mg by mouth every 8 (eight) hours as needed     traZODone (DESYREL) 50 MG tablet Take 50 mg by mouth at bedtime     gabapentin (NEURONTIN) 300 MG capsule Take 1 capsule (300 mg total) by mouth 2 (two) times daily 1 po qHS x 4 days, then bid as tolerated (Patient not taking: Reported on 05/04/2022) 60 capsule 1   No current facility-administered medications for this visit.    ALLERGIES: Shellfish containing products  PAST MEDICAL HISTORY: Past Medical History:  Diagnosis Date   Allergic rhinitis    Asthma    Cardiac murmur    Cervical mass    Chronic sinusitis    Endometrial thickening on ultrasound    Fibroids  GERD (gastroesophageal reflux disease)    Lumbar pain    herniated disk   Lumbar radiculitis    Migraine without status migrainosus, not intractable    Post-menopausal bleeding    RLS (restless legs syndrome)    Sciatic leg pain    Tobacco abuse    Vitamin D deficiency     PAST SURGICAL HISTORY: History reviewed. No pertinent surgical history.   FAMILY HISTORY: Family History  Problem Relation Age of Onset   Diabetes Mother    Myocardial Infarction (Heart attack) Father    Lymphoma Maternal Uncle       SOCIAL HISTORY: Social History   Socioeconomic History   Marital status: Single  Tobacco Use   Smoking status: Every Day    Types: Cigarettes   Smokeless tobacco: Never  Vaping Use   Vaping Use: Never used  Substance and Sexual Activity   Alcohol use: Yes    Comment: occasionally    PHYSICAL EXAM: Vitals:   05/04/22 0816  BP: 129/80  Pulse: 67   Body mass index is 27.73 kg/m. Weight: 64.4 kg (142 lb)   GENERAL: Alert, active, oriented x3  HEENT: Pupils equal reactive to light. Extraocular movements are intact. Sclera clear. Palpebral conjunctiva normal red color.Pharynx clear.  NECK: Supple with no palpable mass and no adenopathy.  LUNGS: Sound clear with no rales rhonchi or wheezes.  HEART: Regular rhythm S1 and S2 without murmur.  ABDOMEN: Soft and depressible, nontender with no palpable mass, no hepatomegaly.  Umbilical hernia, reducible.  No skin changes.  EXTREMITIES: Well-developed well-nourished symmetrical with no dependent edema.  NEUROLOGICAL: Awake alert oriented, facial expression symmetrical, moving all extremities.  REVIEW OF DATA: I have reviewed the following data today: Initial consult on 05/04/2022  Component Date Value   Glucose 05/04/2022 95    Sodium 05/04/2022 143    Potassium 05/04/2022 4.1    Chloride 05/04/2022 110 (H)    Carbon Dioxide (CO2) 05/04/2022 29.7    Urea Nitrogen (BUN) 05/04/2022 9    Creatinine 05/04/2022 0.9    Glomerular Filtration Ra* 05/04/2022 74    Calcium 05/04/2022 9.5    AST  05/04/2022 11    ALT  05/04/2022 9    Alk Phos (alkaline Phosp* 05/04/2022 111 (H)    Albumin 05/04/2022 4.4    Bilirubin, Total 05/04/2022 0.5    Protein, Total 05/04/2022 6.8    A/G Ratio 05/04/2022 1.8    WBC (White Blood Cell Co* 05/04/2022 5.6    RBC (Red Blood Cell Coun* 05/04/2022 4.66    Hemoglobin 05/04/2022 14.2    Hematocrit 05/04/2022 42.0    MCV (Mean Corpuscular Vo* 05/04/2022 90.1    MCH (Mean Corpuscular He*  05/04/2022 30.5    MCHC (Mean Corpuscular H* 05/04/2022 33.8    Platelet Count 05/04/2022 317    RDW-CV (Red Cell Distrib* 05/04/2022 13.0    MPV (Mean Platelet Volum* 05/04/2022 8.4 (L)    Neutrophils 05/04/2022 2.82    Lymphocytes 05/04/2022 2.07    Monocytes 05/04/2022 0.46    Eosinophils 05/04/2022 0.16    Basophils 05/04/2022 0.03    Neutrophil % 05/04/2022 50.8    Lymphocyte % 05/04/2022 37.3    Monocyte % 05/04/2022 8.3    Eosinophil % 05/04/2022 2.9    Basophil% 05/04/2022 0.5    Immature Granulocyte % 05/04/2022 0.2    Immature Granulocyte Cou* 05/04/2022 0.01      ASSESSMENT: Teresa Deleon is a 56 y.o. female presenting for consultation for umbilical hernia.  The patient presents with a symptomatic incarcerated umbilical hernia. Patient was oriented about the diagnosis of umbilical hernia and its implication. The patient was oriented about the treatment alternatives (observation vs surgical repair). Due to patient symptoms, repair is recommended. Patient oriented about the surgical procedure, the use of mesh and its risk of complications such as: infection, bleeding, injury to vasculature, injury to bowel or bladder, and chronic pain, intestinal obstruction, among others.   Umbilical hernia without obstruction and without gangrene [K42.9]  PLAN: Robotic assisted laparoscopic umbilical hernia repair with mesh (27035) CBC, CMP Avoid taking aspirin 5 days before surgery Encourage to stop smoking Contact us if you have any concern.   Patient verbalized understanding, all questions were answered, and were agreeable with the plan outlined above.    Herbert Pun, MD  Electronically signed by Herbert Pun, MD

## 2022-05-08 ENCOUNTER — Encounter
Admission: RE | Admit: 2022-05-08 | Discharge: 2022-05-08 | Disposition: A | Discharge status: Home / Self Care | Payer: BC Managed Care – PPO | Source: Ambulatory Visit | Attending: General Surgery | Admitting: General Surgery

## 2022-05-08 ENCOUNTER — Other Ambulatory Visit: Payer: Self-pay

## 2022-05-08 HISTORY — DX: Prediabetes: R73.03

## 2022-05-08 NOTE — Patient Instructions (Signed)
Your procedure is scheduled on: 05/12/22 Report to [facility]. To find out your arrival time please call [phone] between 1PM - 3PM on 05/11/22.  Remember: Instructions that are not followed completely may result in serious medical risk, up to and including death, or upon the discretion of your surgeon and anesthesiologist your surgery may need to be rescheduled.     _X__ 1. Do not eat food or drink any liquids after midnight the night before your procedure.                 No gum chewing or hard candies.   __X__2.  On the morning of surgery brush your teeth with toothpaste and water, you                 may rinse your mouth with mouthwash if you wish.  Do not swallow any              toothpaste of mouthwash.     _X__ 3.  No Alcohol for 24 hours before or after surgery.   _X__ 4.  Do Not Smoke or use e-cigarettes For 24 Hours Prior to Your Surgery.                 Do not use any chewable tobacco products for at least 6 hours prior to                 surgery.  ____  5.  Bring all medications with you on the day of surgery if instructed.   __X__  6.  Notify your doctor if there is any change in your medical condition      (cold, fever, infections).     Do not wear jewelry, make-up, hairpins, clips or nail polish. Do not wear lotions, powders, or perfumes.  Do not shave body hair 48 hours prior to surgery. Men may shave face and neck. Do not bring valuables to the hospital.    [facility] is not responsible for any belongings or valuables.  Contacts, dentures/partials or body piercings may not be worn into surgery. Bring a case for your contacts, glasses or hearing aids, a denture cup will be supplied. Leave your suitcase in the car. After surgery it may be brought to your room. For patients admitted to the hospital, discharge time is determined by your treatment team.   Patients discharged the day of surgery will not be  allowed to drive home.    __X__ Take these medicines the morning of surgery with A SIP OF WATER:    1. omeprazole (PRILOSEC) 40 MG capsule  2. loratadine (CLARITIN) 10 MG tablet if needed  3.   4.  5.  6.  ____ Fleet Enema (as directed)   ____ Use CHG Soap/SAGE wipes as directed  __X__ Use inhalers on the day of surgery  NEBULIZER TREATMENT PRIOR TO ARRIVAL  ____ Stop metformin/Janumet/Farxiga 2 days prior to surgery    ____ Take 1/2 of usual insulin dose the night before surgery. No insulin the morning          of surgery.   ____ Stop Blood Thinners Coumadin/Plavix/Xarelto/Pleta/Pradaxa/Eliquis/Effient/Aspirin  on   Or contact your Surgeon, Cardiologist or Medical Doctor regarding  ability to stop your blood thinners  __X__ Stop Anti-inflammatories 7 days before surgery such as Advil, Ibuprofen, Motrin,  BC or Goodies Powder, Naprosyn, Naproxen, Aleve, Aspirin, Diclofenac, Voltaren   You may take Tylenol if needed  __X__ Stop all herbals and supplements, fish oil or vitamins  until after surgery.    ____ Bring C-Pap to the hospital.

## 2022-05-12 ENCOUNTER — Encounter: Payer: Self-pay | Admitting: General Surgery

## 2022-05-12 ENCOUNTER — Ambulatory Visit
Admission: RE | Admit: 2022-05-12 | Discharge: 2022-05-12 | Disposition: A | Discharge status: Home / Self Care | Payer: BC Managed Care – PPO | Attending: General Surgery | Admitting: General Surgery

## 2022-05-12 ENCOUNTER — Encounter
Admission: RE | Disposition: A | Discharge status: Home / Self Care | Payer: Self-pay | Source: Home / Self Care | Attending: General Surgery

## 2022-05-12 ENCOUNTER — Other Ambulatory Visit: Payer: Self-pay

## 2022-05-12 ENCOUNTER — Ambulatory Visit: Payer: BC Managed Care – PPO | Admitting: Certified Registered"

## 2022-05-12 DIAGNOSIS — K429 Umbilical hernia without obstruction or gangrene: Secondary | ICD-10-CM | POA: Diagnosis not present

## 2022-05-12 DIAGNOSIS — G709 Myoneural disorder, unspecified: Secondary | ICD-10-CM | POA: Diagnosis not present

## 2022-05-12 DIAGNOSIS — K219 Gastro-esophageal reflux disease without esophagitis: Secondary | ICD-10-CM | POA: Insufficient documentation

## 2022-05-12 DIAGNOSIS — Z79899 Other long term (current) drug therapy: Secondary | ICD-10-CM | POA: Diagnosis not present

## 2022-05-12 DIAGNOSIS — J45909 Unspecified asthma, uncomplicated: Secondary | ICD-10-CM | POA: Diagnosis not present

## 2022-05-12 DIAGNOSIS — F17210 Nicotine dependence, cigarettes, uncomplicated: Secondary | ICD-10-CM | POA: Insufficient documentation

## 2022-05-12 DIAGNOSIS — K420 Umbilical hernia with obstruction, without gangrene: Secondary | ICD-10-CM | POA: Insufficient documentation

## 2022-05-12 DIAGNOSIS — G43909 Migraine, unspecified, not intractable, without status migrainosus: Secondary | ICD-10-CM | POA: Diagnosis not present

## 2022-05-12 DIAGNOSIS — F17200 Nicotine dependence, unspecified, uncomplicated: Secondary | ICD-10-CM | POA: Diagnosis not present

## 2022-05-12 HISTORY — PX: INSERTION OF MESH: SHX5868

## 2022-05-12 SURGERY — REPAIR, HERNIA, UMBILICAL, ROBOT-ASSISTED
Anesthesia: General | Site: Abdomen

## 2022-05-12 MED ORDER — DEXAMETHASONE SODIUM PHOSPHATE 10 MG/ML IJ SOLN
INTRAMUSCULAR | Status: DC | PRN
  Administered 2022-05-12: 10 mg via INTRAVENOUS

## 2022-05-12 MED ORDER — CEFAZOLIN SODIUM-DEXTROSE 2-4 GM/100ML-% IV SOLN
INTRAVENOUS | Status: AC
  Filled 2022-05-12: qty 100

## 2022-05-12 MED ORDER — SUGAMMADEX SODIUM 200 MG/2ML IV SOLN
INTRAVENOUS | Status: DC | PRN
  Administered 2022-05-12: 200 mg via INTRAVENOUS

## 2022-05-12 MED ORDER — CHLORHEXIDINE GLUCONATE 0.12 % MT SOLN: 15.0000 mL | Freq: Once | OROMUCOSAL | Status: AC

## 2022-05-12 MED ORDER — BUPIVACAINE-EPINEPHRINE (PF) 0.25% -1:200000 IJ SOLN
INTRAMUSCULAR | Status: DC | PRN
  Administered 2022-05-12: 30 mL

## 2022-05-12 MED ORDER — CHLORHEXIDINE GLUCONATE 0.12 % MT SOLN
OROMUCOSAL | Status: AC
  Administered 2022-05-12: 15 mL via OROMUCOSAL
  Filled 2022-05-12: qty 15

## 2022-05-12 MED ORDER — LACTATED RINGERS IV SOLN: INTRAVENOUS | Status: DC

## 2022-05-12 MED ORDER — ACETAMINOPHEN 10 MG/ML IV SOLN
INTRAVENOUS | Status: DC | PRN
  Administered 2022-05-12: 1000 mg via INTRAVENOUS

## 2022-05-12 MED ORDER — ONDANSETRON HCL 4 MG/2ML IJ SOLN: 4.0000 mg | Freq: Once | INTRAMUSCULAR | Status: DC | PRN

## 2022-05-12 MED ORDER — ROCURONIUM BROMIDE 100 MG/10ML IV SOLN
INTRAVENOUS | Status: DC | PRN
  Administered 2022-05-12: 50 mg via INTRAVENOUS

## 2022-05-12 MED ORDER — FENTANYL CITRATE (PF) 100 MCG/2ML IJ SOLN
INTRAMUSCULAR | Status: AC
  Filled 2022-05-12: qty 2

## 2022-05-12 MED ORDER — DEXMEDETOMIDINE HCL IN NACL 200 MCG/50ML IV SOLN
INTRAVENOUS | Status: DC | PRN
  Administered 2022-05-12: 12 ug via INTRAVENOUS
  Administered 2022-05-12: 8 ug via INTRAVENOUS

## 2022-05-12 MED ORDER — ORAL CARE MOUTH RINSE: 15.0000 mL | Freq: Once | OROMUCOSAL | Status: AC

## 2022-05-12 MED ORDER — MIDAZOLAM HCL 2 MG/2ML IJ SOLN
INTRAMUSCULAR | Status: AC
  Filled 2022-05-12: qty 2

## 2022-05-12 MED ORDER — ONDANSETRON HCL 4 MG/2ML IJ SOLN
INTRAMUSCULAR | Status: DC | PRN
  Administered 2022-05-12: 4 mg via INTRAVENOUS

## 2022-05-12 MED ORDER — KETAMINE HCL 10 MG/ML IJ SOLN
INTRAMUSCULAR | Status: DC | PRN
  Administered 2022-05-12: 30 mg via INTRAVENOUS
  Administered 2022-05-12: 20 mg via INTRAVENOUS

## 2022-05-12 MED ORDER — FENTANYL CITRATE (PF) 100 MCG/2ML IJ SOLN
25.0000 ug | INTRAMUSCULAR | Status: DC | PRN
  Administered 2022-05-12 (×3): 25 ug via INTRAVENOUS

## 2022-05-12 MED ORDER — LIDOCAINE HCL (CARDIAC) PF 100 MG/5ML IV SOSY
PREFILLED_SYRINGE | INTRAVENOUS | Status: DC | PRN
  Administered 2022-05-12: 100 mg via INTRAVENOUS

## 2022-05-12 MED ORDER — BUPIVACAINE-EPINEPHRINE (PF) 0.25% -1:200000 IJ SOLN
INTRAMUSCULAR | Status: AC
  Filled 2022-05-12: qty 30

## 2022-05-12 MED ORDER — 0.9 % SODIUM CHLORIDE (POUR BTL) OPTIME
TOPICAL | Status: DC | PRN
  Administered 2022-05-12: 500 mL

## 2022-05-12 MED ORDER — FENTANYL CITRATE (PF) 100 MCG/2ML IJ SOLN
INTRAMUSCULAR | Status: AC
  Administered 2022-05-12: 25 ug via INTRAVENOUS
  Filled 2022-05-12: qty 2

## 2022-05-12 MED ORDER — PROPOFOL 10 MG/ML IV BOLUS
INTRAVENOUS | Status: AC
  Filled 2022-05-12: qty 20

## 2022-05-12 MED ORDER — ACETAMINOPHEN 10 MG/ML IV SOLN
INTRAVENOUS | Status: AC
  Filled 2022-05-12: qty 100

## 2022-05-12 MED ORDER — PROPOFOL 10 MG/ML IV BOLUS
INTRAVENOUS | Status: DC | PRN
  Administered 2022-05-12: 100 mg via INTRAVENOUS
  Administered 2022-05-12: 10 mg via INTRAVENOUS

## 2022-05-12 MED ORDER — MIDAZOLAM HCL 2 MG/2ML IJ SOLN
INTRAMUSCULAR | Status: DC | PRN
  Administered 2022-05-12: 2 mg via INTRAVENOUS

## 2022-05-12 MED ORDER — CEFAZOLIN SODIUM-DEXTROSE 2-4 GM/100ML-% IV SOLN
2.0000 g | INTRAVENOUS | Status: AC
  Administered 2022-05-12: 2 g via INTRAVENOUS

## 2022-05-12 MED ORDER — KETAMINE HCL 50 MG/5ML IJ SOSY
PREFILLED_SYRINGE | INTRAMUSCULAR | Status: AC
  Filled 2022-05-12: qty 5

## 2022-05-12 MED ORDER — FENTANYL CITRATE (PF) 100 MCG/2ML IJ SOLN
INTRAMUSCULAR | Status: DC | PRN
  Administered 2022-05-12 (×3): 50 ug via INTRAVENOUS

## 2022-05-12 MED ORDER — HYDROCODONE-ACETAMINOPHEN 5-325 MG PO TABS: 1.0000 | ORAL_TABLET | ORAL | 0 refills | Status: AC | PRN

## 2022-05-12 SURGICAL SUPPLY — 52 items
ADH SKN CLS APL DERMABOND .7 (GAUZE/BANDAGES/DRESSINGS) ×1
BAG PRESSURE INF REUSE 1000 (BAG) IMPLANT
BLADE SURG SZ11 CARB STEEL (BLADE) ×1 IMPLANT
COVER TIP SHEARS 8 DVNC (MISCELLANEOUS) ×1 IMPLANT
COVER TIP SHEARS 8MM DA VINCI (MISCELLANEOUS) ×1
COVER WAND RF STERILE (DRAPES) ×1 IMPLANT
DERMABOND ADVANCED .7 DNX12 (GAUZE/BANDAGES/DRESSINGS) ×1 IMPLANT
DRAPE ARM DVNC X/XI (DISPOSABLE) ×3 IMPLANT
DRAPE COLUMN DVNC XI (DISPOSABLE) ×1 IMPLANT
DRAPE DA VINCI XI ARM (DISPOSABLE) ×3
DRAPE DA VINCI XI COLUMN (DISPOSABLE) ×1
ELECT REM PT RETURN 9FT ADLT (ELECTROSURGICAL) ×1
ELECTRODE REM PT RTRN 9FT ADLT (ELECTROSURGICAL) ×1 IMPLANT
GLOVE BIO SURGEON STRL SZ 6.5 (GLOVE) ×2 IMPLANT
GLOVE BIOGEL PI IND STRL 6.5 (GLOVE) ×2 IMPLANT
GOWN STRL REUS W/ TWL LRG LVL3 (GOWN DISPOSABLE) ×3 IMPLANT
GOWN STRL REUS W/TWL LRG LVL3 (GOWN DISPOSABLE) ×3
IRRIGATOR SUCT 8 DISP DVNC XI (IRRIGATION / IRRIGATOR) IMPLANT
IRRIGATOR SUCTION 8MM XI DISP (IRRIGATION / IRRIGATOR)
IV CATH ANGIO 12GX3 LT BLUE (NEEDLE) IMPLANT
IV NS 1000ML (IV SOLUTION)
IV NS 1000ML BAXH (IV SOLUTION) IMPLANT
KIT PINK PAD W/HEAD ARE REST (MISCELLANEOUS) ×1
KIT PINK PAD W/HEAD ARM REST (MISCELLANEOUS) ×1 IMPLANT
LABEL OR SOLS (LABEL) ×1 IMPLANT
MANIFOLD NEPTUNE II (INSTRUMENTS) ×1 IMPLANT
MESH PROGRIP HERNIA FLAT 15X15 (Mesh General) IMPLANT
NDL INSUFFLATION 14GA 120MM (NEEDLE) ×1 IMPLANT
NEEDLE HYPO 22GX1.5 SAFETY (NEEDLE) ×1 IMPLANT
NEEDLE INSUFFLATION 14GA 120MM (NEEDLE) ×1 IMPLANT
NS IRRIG 500ML POUR BTL (IV SOLUTION) ×1 IMPLANT
OBTURATOR OPTICAL STANDARD 8MM (TROCAR) ×1
OBTURATOR OPTICAL STND 8 DVNC (TROCAR) ×1
OBTURATOR OPTICALSTD 8 DVNC (TROCAR) ×1 IMPLANT
PACK LAP CHOLECYSTECTOMY (MISCELLANEOUS) ×1 IMPLANT
SEAL CANN UNIV 5-8 DVNC XI (MISCELLANEOUS) ×3 IMPLANT
SEAL XI 5MM-8MM UNIVERSAL (MISCELLANEOUS) ×3
SET TUBE SMOKE EVAC HIGH FLOW (TUBING) ×1 IMPLANT
SOLUTION ELECTROLUBE (MISCELLANEOUS) ×1 IMPLANT
SUT MNCRL 4-0 (SUTURE) ×1
SUT MNCRL 4-0 27XMFL (SUTURE) ×1
SUT STRATAFIX 0 PDS+ CT-2 23 (SUTURE) ×1
SUT STRATAFIX PDS 30 CT-1 (SUTURE) ×1 IMPLANT
SUT VIC AB 3-0 SH 27 (SUTURE) ×1
SUT VIC AB 3-0 SH 27X BRD (SUTURE) IMPLANT
SUT VICRYL 0 AB UR-6 (SUTURE) ×1 IMPLANT
SUT VLOC 90 2/L VL 12 GS22 (SUTURE) ×2 IMPLANT
SUT VLOC 90 S/L VL9 GS22 (SUTURE) IMPLANT
SUTURE MNCRL 4-0 27XMF (SUTURE) ×1 IMPLANT
SUTURE STRATFX 0 PDS+ CT-2 23 (SUTURE) IMPLANT
TAPE TRANSPORE STRL 2 31045 (GAUZE/BANDAGES/DRESSINGS) ×1 IMPLANT
TRAP FLUID SMOKE EVACUATOR (MISCELLANEOUS) ×1 IMPLANT

## 2022-05-12 NOTE — Transfer of Care (Signed)
Immediate Anesthesia Transfer of Care Note  Patient: Hilma Favors  Procedure(s) Performed: XI ROBOT ASSISTED UMBILICAL HERNIA REPAIR (Abdomen) INSERTION OF MESH (Abdomen)  Patient Location: PACU  Anesthesia Type:General  Level of Consciousness: drowsy  Airway & Oxygen Therapy: Patient Spontanous Breathing and Patient connected to face mask oxygen  Post-op Assessment: Report given to RN and Post -op Vital signs reviewed and stable  Post vital signs: Reviewed and stable  Last Vitals:  Vitals Value Taken Time  BP 159/78 05/12/22 1420  Temp    Pulse 63 05/12/22 1421  Resp 11 05/12/22 1421  SpO2 100 % 05/12/22 1421  Vitals shown include unvalidated device data.  Last Pain:  Vitals:   05/12/22 1058  TempSrc: Oral         Complications: No notable events documented.

## 2022-05-12 NOTE — Anesthesia Preprocedure Evaluation (Signed)
Anesthesia Evaluation  Patient identified by MRN, date of birth, ID band Patient awake    Reviewed: Allergy & Precautions, NPO status , Patient's Chart, lab work & pertinent test results  History of Anesthesia Complications Negative for: history of anesthetic complications  Airway Mallampati: III       Dental  (+) Dental Advidsory Given, Poor Dentition   Pulmonary neg shortness of breath, asthma , neg sleep apnea, neg COPD, neg recent URI, Current Smoker and Patient abstained from smoking.,    Pulmonary exam normal        Cardiovascular (-) hypertension(-) angina(-) CAD, (-) Past MI and (-) Cardiac Stents negative cardio ROS Normal cardiovascular exam(-) dysrhythmias + Valvular Problems/Murmurs      Neuro/Psych  Headaches, neg Seizures  Neuromuscular disease negative psych ROS   GI/Hepatic Neg liver ROS, GERD  ,  Endo/Other  negative endocrine ROS  Renal/GU negative Renal ROS     Musculoskeletal negative musculoskeletal ROS (+)   Abdominal Normal abdominal exam  (+)   Peds  Hematology negative hematology ROS (+)   Anesthesia Other Findings Past Medical History: No date: Asthma No date: Back pain No date: Chronic sinusitis No date: GERD (gastroesophageal reflux disease) No date: Heart murmur No date: Leaky heart valve No date: Migraine No date: Migraine No date: Trichomonas infection  Reproductive/Obstetrics negative OB ROS                             Anesthesia Physical  Anesthesia Plan  ASA: 3  Anesthesia Plan: General   Post-op Pain Management:    Induction: Intravenous  PONV Risk Score and Plan: 2 and Ondansetron, Dexamethasone, Midazolam and Treatment may vary due to age or medical condition  Airway Management Planned: Oral ETT  Additional Equipment:   Intra-op Plan:   Post-operative Plan: Extubation in OR  Informed Consent: I have reviewed the patients History  and Physical, chart, labs and discussed the procedure including the risks, benefits and alternatives for the proposed anesthesia with the patient or authorized representative who has indicated his/her understanding and acceptance.     Dental advisory given  Plan Discussed with: CRNA and Surgeon  Anesthesia Plan Comments:         Anesthesia Quick Evaluation

## 2022-05-12 NOTE — Interval H&P Note (Signed)
History and Physical Interval Note:  05/12/2022 11:40 AM  Teresa Deleon  has presented today for surgery, with the diagnosis of Y56.3 umbilical hernia w/o obstruction or gangrene.  The various methods of treatment have been discussed with the patient and family. After consideration of risks, benefits and other options for treatment, the patient has consented to  Procedure(s): XI [place] (N/A) as a surgical intervention.  The patient's history has been reviewed, patient examined, no change in status, stable for surgery.  I have reviewed the patient's chart and labs.  Questions were answered to the patient's satisfaction.     Herbert Pun

## 2022-05-12 NOTE — Op Note (Signed)
Preoperative diagnosis: Umbilical Hernia   Postoperative diagnosis: Umbilical Hernia   Procedure: Robotic assisted laparoscopic transabdominal pre peritoneal umbilical hernia repair with mesh  Anesthesia: General   Surgeon: Herbert Pun, MD, FACS   Wound Classification: Clean   Specimen: None   Complications: None   Estimated Blood Loss: 5 mL   Indications: A 56 year old female with symptomatic umbilical hernia. Repair indicated to improve pain and avoid complications such as incarceration or strangulation.    Findings: 3 cm incarcerated umbilical hernia 2. Tension free repair achieved with 12 x 12 cm Progrip mesh 3. Adequate hemostasis     Description of procedure: The patient was brought to the operating room and general anesthesia was induced. A time-out was completed verifying correct patient, procedure, site, positioning, and implant(s) and/or special equipment prior to beginning this procedure. Antibiotics were administered prior to making the incision. SCDs placed. The anterior abdominal wall was prepped and draped in the standard sterile fashion.    Palmer's point chosen for entry.  Veress needle placed and abdomen insufflated to 15cm without any dramatic increase in pressure.  Needle removed and optiview technique used to place 8 mm port at same point.  No injury noted during placement. Two additional ports were placed along left lateral aspect.  Xi robot then docked into place.     A pre peritoneal flap was started 6 cm lateral to the umbilical defect. The pre peritoneal flap was extended 6 cm away from the hernia defect in all directions. The Hernia sac and content were reduced.    Insufflation dropped to 8 mm and transfacial suture with 0 stratafix used to primarily close defect under minimal tension. Progrip 12 x 12 cm  mesh was placed within the the pre peritoneal flap and self attached to the anterior abdominal wall centered over the defect.  The pre peritoneal  flap was closed using 2-0 VLock.  Any peritoneal defect was closed with 3-0 vicryl.     Robot was undocked.  Abdomen then desufflated while camera within abdomen to ensure no signs of new bleed prior to removing camera and rest of ports completely.  All skin incisions closed with 4-0 Monocryl in a subcuticular fashion.  All wounds then dressed with Dermabond.   Patient was then successfully awakened and transferred to PACU in stable condition.  At the end of the procedure sponge and instrument counts were correct.

## 2022-05-12 NOTE — Anesthesia Procedure Notes (Signed)
Procedure Name: Intubation Date/Time: 05/12/2022 12:15 PM  Performed by: Biagio Borg, CRNAPre-anesthesia Checklist: Patient identified, Emergency Drugs available, Suction available and Patient being monitored Patient Re-evaluated:Patient Re-evaluated prior to induction Oxygen Delivery Method: Circle system utilized Preoxygenation: Pre-oxygenation with 100% oxygen Induction Type: IV induction Ventilation: Mask ventilation without difficulty Laryngoscope Size: McGraph and 3 Grade View: Grade I Tube type: Oral Tube size: 7.0 mm Number of attempts: 1 Airway Equipment and Method: Stylet Placement Confirmation: ETT inserted through vocal cords under direct vision, positive ETCO2 and breath sounds checked- equal and bilateral Secured at: 21 cm Tube secured with: Tape Dental Injury: Teeth and Oropharynx as per pre-operative assessment

## 2022-05-12 NOTE — Discharge Instructions (Addendum)

## 2022-05-15 ENCOUNTER — Encounter: Payer: Self-pay | Admitting: General Surgery

## 2022-05-15 NOTE — Anesthesia Postprocedure Evaluation (Signed)
Anesthesia Post Note  Patient: Teresa Deleon  Procedure(s) Performed: XI ROBOT ASSISTED UMBILICAL HERNIA REPAIR (Abdomen) INSERTION OF MESH (Abdomen)  Patient location during evaluation: PACU Anesthesia Type: General Level of consciousness: awake and alert Pain management: pain level controlled Vital Signs Assessment: post-procedure vital signs reviewed and stable Respiratory status: spontaneous breathing, nonlabored ventilation, respiratory function stable and patient connected to nasal cannula oxygen Cardiovascular status: blood pressure returned to baseline and stable Postop Assessment: no apparent nausea or vomiting Anesthetic complications: no   No notable events documented.   Last Vitals:  Vitals:   05/12/22 1535 05/12/22 1543  BP:  (!) 140/1  Pulse: (!) 58 64  Resp: 12 16  Temp: (!) 36.2 C (!) 36.2 C  SpO2: 98% 97%    Last Pain:  Vitals:   05/15/22 0828  TempSrc:   PainSc: [place]

## 2022-05-22 DIAGNOSIS — N841 Polyp of cervix uteri: Secondary | ICD-10-CM | POA: Diagnosis not present

## 2022-05-25 DIAGNOSIS — K429 Umbilical hernia without obstruction or gangrene: Secondary | ICD-10-CM | POA: Diagnosis not present

## 2022-06-07 DIAGNOSIS — K429 Umbilical hernia without obstruction or gangrene: Secondary | ICD-10-CM | POA: Diagnosis not present

## 2022-07-27 ENCOUNTER — Ambulatory Visit
Admission: EM | Admit: 2022-07-27 | Discharge: 2022-07-27 | Disposition: A | Discharge status: Home / Self Care | Payer: BC Managed Care – PPO | Attending: Family Medicine | Admitting: Family Medicine

## 2022-07-27 ENCOUNTER — Encounter: Payer: Self-pay | Admitting: Emergency Medicine

## 2022-07-27 DIAGNOSIS — R519 Headache, unspecified: Secondary | ICD-10-CM | POA: Diagnosis not present

## 2022-07-27 DIAGNOSIS — Z8616 Personal history of COVID-19: Secondary | ICD-10-CM | POA: Insufficient documentation

## 2022-07-27 DIAGNOSIS — J4521 Mild intermittent asthma with (acute) exacerbation: Secondary | ICD-10-CM | POA: Insufficient documentation

## 2022-07-27 DIAGNOSIS — J101 Influenza due to other identified influenza virus with other respiratory manifestations: Secondary | ICD-10-CM | POA: Diagnosis not present

## 2022-07-27 DIAGNOSIS — Z1152 Encounter for screening for COVID-19: Secondary | ICD-10-CM | POA: Insufficient documentation

## 2022-07-27 LAB — RESP PANEL BY RT-PCR (RSV, FLU A&B, COVID)  RVPGX2
Influenza A by PCR: POSITIVE — AB
Influenza B by PCR: NEGATIVE
Resp Syncytial Virus by PCR: NEGATIVE
SARS Coronavirus 2 by RT PCR: NEGATIVE

## 2022-07-27 MED ORDER — OSELTAMIVIR PHOSPHATE 75 MG PO CAPS: 75.0000 mg | ORAL_CAPSULE | Freq: Two times a day (BID) | ORAL | 0 refills | Status: DC

## 2022-07-27 MED ORDER — ALBUTEROL SULFATE HFA 108 (90 BASE) MCG/ACT IN AERS: 2.0000 | INHALATION_SPRAY | Freq: Four times a day (QID) | RESPIRATORY_TRACT | 11 refills | Status: DC | PRN

## 2022-07-27 NOTE — ED Provider Notes (Signed)
[facility]    CSN: [id number] Arrival date & time: 07/27/22  1103      History   Chief Complaint Chief Complaint  Patient presents with   Cough   Headache   Generalized Body Aches    HPI Teresa Deleon is a 57 y.o. female.   HPI   Teresa Deleon presents for headache, body aches, chills and feeling feverish yesterday. She didn't take her temperature.  Two weeks ago her grandson had scarlet fever.  Teresa Deleon denies sore throat.  Endorse rhinorrhea without nasal congestion. Cough was a first dry but is not now is productive.  She has history of asthma.  She was having difficulty breathing.  Had diarrhea but has been drinking a lot of apple juice.  No vomiting.       Past Medical History:  Diagnosis Date   Asthma    Back pain    Chronic sinusitis    COVID 06/2021   06/2021   GERD (gastroesophageal reflux disease)    Heart murmur    Leaky heart valve    Migraine    Migraine    Pre-diabetes    Trichomonas infection     Patient Active Problem List   Diagnosis Date Noted   Dysphagia 08/19/2020   Colon cancer screening 08/19/2020   URI with cough and congestion 06/12/2020   Suspected COVID-19 virus infection 06/12/2020   Bronchitis with asthma, acute 06/12/2020   Abnormal MRI, lumbar spine 04/19/2020   Cervicalgia 04/19/2020   Lumbar radiculopathy 04/19/2020   Vitamin D deficiency 01/26/2020   HLD (hyperlipidemia) 01/26/2020   RLS (restless legs syndrome) 01/01/2020   Mild intermittent asthma with acute exacerbation 01/01/2020   Frontal sinusitis 01/01/2020   Cigarette nicotine dependence without complication 24/23/5361   Lumbar herniated disc 06/24/2019   Insomnia 06/24/2019   Female pelvic pain 06/24/2019   Diarrhea 03/05/2019   Nausea and vomiting 03/05/2019   COVID-19 virus detected 02/17/2019   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   Chronic sinusitis 04/04/2018   Allergic rhinitis 04/04/2018   Tobacco abuse 04/04/2018    Migraine without status migrainosus, not intractable 04/04/2018   Cardiac murmur 04/04/2018   Gastroesophageal reflux disease 04/04/2018   Prediabetes 04/04/2018   Fibroids 04/04/2018    Past Surgical History:  Procedure Laterality Date   BREAST SURGERY     left breast abcess   COLONOSCOPY WITH PROPOFOL N/A 09/17/2020   Procedure: COLONOSCOPY WITH PROPOFOL;  Surgeon: Jonathon Bellows, MD;  Location: [facility] ENDOSCOPY;  Service: Gastroenterology;  Laterality: N/A;   ESOPHAGOGASTRODUODENOSCOPY (EGD) WITH PROPOFOL N/A 09/17/2020   Procedure: ESOPHAGOGASTRODUODENOSCOPY (EGD) WITH PROPOFOL;  Surgeon: Jonathon Bellows, MD;  Location: [facility] ENDOSCOPY;  Service: Gastroenterology;  Laterality: N/A;   INSERTION OF MESH N/A 05/12/2022   Procedure: INSERTION OF MESH;  Surgeon: Herbert Pun, MD;  Location: [facility] ORS;  Service: General;  Laterality: N/A;    OB History   No obstetric history on file.      Home Medications    Prior to Admission medications   Medication Sig Start Date End Date Taking? Authorizing Provider  oseltamivir (TAMIFLU) 75 MG capsule Take 1 capsule (75 mg total) by mouth every 12 (twelve) hours. 07/27/22  Yes Yaslene Lindamood, DO  albuterol (2.5 MG/3ML) 0.083% NEBU 3 mL, albuterol (5 MG/ML) 0.5% NEBU 0.5 mL Inhale into the lungs every 4 (four) hours.    [provider]  albuterol (VENTOLIN HFA) 108 (90 Base) MCG/ACT inhaler Inhale 2 puffs into the lungs  every 6 (six) hours as needed. 07/27/22   Jarita Raval, Ronnette Juniper, DO  atorvastatin (LIPITOR) 10 MG tablet Take 1 tablet (10 mg total) by mouth daily. At night 11/09/21   McLean-Scocuzza, Nino Glow, MD  Cholecalciferol 1.25 MG (50000 UT) capsule Take 1 capsule (50,000 Units total) by mouth once a week. d3 11/09/21   McLean-Scocuzza, Nino Glow, MD  diclofenac (VOLTAREN) 75 MG EC tablet Take 1 tablet (75 mg total) by mouth every 12 (twelve) hours as needed for moderate pain. 12/05/21   Katy Apo, NP  fluticasone (FLONASE) 50 MCG/ACT nasal  spray Place 2 sprays into both nostrils daily. Prn max 2 sprays 05/11/21   McLean-Scocuzza, Nino Glow, MD  ipratropium-albuterol (DUONEB) 0.5-2.5 (3) MG/3ML SOLN Take 3 mLs by nebulization every 4 (four) hours as needed. Patient not taking: Reported on 05/08/2022 11/09/21   McLean-Scocuzza, Nino Glow, MD  loratadine (CLARITIN) 10 MG tablet Take 1 tablet (10 mg total) by mouth daily as needed for allergies. 11/09/21   McLean-Scocuzza, Nino Glow, MD  metroNIDAZOLE (FLAGYL) 500 MG tablet Take 1 tablet (500 mg total) by mouth 2 (two) times daily. With food 05/04/22   McLean-Scocuzza, Nino Glow, MD  montelukast (SINGULAIR) 10 MG tablet Take 1 tablet (10 mg total) by mouth at bedtime. 11/09/21   McLean-Scocuzza, Nino Glow, MD  nicotine (NICODERM CQ - DOSED IN MG/24 HOURS) 14 mg/24hr patch Place 1 patch (14 mg total) onto the skin daily. Patient not taking: Reported on 05/08/2022 11/09/21   McLean-Scocuzza, Nino Glow, MD  nicotine (NICODERM CQ - DOSED IN MG/24 HOURS) 21 mg/24hr patch Place 1 patch (21 mg total) onto the skin daily. Then 14 then 7 use each x 1 month Patient not taking: Reported on 05/08/2022 11/09/21   McLean-Scocuzza, Nino Glow, MD  nicotine (NICODERM CQ - DOSED IN MG/24 HR) 7 mg/24hr patch Place 1 patch (7 mg total) onto the skin daily. Patient not taking: Reported on 05/08/2022 11/09/21   McLean-Scocuzza, Nino Glow, MD  omeprazole (PRILOSEC) 40 MG capsule Take 1 capsule (40 mg total) by mouth daily. 30 min before food 11/09/21   McLean-Scocuzza, Nino Glow, MD  ondansetron (ZOFRAN ODT) 4 MG disintegrating tablet Take 1 tablet (4 mg total) by mouth every 8 (eight) hours as needed for nausea or vomiting. 11/09/21   McLean-Scocuzza, Nino Glow, MD  tiZANidine (ZANAFLEX) 4 MG tablet Take 1 tablet (4 mg total) by mouth 2 (two) times daily as needed for muscle spasms. 11/09/21   McLean-Scocuzza, Nino Glow, MD  traZODone (DESYREL) 50 MG tablet Take 0.5-1 tablets (25-50 mg total) by mouth at bedtime as needed for sleep. 11/09/21    McLean-Scocuzza, Nino Glow, MD  pantoprazole (PROTONIX) 40 MG tablet Take 1 tablet (40 mg total) by mouth daily. 30 min before 04/09/20 08/30/20  McLean-Scocuzza, Nino Glow, MD    Family History Family History  Problem Relation Age of Onset   Asthma Mother    COPD Mother    Diabetes Mother    Miscarriages / [place] Mother    Diabetes Father    Heart disease Father    Hypertension Father    Heart failure Father    Heart attack Father    Asthma Daughter    Miscarriages / [place] Daughter    Arthritis Maternal Grandmother    Asthma Maternal Grandmother    Hyperlipidemia Maternal Grandmother    Diabetes Maternal Grandfather    Stroke Paternal Grandmother    Heart disease Brother     Social History Social History  Tobacco Use   Smoking status: Some Days    Packs/day: 0.50    Types: Cigarettes   Smokeless tobacco: Never   Tobacco comments:    1 ppd   Vaping Use   Vaping Use: Never used  Substance Use Topics   Alcohol use: Yes    Comment: occassionally   Drug use: No     Allergies   Shellfish allergy, Strawberry (diagnostic), and Tomato   Review of Systems Review of Systems: negative unless otherwise stated in HPI.      Physical Exam Triage Vital Signs ED Triage Vitals  Enc Vitals Group     BP 07/27/22 1259 127/66     Pulse Rate 07/27/22 1259 92     Resp 07/27/22 1259 16     Temp 07/27/22 1259 98.6 F (37 C)     Temp Source 07/27/22 1259 Oral     SpO2 07/27/22 1259 97 %     Weight --      Height --      Head Circumference --      Peak Flow --      Pain Score 07/27/22 1257 10     Pain Loc --      Pain Edu? --      Excl. in [place]? --    No data found.  Updated Vital Signs BP 127/66 (BP Location: Left Arm)   Pulse 92   Temp 98.6 F (37 C) (Oral)   Resp 16   SpO2 97%   Visual Acuity Right Eye Distance:   Left Eye Distance:   Bilateral Distance:    Right Eye Near:   Left Eye Near:    Bilateral Near:     Physical Exam GEN:     alert,  non-toxic appearing female in no distress    HENT:  mucus membranes moist, oropharyngeal without lesions or exudate, no tonsillar hypertrophy,  mild oropharyngeal erythema, clear nasal discharge, bilateral TM normal EYES:   pupils equal and reactive, no scleral injection or discharge NECK:  normal ROM, no meningismus   RESP:  no increased work of breathing, clear to auscultation bilaterally CVS:   regular rate and rhythm Skin:   warm and dry    UC Treatments / Results  Labs (all labs ordered are listed, but only abnormal results are displayed) Labs Reviewed  RESP PANEL BY RT-PCR (RSV, FLU A&B, COVID)  RVPGX2 - Abnormal; Notable for the following components:      Result Value   Influenza A by PCR POSITIVE (*)    All other components within normal limits    EKG   Radiology No results found.  Procedures Procedures (including critical care time)  Medications Ordered in UC Medications - No data to display  Initial Impression / Assessment and Plan / UC Course  I have reviewed the triage vital signs and the nursing notes.  Pertinent labs & imaging results that were available during my care of the patient were reviewed by me and considered in my medical decision making (see chart for details).       Pt is a 57 y.o. female who presents for 1-2 days of respiratory symptoms. Jaiyanna is afebrile here without recent antipyretics. Satting well on room air. Overall pt is ill but non-toxic appearing, well hydrated, without respiratory distress. Pulmonary exam is unremarkable.  COVID and influenza testing obtained. Pt to quarantine until COVID test results or longer if positive.  I will call patient with test results, if positive. History most  consistent with viral illness. Discussed symptomatic treatment.  Explained lack of efficacy of antibiotics in viral disease.  Typical duration of symptoms discussed. Work note provided.   Patient is influenza A positive. Called and left secure  voicemail. Tamiflu sent to [facility].   Return and ED precautions given and voiced understanding. Discussed MDM, treatment plan and plan for follow-up with patient who agrees with plan.     Final Clinical Impressions(s) / UC Diagnoses   Final diagnoses:  Influenza A     Discharge Instructions      We will contact you if your COVID/influenza test is positive.  Please quarantine while you wait for the results.  If your test is negative you may resume normal activities.  If your test is positive please continue to quarantine for at least 5 days from your symptom onset or until you are without a fever for at least 24 hours after the medications.    If your were prescribed medication, stop by the pharmacy to pick them up.   You can take Tylenol and/or Ibuprofen as needed for fever reduction and pain relief.    For cough: honey 1/2 to 1 teaspoon (you can dilute the honey in water or another fluid).  You can also use guaifenesin and dextromethorphan for cough. You can use a humidifier for chest congestion and cough.  If you don't have a humidifier, you can sit in the bathroom with the hot shower running.      For sore throat: try warm salt water gargles, Mucinex sore throat cough drops or cepacol lozenges, throat spray, warm tea or water with lemon/honey, popsicles or ice, or OTC cold relief medicine for throat discomfort. You can also purchase chloraseptic spray at the pharmacy or dollar store.   For congestion: take a daily anti-histamine like Zyrtec, Claritin, and a oral decongestant, such as pseudoephedrine.  You can also use Flonase 1-2 sprays in each nostril daily. Afrin is also a good option, if you do not have high blood pressure.    It is important to stay hydrated: drink plenty of fluids (water, gatorade/powerade/pedialyte, juices, or teas) to keep your throat moisturized and help further relieve irritation/discomfort.    Return or go to the Emergency Department if symptoms worsen or  do not improve in the next few days      ED Prescriptions     Medication Sig Dispense Auth. Provider   albuterol (VENTOLIN HFA) 108 (90 Base) MCG/ACT inhaler Inhale 2 puffs into the lungs every 6 (six) hours as needed. 18 g Wm Fruchter, DO   oseltamivir (TAMIFLU) 75 MG capsule Take 1 capsule (75 mg total) by mouth every 12 (twelve) hours. 10 capsule Lyndee Hensen, DO      PDMP not reviewed this encounter.   Lyndee Hensen, DO 07/27/22 1357

## 2022-07-27 NOTE — ED Triage Notes (Signed)
Pt presents with a cough, headache and bodyaches since yesterday.

## 2022-07-27 NOTE — Discharge Instructions (Signed)
We will contact you if your COVID/influenza test is positive.  Please quarantine while you wait for the results.  If your test is negative you may resume normal activities.  If your test is positive please continue to quarantine for at least 5 days from your symptom onset or until you are without a fever for at least 24 hours after the medications.    If your were prescribed medication, stop by the pharmacy to pick them up.   You can take Tylenol and/or Ibuprofen as needed for fever reduction and pain relief.    For cough: honey 1/2 to 1 teaspoon (you can dilute the honey in water or another fluid).  You can also use guaifenesin and dextromethorphan for cough. You can use a humidifier for chest congestion and cough.  If you don't have a humidifier, you can sit in the bathroom with the hot shower running.      For sore throat: try warm salt water gargles, Mucinex sore throat cough drops or cepacol lozenges, throat spray, warm tea or water with lemon/honey, popsicles or ice, or OTC cold relief medicine for throat discomfort. You can also purchase chloraseptic spray at the pharmacy or dollar store.   For congestion: take a daily anti-histamine like Zyrtec, Claritin, and a oral decongestant, such as pseudoephedrine.  You can also use Flonase 1-2 sprays in each nostril daily. Afrin is also a good option, if you do not have high blood pressure.    It is important to stay hydrated: drink plenty of fluids (water, gatorade/powerade/pedialyte, juices, or teas) to keep your throat moisturized and help further relieve irritation/discomfort.    Return or go to the Emergency Department if symptoms worsen or do not improve in the next few days  

## 2022-08-22 ENCOUNTER — Encounter: Payer: Self-pay | Admitting: Emergency Medicine

## 2022-08-22 ENCOUNTER — Ambulatory Visit
Admission: EM | Admit: 2022-08-22 | Discharge: 2022-08-22 | Disposition: A | Discharge status: Home / Self Care | Payer: BC Managed Care – PPO | Attending: Physician Assistant | Admitting: Physician Assistant

## 2022-08-22 DIAGNOSIS — U071 COVID-19: Secondary | ICD-10-CM | POA: Diagnosis not present

## 2022-08-22 LAB — RESP PANEL BY RT-PCR (RSV, FLU A&B, COVID)  RVPGX2
Influenza A by PCR: NEGATIVE
Influenza B by PCR: NEGATIVE
Resp Syncytial Virus by PCR: NEGATIVE
SARS Coronavirus 2 by RT PCR: POSITIVE — AB

## 2022-08-22 MED ORDER — PROMETHAZINE-DM 6.25-15 MG/5ML PO SYRP: 5.0000 mL | ORAL_SOLUTION | Freq: Four times a day (QID) | ORAL | 0 refills | Status: DC | PRN

## 2022-08-22 MED ORDER — BENZONATATE 100 MG PO CAPS: 100.0000 mg | ORAL_CAPSULE | Freq: Three times a day (TID) | ORAL | 0 refills | Status: DC

## 2022-08-22 MED ORDER — PAXLOVID (300/100) 20 X 150 MG & 10 X 100MG PO TBPK: 3.0000 | ORAL_TABLET | Freq: Two times a day (BID) | ORAL | 0 refills | Status: AC

## 2022-08-22 NOTE — Discharge Instructions (Addendum)
Tested positive for COVID, COVID is a virus this is steadily improve with time  Begin Paxlovid every morning and every evening for 5 days, this medicine reduces the amount of virus in your body and help to minimize your symptoms however does not fully take away illness, if this medication is too expensive you do not have to take, COVID will improve regardless   May use Tessalon pill every 8 hours to help calm your coughing  You may use cough syrup every 6 hours as needed for additional comfort You can take Tylenol and/or Ibuprofen as needed for fever reduction and pain relief.   For cough: honey 1/2 to 1 teaspoon (you can dilute the honey in water or another fluid).  You can also use guaifenesin and dextromethorphan for cough. You can use a humidifier for chest congestion and cough.  If you don't have a humidifier, you can sit in the bathroom with the hot shower running.      For sore throat: try warm salt water gargles, cepacol lozenges, throat spray, warm tea or water with lemon/honey, popsicles or ice, or OTC cold relief medicine for throat discomfort.   For congestion: take a daily anti-histamine like Zyrtec, Claritin, and a oral decongestant, such as pseudoephedrine.  You can also use Flonase 1-2 sprays in each nostril daily.   It is important to stay hydrated: drink plenty of fluids (water, gatorade/powerade/pedialyte, juices, or teas) to keep your throat moisturized and help further relieve irritation/discomfort.

## 2022-08-22 NOTE — ED Provider Notes (Signed)
[facility]    CSN: [id number] Arrival date & time: 08/22/22  1329      History   Chief Complaint Chief Complaint  Patient presents with   Cough   Generalized Body Aches   Covid Exposure    HPI Teresa Deleon is a 57 y.o. female.   Patient presents for evaluation of chills, nasal congestion, rhinorrhea, cough, shortness of breath, wheeze, nausea and bodyaches beginning 1 day prior.  Endorses throat is scratchy but not painful.  Experiencing bilateral ear pruritus but denies pain or drainage.  Shortness of breath is experienced with exertion and is slightly worsened from baseline.  Cough is productive.  Decreased appetite but tolerating fluids.  Has attempted use of Tylenol.  Known exposure to COVID.  History of asthma, prediabetic, heart murmur    Past Medical History:  Diagnosis Date   Asthma    Back pain    Chronic sinusitis    COVID 06/2021   06/2021   GERD (gastroesophageal reflux disease)    Heart murmur    Leaky heart valve    Migraine    Migraine    Nausea and vomiting 03/05/2019   Pre-diabetes    Trichomonas infection     Patient Active Problem List   Diagnosis Date Noted   Dysphagia 08/19/2020   Colon cancer screening 08/19/2020   URI with cough and congestion 06/12/2020   Suspected COVID-19 virus infection 06/12/2020   Bronchitis with asthma, acute 06/12/2020   Abnormal MRI, lumbar spine 04/19/2020   Cervicalgia 04/19/2020   Lumbar radiculopathy 04/19/2020   Vitamin D deficiency 01/26/2020   HLD (hyperlipidemia) 01/26/2020   RLS (restless legs syndrome) 01/01/2020   Mild intermittent asthma with acute exacerbation 01/01/2020   Frontal sinusitis 01/01/2020   Cigarette nicotine dependence without complication 89/37/3428   Lumbar herniated disc 06/24/2019   Insomnia 06/24/2019   Female pelvic pain 06/24/2019   Diarrhea 03/05/2019   Nausea and vomiting 03/05/2019   COVID-19 virus detected 02/17/2019   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   Chronic sinusitis 04/04/2018   Allergic rhinitis 04/04/2018   Tobacco abuse 04/04/2018   Migraine without status migrainosus, not intractable 04/04/2018   Cardiac murmur 04/04/2018   Gastroesophageal reflux disease 04/04/2018   Prediabetes 04/04/2018   Fibroids 04/04/2018    Past Surgical History:  Procedure Laterality Date   BREAST SURGERY     left breast abcess   COLONOSCOPY WITH PROPOFOL N/A 09/17/2020   Procedure: COLONOSCOPY WITH PROPOFOL;  Surgeon: Jonathon Bellows, MD;  Location: [facility] ENDOSCOPY;  Service: Gastroenterology;  Laterality: N/A;   ESOPHAGOGASTRODUODENOSCOPY (EGD) WITH PROPOFOL N/A 09/17/2020   Procedure: ESOPHAGOGASTRODUODENOSCOPY (EGD) WITH PROPOFOL;  Surgeon: Jonathon Bellows, MD;  Location: [facility] ENDOSCOPY;  Service: Gastroenterology;  Laterality: N/A;   INSERTION OF MESH N/A 05/12/2022   Procedure: INSERTION OF MESH;  Surgeon: Herbert Pun, MD;  Location: [facility] ORS;  Service: General;  Laterality: N/A;    OB History   No obstetric history on file.      Home Medications    Prior to Admission medications   Medication Sig Start Date End Date Taking? Authorizing Provider  albuterol (2.5 MG/3ML) 0.083% NEBU 3 mL, albuterol (5 MG/ML) 0.5% NEBU 0.5 mL Inhale into the lungs every 4 (four) hours.   Yes [provider]  albuterol (VENTOLIN HFA) 108 (90 Base) MCG/ACT inhaler Inhale 2 puffs into the lungs every 6 (six) hours as needed. 07/27/22  Yes Brimage, Vondra, DO  atorvastatin (LIPITOR) 10  MG tablet Take 1 tablet (10 mg total) by mouth daily. At night 11/09/21  Yes McLean-Scocuzza, Nino Glow, MD  Cholecalciferol 1.25 MG (50000 UT) capsule Take 1 capsule (50,000 Units total) by mouth once a week. d3 11/09/21  Yes McLean-Scocuzza, Nino Glow, MD  diclofenac (VOLTAREN) 75 MG EC tablet Take 1 tablet (75 mg total) by mouth every 12 (twelve) hours as needed for moderate pain. 12/05/21  Yes Amyot, Nicholes Stairs, NP  fluticasone (FLONASE) 50  MCG/ACT nasal spray Place 2 sprays into both nostrils daily. Prn max 2 sprays 05/11/21  Yes McLean-Scocuzza, Nino Glow, MD  loratadine (CLARITIN) 10 MG tablet Take 1 tablet (10 mg total) by mouth daily as needed for allergies. 11/09/21  Yes McLean-Scocuzza, Nino Glow, MD  metroNIDAZOLE (FLAGYL) 500 MG tablet Take 1 tablet (500 mg total) by mouth 2 (two) times daily. With food 05/04/22  Yes McLean-Scocuzza, Nino Glow, MD  montelukast (SINGULAIR) 10 MG tablet Take 1 tablet (10 mg total) by mouth at bedtime. 11/09/21  Yes McLean-Scocuzza, Nino Glow, MD  nicotine (NICODERM CQ - DOSED IN MG/24 HOURS) 14 mg/24hr patch Place 1 patch (14 mg total) onto the skin daily. 11/09/21  Yes McLean-Scocuzza, Nino Glow, MD  omeprazole (PRILOSEC) 40 MG capsule Take 1 capsule (40 mg total) by mouth daily. 30 min before food 11/09/21  Yes McLean-Scocuzza, Nino Glow, MD  ondansetron (ZOFRAN ODT) 4 MG disintegrating tablet Take 1 tablet (4 mg total) by mouth every 8 (eight) hours as needed for nausea or vomiting. 11/09/21  Yes McLean-Scocuzza, Nino Glow, MD  oseltamivir (TAMIFLU) 75 MG capsule Take 1 capsule (75 mg total) by mouth every 12 (twelve) hours. 07/27/22  Yes Brimage, Vondra, DO  tiZANidine (ZANAFLEX) 4 MG tablet Take 1 tablet (4 mg total) by mouth 2 (two) times daily as needed for muscle spasms. 11/09/21  Yes McLean-Scocuzza, Nino Glow, MD  traZODone (DESYREL) 50 MG tablet Take 0.5-1 tablets (25-50 mg total) by mouth at bedtime as needed for sleep. 11/09/21  Yes McLean-Scocuzza, Nino Glow, MD  ipratropium-albuterol (DUONEB) 0.5-2.5 (3) MG/3ML SOLN Take 3 mLs by nebulization every 4 (four) hours as needed. Patient not taking: Reported on 05/08/2022 11/09/21   McLean-Scocuzza, Nino Glow, MD  nicotine (NICODERM CQ - DOSED IN MG/24 HOURS) 21 mg/24hr patch Place 1 patch (21 mg total) onto the skin daily. Then 14 then 7 use each x 1 month Patient not taking: Reported on 05/08/2022 11/09/21   McLean-Scocuzza, Nino Glow, MD  nicotine (NICODERM CQ - DOSED  IN MG/24 HR) 7 mg/24hr patch Place 1 patch (7 mg total) onto the skin daily. Patient not taking: Reported on 05/08/2022 11/09/21   McLean-Scocuzza, Nino Glow, MD  pantoprazole (PROTONIX) 40 MG tablet Take 1 tablet (40 mg total) by mouth daily. 30 min before 04/09/20 08/30/20  McLean-Scocuzza, Nino Glow, MD    Family History Family History  Problem Relation Age of Onset   Asthma Mother    COPD Mother    Diabetes Mother    Miscarriages / [place] Mother    Diabetes Father    Heart disease Father    Hypertension Father    Heart failure Father    Heart attack Father    Asthma Daughter    Miscarriages / [place] Daughter    Arthritis Maternal Grandmother    Asthma Maternal Grandmother    Hyperlipidemia Maternal Grandmother    Diabetes Maternal Grandfather    Stroke Paternal Grandmother    Heart disease Brother     Social History  Social History   Tobacco Use   Smoking status: Some Days    Packs/day: 0.50    Types: Cigarettes   Smokeless tobacco: Never   Tobacco comments:    1 ppd   Vaping Use   Vaping Use: Never used  Substance Use Topics   Alcohol use: Yes    Comment: occassionally   Drug use: No     Allergies   Shellfish allergy, Strawberry (diagnostic), and Tomato   Review of Systems Review of Systems  Constitutional:  Positive for chills. Negative for activity change, appetite change, diaphoresis, fatigue, fever and unexpected weight change.  HENT:  Positive for congestion and rhinorrhea. Negative for dental problem, drooling, ear discharge, ear pain, facial swelling, hearing loss, mouth sores, nosebleeds, postnasal drip, sinus pressure, sinus pain, sneezing, sore throat, tinnitus, trouble swallowing and voice change.   Respiratory:  Positive for cough, shortness of breath and wheezing. Negative for apnea, choking, chest tightness and stridor.   Cardiovascular: Negative.   Gastrointestinal:  Positive for nausea. Negative for abdominal distention, abdominal pain,  anal bleeding, blood in stool, constipation, diarrhea, rectal pain and vomiting.  Genitourinary: Negative.   Musculoskeletal:  Positive for myalgias. Negative for arthralgias, back pain, gait problem, joint swelling, neck pain and neck stiffness.     Physical Exam Triage Vital Signs ED Triage Vitals  Enc Vitals Group     BP 08/22/22 1359 139/74     Pulse Rate 08/22/22 1359 86     Resp 08/22/22 1359 16     Temp 08/22/22 1359 99.7 F (37.6 C)     Temp Source 08/22/22 1359 Oral     SpO2 08/22/22 1359 96 %     Weight 08/22/22 1358 142 lb 10.2 oz (64.7 kg)     Height 08/22/22 1358 5' (1.524 m)     Head Circumference --      Peak Flow --      Pain Score 08/22/22 1357 10     Pain Loc --      Pain Edu? --      Excl. in [place]? --    No data found.  Updated Vital Signs BP 139/74 (BP Location: Right Arm)   Pulse 86   Temp 99.7 F (37.6 C) (Oral)   Resp 16   Ht 5' (1.524 m)   Wt 142 lb 10.2 oz (64.7 kg)   SpO2 96%   BMI 27.86 kg/m   Visual Acuity Right Eye Distance:   Left Eye Distance:   Bilateral Distance:    Right Eye Near:   Left Eye Near:    Bilateral Near:     Physical Exam Constitutional:      Appearance: Normal appearance.  HENT:     Head: Normocephalic.     Right Ear: Tympanic membrane, ear canal and external ear normal.     Left Ear: Tympanic membrane, ear canal and external ear normal.     Nose: Congestion and rhinorrhea present.     Mouth/Throat:     Mouth: Mucous membranes are moist.     Pharynx: Posterior oropharyngeal erythema present.  Eyes:     Extraocular Movements: Extraocular movements intact.  Cardiovascular:     Rate and Rhythm: Normal rate and regular rhythm.     Pulses: Normal pulses.     Heart sounds: Normal heart sounds.  Pulmonary:     Effort: Pulmonary effort is normal.     Breath sounds: Normal breath sounds.  Skin:    General: Skin is warm  and dry.  Neurological:     Mental Status: She is alert and oriented to person, place, and  time. Mental status is at baseline.  Psychiatric:        Mood and Affect: Mood normal.        Behavior: Behavior normal.      UC Treatments / Results  Labs (all labs ordered are listed, but only abnormal results are displayed) Labs Reviewed  RESP PANEL BY RT-PCR (RSV, FLU A&B, COVID)  [id number] - Abnormal; Notable for the following components:      Result Value   SARS Coronavirus 2 by RT PCR POSITIVE (*)    All other components within normal limits    EKG   Radiology No results found.  Procedures Procedures (including critical care time)  Medications Ordered in UC Medications - No data to display  Initial Impression / Assessment and Plan / UC Course  I have reviewed the triage vital signs and the nursing notes.  Pertinent labs & imaging results that were available during my care of the patient were reviewed by me and considered in my medical decision making (see chart for details).  COVID-19  Confirmed by PCR, low-grade fever of 99.7 and triage, while ill-appearing patient is in no signs of distress nor toxic, lungs are clear and O2 saturation greater than 90 on room air, low suspicion for pneumonia and therefore imaging deferred, Paxlovid prescribed as well as Tessalon and Promethazine DM for outpatient management, endorses recent refill of all inhalers, may use additional over-the-counter medications for supportive care with urgent care follow-up as needed, work note given Final Clinical Impressions(s) / UC Diagnoses   Final diagnoses:  COVID-19     Discharge Instructions      Tested positive for COVID, COVID is a virus this is steadily improve with time  Begin Paxlovid every morning and every evening for 5 days, this medicine reduces the amount of virus in your body and help to minimize your symptoms however does not fully take away illness  You can take Tylenol and/or Ibuprofen as needed for fever reduction and pain relief.   For cough: honey 1/2 to 1 teaspoon  (you can dilute the honey in water or another fluid).  You can also use guaifenesin and dextromethorphan for cough. You can use a humidifier for chest congestion and cough.  If you don't have a humidifier, you can sit in the bathroom with the hot shower running.      For sore throat: try warm salt water gargles, cepacol lozenges, throat spray, warm tea or water with lemon/honey, popsicles or ice, or OTC cold relief medicine for throat discomfort.   For congestion: take a daily anti-histamine like Zyrtec, Claritin, and a oral decongestant, such as pseudoephedrine.  You can also use Flonase 1-2 sprays in each nostril daily.   It is important to stay hydrated: drink plenty of fluids (water, gatorade/powerade/pedialyte, juices, or teas) to keep your throat moisturized and help further relieve irritation/discomfort.    ED Prescriptions   None    PDMP not reviewed this encounter.   Hans Eden, [place] 08/22/22 [phone]

## 2022-08-22 NOTE — ED Triage Notes (Signed)
Pt c/o chills, body aches, cough, runny nose, hoarseness, scratchy throat. Started yesterday. She state she was exposed to covid over the weekend.

## 2022-08-28 ENCOUNTER — Ambulatory Visit
Admission: EM | Admit: 2022-08-28 | Discharge: 2022-08-28 | Disposition: A | Discharge status: Home / Self Care | Payer: BC Managed Care – PPO | Attending: Emergency Medicine | Admitting: Emergency Medicine

## 2022-08-28 ENCOUNTER — Encounter: Payer: Self-pay | Admitting: Emergency Medicine

## 2022-08-28 DIAGNOSIS — U071 COVID-19: Secondary | ICD-10-CM

## 2022-08-28 DIAGNOSIS — R42 Dizziness and giddiness: Secondary | ICD-10-CM

## 2022-08-28 MED ORDER — IPRATROPIUM BROMIDE 0.06 % NA SOLN: 2.0000 | Freq: Four times a day (QID) | NASAL | 12 refills | Status: DC

## 2022-08-28 NOTE — ED Triage Notes (Signed)
Pt was diagnosed with Covid on 08/22/22. Today she developed dizziness and continues to have cough. Pt did not take the anti-virals prescribed.

## 2022-08-28 NOTE — Discharge Instructions (Addendum)

## 2022-08-28 NOTE — ED Provider Notes (Signed)
[facility]    CSN: [id number] Arrival date & time: 08/28/22  1430      History   Chief Complaint Chief Complaint  Patient presents with   Cough   Dizziness    HPI Teresa Deleon is a 57 y.o. female.   HPI  57 year old female here for evaluation of respiratory symptoms.  The patient was seen in this urgent care on 08/22/2022 and diagnosed with COVID-19.  She was prescribed Tessalon Perles, Paxlovid, and Promethazine DM cough syrup.  She states that she did not go to the pharmacy to pick up her medications because she had an issue with her car.  She reports that she had started to feel better and then had a return of cold chills and headache today.  Also new onset dizziness.  She continues to have a cough that is productive for a milky sputum, diarrhea, and nausea.  She has not had a fever.  Past Medical History:  Diagnosis Date   Asthma    Back pain    Chronic sinusitis    COVID 06/2021   06/2021   GERD (gastroesophageal reflux disease)    Heart murmur    Leaky heart valve    Migraine    Migraine    Nausea and vomiting 03/05/2019   Pre-diabetes    Trichomonas infection     Patient Active Problem List   Diagnosis Date Noted   Dysphagia 08/19/2020   Colon cancer screening 08/19/2020   URI with cough and congestion 06/12/2020   Suspected COVID-19 virus infection 06/12/2020   Bronchitis with asthma, acute 06/12/2020   Abnormal MRI, lumbar spine 04/19/2020   Cervicalgia 04/19/2020   Lumbar radiculopathy 04/19/2020   Vitamin D deficiency 01/26/2020   HLD (hyperlipidemia) 01/26/2020   RLS (restless legs syndrome) 01/01/2020   Mild intermittent asthma with acute exacerbation 01/01/2020   Frontal sinusitis 01/01/2020   Cigarette nicotine dependence without complication 41/63/8453   Lumbar herniated disc 06/24/2019   Insomnia 06/24/2019   Female pelvic pain 06/24/2019   Diarrhea 03/05/2019   Nausea and vomiting 03/05/2019   COVID-19 virus detected  02/17/2019   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   Chronic sinusitis 04/04/2018   Allergic rhinitis 04/04/2018   Tobacco abuse 04/04/2018   Migraine without status migrainosus, not intractable 04/04/2018   Cardiac murmur 04/04/2018   Gastroesophageal reflux disease 04/04/2018   Prediabetes 04/04/2018   Fibroids 04/04/2018    Past Surgical History:  Procedure Laterality Date   BREAST SURGERY     left breast abcess   COLONOSCOPY WITH PROPOFOL N/A 09/17/2020   Procedure: COLONOSCOPY WITH PROPOFOL;  Surgeon: Jonathon Bellows, MD;  Location: [facility] ENDOSCOPY;  Service: Gastroenterology;  Laterality: N/A;   ESOPHAGOGASTRODUODENOSCOPY (EGD) WITH PROPOFOL N/A 09/17/2020   Procedure: ESOPHAGOGASTRODUODENOSCOPY (EGD) WITH PROPOFOL;  Surgeon: Jonathon Bellows, MD;  Location: [facility] ENDOSCOPY;  Service: Gastroenterology;  Laterality: N/A;   INSERTION OF MESH N/A 05/12/2022   Procedure: INSERTION OF MESH;  Surgeon: Herbert Pun, MD;  Location: [facility] ORS;  Service: General;  Laterality: N/A;    OB History   No obstetric history on file.      Home Medications    Prior to Admission medications   Medication Sig Start Date End Date Taking? Authorizing Provider  ipratropium (ATROVENT) 0.06 % nasal spray Place 2 sprays into both nostrils 4 (four) times daily. 08/28/22  Yes Margarette Canada, NP  albuterol (2.5 MG/3ML) 0.083% NEBU 3 mL, albuterol (5 MG/ML) 0.5% NEBU 0.5 mL  Inhale into the lungs every 4 (four) hours.    [provider]  albuterol (VENTOLIN HFA) 108 (90 Base) MCG/ACT inhaler Inhale 2 puffs into the lungs every 6 (six) hours as needed. 07/27/22   Brimage, Ronnette Juniper, DO  atorvastatin (LIPITOR) 10 MG tablet Take 1 tablet (10 mg total) by mouth daily. At night 11/09/21   McLean-Scocuzza, Nino Glow, MD  benzonatate (TESSALON) 100 MG capsule Take 1 capsule (100 mg total) by mouth every 8 (eight) hours. 08/22/22   White, Leitha Schuller, NP  Cholecalciferol 1.25 MG (50000 UT)  capsule Take 1 capsule (50,000 Units total) by mouth once a week. d3 11/09/21   McLean-Scocuzza, Nino Glow, MD  diclofenac (VOLTAREN) 75 MG EC tablet Take 1 tablet (75 mg total) by mouth every 12 (twelve) hours as needed for moderate pain. 12/05/21   Katy Apo, NP  fluticasone (FLONASE) 50 MCG/ACT nasal spray Place 2 sprays into both nostrils daily. Prn max 2 sprays 05/11/21   McLean-Scocuzza, Nino Glow, MD  ipratropium-albuterol (DUONEB) 0.5-2.5 (3) MG/3ML SOLN Take 3 mLs by nebulization every 4 (four) hours as needed. Patient not taking: Reported on 05/08/2022 11/09/21   McLean-Scocuzza, Nino Glow, MD  loratadine (CLARITIN) 10 MG tablet Take 1 tablet (10 mg total) by mouth daily as needed for allergies. 11/09/21   McLean-Scocuzza, Nino Glow, MD  metroNIDAZOLE (FLAGYL) 500 MG tablet Take 1 tablet (500 mg total) by mouth 2 (two) times daily. With food 05/04/22   McLean-Scocuzza, Nino Glow, MD  montelukast (SINGULAIR) 10 MG tablet Take 1 tablet (10 mg total) by mouth at bedtime. 11/09/21   McLean-Scocuzza, Nino Glow, MD  nicotine (NICODERM CQ - DOSED IN MG/24 HOURS) 14 mg/24hr patch Place 1 patch (14 mg total) onto the skin daily. 11/09/21   McLean-Scocuzza, Nino Glow, MD  nicotine (NICODERM CQ - DOSED IN MG/24 HOURS) 21 mg/24hr patch Place 1 patch (21 mg total) onto the skin daily. Then 14 then 7 use each x 1 month Patient not taking: Reported on 05/08/2022 11/09/21   McLean-Scocuzza, Nino Glow, MD  nicotine (NICODERM CQ - DOSED IN MG/24 HR) 7 mg/24hr patch Place 1 patch (7 mg total) onto the skin daily. Patient not taking: Reported on 05/08/2022 11/09/21   McLean-Scocuzza, Nino Glow, MD  omeprazole (PRILOSEC) 40 MG capsule Take 1 capsule (40 mg total) by mouth daily. 30 min before food 11/09/21   McLean-Scocuzza, Nino Glow, MD  ondansetron (ZOFRAN ODT) 4 MG disintegrating tablet Take 1 tablet (4 mg total) by mouth every 8 (eight) hours as needed for nausea or vomiting. 11/09/21   McLean-Scocuzza, Nino Glow, MD  oseltamivir  (TAMIFLU) 75 MG capsule Take 1 capsule (75 mg total) by mouth every 12 (twelve) hours. 07/27/22   Lyndee Hensen, DO  promethazine-dextromethorphan (PROMETHAZINE-DM) 6.25-15 MG/5ML syrup Take 5 mLs by mouth 4 (four) times daily as needed for cough. 08/22/22   White, Leitha Schuller, NP  tiZANidine (ZANAFLEX) 4 MG tablet Take 1 tablet (4 mg total) by mouth 2 (two) times daily as needed for muscle spasms. 11/09/21   McLean-Scocuzza, Nino Glow, MD  traZODone (DESYREL) 50 MG tablet Take 0.5-1 tablets (25-50 mg total) by mouth at bedtime as needed for sleep. 11/09/21   McLean-Scocuzza, Nino Glow, MD  pantoprazole (PROTONIX) 40 MG tablet Take 1 tablet (40 mg total) by mouth daily. 30 min before 04/09/20 08/30/20  McLean-Scocuzza, Nino Glow, MD    Family History Family History  Problem Relation Age of Onset   Asthma Mother  COPD Mother    Diabetes Mother    Miscarriages / [place] Mother    Diabetes Father    Heart disease Father    Hypertension Father    Heart failure Father    Heart attack Father    Asthma Daughter    Miscarriages / [place] Daughter    Arthritis Maternal Grandmother    Asthma Maternal Grandmother    Hyperlipidemia Maternal Grandmother    Diabetes Maternal Grandfather    Stroke Paternal Grandmother    Heart disease Brother     Social History Social History   Tobacco Use   Smoking status: Some Days    Packs/day: 0.50    Types: Cigarettes   Smokeless tobacco: Never   Tobacco comments:    1 ppd   Vaping Use   Vaping Use: Never used  Substance Use Topics   Alcohol use: Yes    Comment: occassionally   Drug use: No     Allergies   Shellfish allergy, Strawberry (diagnostic), and Tomato   Review of Systems Review of Systems  Constitutional:  Positive for chills. Negative for fever.  HENT:  Positive for congestion and rhinorrhea. Negative for sore throat.   Respiratory:  Positive for cough. Negative for shortness of breath and wheezing.   Gastrointestinal:  Positive  for diarrhea and nausea. Negative for vomiting.  Neurological:  Positive for dizziness.     Physical Exam Triage Vital Signs ED Triage Vitals  Enc Vitals Group     BP      Pulse      Resp      Temp      Temp src      SpO2      Weight      Height      Head Circumference      Peak Flow      Pain Score      Pain Loc      Pain Edu?      Excl. in [place]?    No data found.  Updated Vital Signs BP (!) 176/80 (BP Location: Right Arm)   Pulse 67   Temp 98.6 F (37 C) (Oral)   Resp 18   SpO2 97%   Visual Acuity Right Eye Distance:   Left Eye Distance:   Bilateral Distance:    Right Eye Near:   Left Eye Near:    Bilateral Near:     Physical Exam Vitals and nursing note reviewed.  Constitutional:      Appearance: Normal appearance. She is not ill-appearing.  HENT:     Head: Normocephalic and atraumatic.     Right Ear: Ear canal and external ear normal. There is no impacted cerumen.     Left Ear: Ear canal normal. There is no impacted cerumen.     Ears:     Comments: Mild erythema to both tympanic membranes with a small serous effusion    Nose: Congestion and rhinorrhea present.     Comments: Nose is erythematous and edematous with clear discharge in both nares.    Mouth/Throat:     Mouth: Mucous membranes are moist.     Pharynx: Oropharynx is clear. Posterior oropharyngeal erythema present. No oropharyngeal exudate.     Comments: Mild erythema in the posterior pharynx with clear postnasal drip. Cardiovascular:     Rate and Rhythm: Normal rate and regular rhythm.     Pulses: Normal pulses.     Heart sounds: Normal heart sounds. No murmur heard.  No friction rub. No gallop.  Pulmonary:     Effort: Pulmonary effort is normal.     Breath sounds: Normal breath sounds. No wheezing, rhonchi or rales.  Musculoskeletal:     Cervical back: Normal range of motion and neck supple.  Lymphadenopathy:     Cervical: No cervical adenopathy.  Skin:    General: Skin is warm and  dry.     Capillary Refill: Capillary refill takes less than 2 seconds.     Findings: No erythema or rash.  Neurological:     General: No focal deficit present.     Mental Status: She is alert and oriented to person, place, and time.  Psychiatric:        Mood and Affect: Mood normal.        Behavior: Behavior normal.        Thought Content: Thought content normal.        Judgment: Judgment normal.      UC Treatments / Results  Labs (all labs ordered are listed, but only abnormal results are displayed) Labs Reviewed - No data to display  EKG   Radiology No results found.  Procedures Procedures (including critical care time)  Medications Ordered in UC Medications - No data to display  Initial Impression / Assessment and Plan / UC Course  I have reviewed the triage vital signs and the nursing notes.  Pertinent labs & imaging results that were available during my care of the patient were reviewed by me and considered in my medical decision making (see chart for details).   Patient is a nontoxic-appearing 57 year old female presenting for reevaluation of respiratory symptoms in the setting of a recent COVID diagnosis.  She was diagnosed with COVID 6 days ago but did not take any of the prescribed medication or antivirals.  She is continue to have a cough that is productive for a milky sputum, diarrhea, nausea, and had a return of chills, headache, and new onset dizziness today.  On exam patient has a clear effusion behind both tympanic membranes and I suspect that this fluid in the middle ear is what is leading to the dizziness.  She is able to change positions and ambulate without any difficulty.  She still has upper respiratory inflammation as evidenced by the inflamed nasal mucosa with clear rhinorrhea.  Also clear postnasal drip.  Cardiopulmonary exam is benign.  I advised the patient that you can have a rebound of COVID symptoms and that you should quarantine until those symptoms  resolved.  I will give her a work note for another couple of days to see if her symptoms improve.  I have also advised her that she does not need to pick up her antivirals from the pharmacy but she should pick up the cough syrup that she was prescribed.  I have also prescribed Atrovent nasal spray that she can use for the nasal congestion.  Work note provided.   Final Clinical Impressions(s) / UC Diagnoses   Final diagnoses:  Dizziness  COVID-19     Discharge Instructions      Use the Atrovent nasal spray, 2 squirts in each nostril every 6 hours, as needed for runny nose and postnasal drip.  Use the Tessalon Perles every 8 hours during the day.  Take them with a small sip of water.  They may give you some numbness to the base of your tongue or a metallic taste in your mouth, this is normal.  Use the Promethazine DM cough syrup  at bedtime for cough and congestion.  It will make you drowsy so do not take it during the day.  Return for reevaluation or see your primary care provider for any new or worsening symptoms.        ED Prescriptions     Medication Sig Dispense Auth. Provider   ipratropium (ATROVENT) 0.06 % nasal spray Place 2 sprays into both nostrils 4 (four) times daily. 15 mL Margarette Canada, NP      PDMP not reviewed this encounter.   Margarette Canada, NP 08/28/22 1620

## 2022-09-22 ENCOUNTER — Ambulatory Visit
Admission: EM | Admit: 2022-09-22 | Discharge: 2022-09-22 | Disposition: A | Discharge status: Home / Self Care | Payer: BC Managed Care – PPO | Attending: Physician Assistant | Admitting: Physician Assistant

## 2022-09-22 ENCOUNTER — Ambulatory Visit (INDEPENDENT_AMBULATORY_CARE_PROVIDER_SITE_OTHER): Payer: BC Managed Care – PPO

## 2022-09-22 DIAGNOSIS — R0602 Shortness of breath: Secondary | ICD-10-CM | POA: Diagnosis not present

## 2022-09-22 DIAGNOSIS — A084 Viral intestinal infection, unspecified: Secondary | ICD-10-CM | POA: Diagnosis not present

## 2022-09-22 DIAGNOSIS — J45901 Unspecified asthma with (acute) exacerbation: Secondary | ICD-10-CM | POA: Insufficient documentation

## 2022-09-22 DIAGNOSIS — R052 Subacute cough: Secondary | ICD-10-CM | POA: Diagnosis not present

## 2022-09-22 DIAGNOSIS — R197 Diarrhea, unspecified: Secondary | ICD-10-CM | POA: Insufficient documentation

## 2022-09-22 DIAGNOSIS — R112 Nausea with vomiting, unspecified: Secondary | ICD-10-CM | POA: Diagnosis not present

## 2022-09-22 DIAGNOSIS — R059 Cough, unspecified: Secondary | ICD-10-CM | POA: Diagnosis not present

## 2022-09-22 LAB — RAPID INFLUENZA A&B ANTIGENS
Influenza A (ARMC): NEGATIVE
Influenza B (ARMC): NEGATIVE

## 2022-09-22 MED ORDER — PREDNISONE 20 MG PO TABS: 40.0000 mg | ORAL_TABLET | Freq: Every day | ORAL | 0 refills | Status: AC

## 2022-09-22 NOTE — Discharge Instructions (Addendum)
You are negative for the flu and your chest x-ray is clear.  You do not have pneumonia.  Your symptoms are likely related to a stomach virus and asthma exacerbation.  I sent prednisone to the pharmacy for asthma exacerbation.  Continue to use your inhalers and increase your fluids.  ABDOMINAL PAIN: You may take Tylenol for pain relief. Use medications as directed including antiemetics and antidiarrheal medications if suggested or prescribed. You should increase fluids and electrolytes as well as rest over these next several days. If you have any questions or concerns, or if your symptoms are not improving or if especially if they acutely worsen, please call or stop back to the clinic immediately and we will be happy to help you or go to the ER   ABDOMINAL PAIN RED FLAGS: Seek immediate further care if: symptoms remain the same or worsen over the next 3-7 days, you are unable to keep fluids down, you see blood or mucus in your stool, you vomit black or dark red material, you have a fever of 101.F or higher, you have localized and/or persistent abdominal pain

## 2022-09-22 NOTE — ED Triage Notes (Signed)
Pt has taken '1000mg'$  tylenol before coming in today

## 2022-09-22 NOTE — ED Provider Notes (Signed)
[facility]    CSN: [id number] Arrival date & time: 09/22/22  1503      History   Chief Complaint Chief Complaint  Patient presents with   Shortness of Breath   Generalized Body Aches    HPI Teresa Deleon is a 57 y.o. female presenting for nausea/vomiting and diarrhea that began last night.  She reports significant symptoms from 11 PM to 6 AM.  States she last vomited at 3 AM.  Has still had some diarrhea here and there.  Reports some abdominal cramping but no severe pain.  Discomfort is generalized.  Reports cough and congestion have been persistent for the past month since having COVID-19.  She was diagnosed with COVID-19 on 08/22/2022.  Previous to that on 07/27/2022 she was diagnosed with influenza A and had an asthma exacerbation.  Patient reports feeling short of breath at this time but says the inhaler has helped.  She reports that her daughter and granddaughter have also had nausea/vomiting/diarrhea and abdominal cramping.  There are symptoms started around the same time.  This patient denies fever, sore throat.  Has taken Tylenol little while before coming into the urgent care.  No other complaints.  HPI  Past Medical History:  Diagnosis Date   Asthma    Back pain    Chronic sinusitis    COVID 06/2021   06/2021   GERD (gastroesophageal reflux disease)    Heart murmur    Leaky heart valve    Migraine    Migraine    Nausea and vomiting 03/05/2019   Pre-diabetes    Trichomonas infection     Patient Active Problem List   Diagnosis Date Noted   Dysphagia 08/19/2020   Colon cancer screening 08/19/2020   URI with cough and congestion 06/12/2020   Suspected COVID-19 virus infection 06/12/2020   Bronchitis with asthma, acute 06/12/2020   Abnormal MRI, lumbar spine 04/19/2020   Cervicalgia 04/19/2020   Lumbar radiculopathy 04/19/2020   Vitamin D deficiency 01/26/2020   HLD (hyperlipidemia) 01/26/2020   RLS (restless legs syndrome) 01/01/2020   Mild  intermittent asthma with acute exacerbation 01/01/2020   Frontal sinusitis 01/01/2020   Cigarette nicotine dependence without complication [id number]   Lumbar herniated disc 06/24/2019   Insomnia 06/24/2019   Female pelvic pain 06/24/2019   Diarrhea 03/05/2019   Nausea and vomiting 03/05/2019   COVID-19 virus detected 02/17/2019   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   Chronic sinusitis 04/04/2018   Allergic rhinitis 04/04/2018   Tobacco abuse 04/04/2018   Migraine without status migrainosus, not intractable 04/04/2018   Cardiac murmur 04/04/2018   Gastroesophageal reflux disease 04/04/2018   Prediabetes 04/04/2018   Fibroids 04/04/2018    Past Surgical History:  Procedure Laterality Date   BREAST SURGERY     left breast abcess   COLONOSCOPY WITH PROPOFOL N/A 09/17/2020   Procedure: COLONOSCOPY WITH PROPOFOL;  Surgeon: Jonathon Bellows, MD;  Location: [facility] ENDOSCOPY;  Service: Gastroenterology;  Laterality: N/A;   ESOPHAGOGASTRODUODENOSCOPY (EGD) WITH PROPOFOL N/A 09/17/2020   Procedure: ESOPHAGOGASTRODUODENOSCOPY (EGD) WITH PROPOFOL;  Surgeon: Jonathon Bellows, MD;  Location: [facility] ENDOSCOPY;  Service: Gastroenterology;  Laterality: N/A;   INSERTION OF MESH N/A 05/12/2022   Procedure: INSERTION OF MESH;  Surgeon: Herbert Pun, MD;  Location: [facility] ORS;  Service: General;  Laterality: N/A;    OB History   No obstetric history on file.      Home Medications    Prior to Admission medications   Medication  Sig Start Date End Date Taking? Authorizing Provider  albuterol (2.5 MG/3ML) 0.083% NEBU 3 mL, albuterol (5 MG/ML) 0.5% NEBU 0.5 mL Inhale into the lungs every 4 (four) hours.   Yes [provider]  albuterol (VENTOLIN HFA) 108 (90 Base) MCG/ACT inhaler Inhale 2 puffs into the lungs every 6 (six) hours as needed. 07/27/22  Yes Brimage, Vondra, DO  atorvastatin (LIPITOR) 10 MG tablet Take 1 tablet (10 mg total) by mouth daily. At night 11/09/21   Yes McLean-Scocuzza, Nino Glow, MD  benzonatate (TESSALON) 100 MG capsule Take 1 capsule (100 mg total) by mouth every 8 (eight) hours. 08/22/22  Yes White, Adrienne R, NP  Cholecalciferol 1.25 MG (50000 UT) capsule Take 1 capsule (50,000 Units total) by mouth once a week. d3 11/09/21  Yes McLean-Scocuzza, Nino Glow, MD  diclofenac (VOLTAREN) 75 MG EC tablet Take 1 tablet (75 mg total) by mouth every 12 (twelve) hours as needed for moderate pain. 12/05/21  Yes Amyot, Nicholes Stairs, NP  fluticasone (FLONASE) 50 MCG/ACT nasal spray Place 2 sprays into both nostrils daily. Prn max 2 sprays 05/11/21  Yes McLean-Scocuzza, Nino Glow, MD  ipratropium (ATROVENT) 0.06 % nasal spray Place 2 sprays into both nostrils 4 (four) times daily. 08/28/22  Yes Margarette Canada, NP  ipratropium-albuterol (DUONEB) 0.5-2.5 (3) MG/3ML SOLN Take 3 mLs by nebulization every 4 (four) hours as needed. 11/09/21  Yes McLean-Scocuzza, Nino Glow, MD  loratadine (CLARITIN) 10 MG tablet Take 1 tablet (10 mg total) by mouth daily as needed for allergies. 11/09/21  Yes McLean-Scocuzza, Nino Glow, MD  montelukast (SINGULAIR) 10 MG tablet Take 1 tablet (10 mg total) by mouth at bedtime. 11/09/21  Yes McLean-Scocuzza, Nino Glow, MD  omeprazole (PRILOSEC) 40 MG capsule Take 1 capsule (40 mg total) by mouth daily. 30 min before food 11/09/21  Yes McLean-Scocuzza, Nino Glow, MD  ondansetron (ZOFRAN ODT) 4 MG disintegrating tablet Take 1 tablet (4 mg total) by mouth every 8 (eight) hours as needed for nausea or vomiting. 11/09/21  Yes McLean-Scocuzza, Nino Glow, MD  promethazine-dextromethorphan (PROMETHAZINE-DM) 6.25-15 MG/5ML syrup Take 5 mLs by mouth 4 (four) times daily as needed for cough. 08/22/22  Yes White, Adrienne R, NP  tiZANidine (ZANAFLEX) 4 MG tablet Take 1 tablet (4 mg total) by mouth 2 (two) times daily as needed for muscle spasms. 11/09/21  Yes McLean-Scocuzza, Nino Glow, MD  traZODone (DESYREL) 50 MG tablet Take 0.5-1 tablets (25-50 mg total) by mouth at bedtime  as needed for sleep. 11/09/21  Yes McLean-Scocuzza, Nino Glow, MD  metroNIDAZOLE (FLAGYL) 500 MG tablet Take 1 tablet (500 mg total) by mouth 2 (two) times daily. With food 05/04/22   McLean-Scocuzza, Nino Glow, MD  nicotine (NICODERM CQ - DOSED IN MG/24 HOURS) 14 mg/24hr patch Place 1 patch (14 mg total) onto the skin daily. 11/09/21   McLean-Scocuzza, Nino Glow, MD  nicotine (NICODERM CQ - DOSED IN MG/24 HOURS) 21 mg/24hr patch Place 1 patch (21 mg total) onto the skin daily. Then 14 then 7 use each x 1 month Patient not taking: Reported on 05/08/2022 11/09/21   McLean-Scocuzza, Nino Glow, MD  nicotine (NICODERM CQ - DOSED IN MG/24 HR) 7 mg/24hr patch Place 1 patch (7 mg total) onto the skin daily. Patient not taking: Reported on 05/08/2022 11/09/21   McLean-Scocuzza, Nino Glow, MD  oseltamivir (TAMIFLU) 75 MG capsule Take 1 capsule (75 mg total) by mouth every 12 (twelve) hours. 07/27/22   Brimage, Ronnette Juniper, DO  predniSONE (DELTASONE) 20 MG  tablet Take 2 tablets (40 mg total) by mouth daily for 5 days. 09/22/22 09/27/22 Yes Danton Clap, PA-C  pantoprazole (PROTONIX) 40 MG tablet Take 1 tablet (40 mg total) by mouth daily. 30 min before 04/09/20 08/30/20  McLean-Scocuzza, Nino Glow, MD    Family History Family History  Problem Relation Age of Onset   Asthma Mother    COPD Mother    Diabetes Mother    Miscarriages / [place] Mother    Diabetes Father    Heart disease Father    Hypertension Father    Heart failure Father    Heart attack Father    Asthma Daughter    Miscarriages / [place] Daughter    Arthritis Maternal Grandmother    Asthma Maternal Grandmother    Hyperlipidemia Maternal Grandmother    Diabetes Maternal Grandfather    Stroke Paternal Grandmother    Heart disease Brother     Social History Social History   Tobacco Use   Smoking status: Some Days    Packs/day: 0.50    Types: Cigarettes   Smokeless tobacco: Never   Tobacco comments:    1 ppd   Vaping Use   Vaping Use: Never  used  Substance Use Topics   Alcohol use: Yes    Comment: occassionally   Drug use: No     Allergies   Shellfish allergy, Strawberry (diagnostic), and Tomato   Review of Systems Review of Systems  Constitutional:  Positive for appetite change and fatigue. Negative for chills, diaphoresis and fever.  HENT:  Positive for congestion. Negative for ear pain, rhinorrhea, sinus pressure, sinus pain and sore throat.   Respiratory:  Positive for cough and shortness of breath.   Cardiovascular:  Negative for chest pain.  Gastrointestinal:  Positive for abdominal pain, diarrhea, nausea and vomiting.  Genitourinary:  Negative for difficulty urinating and dysuria.  Musculoskeletal:  Negative for arthralgias and myalgias.  Skin:  Negative for rash.  Neurological:  Positive for dizziness. Negative for weakness and headaches.  Hematological:  Negative for adenopathy.     Physical Exam Triage Vital Signs ED Triage Vitals  Enc Vitals Group     BP      Pulse      Resp      Temp      Temp src      SpO2      Weight      Height      Head Circumference      Peak Flow      Pain Score      Pain Loc      Pain Edu?      Excl. in [place]?    No data found.  Updated Vital Signs BP 120/63 (BP Location: Left Arm)   Pulse 88   Temp 98.4 F (36.9 C) (Oral)   Ht 5' (1.524 m)   Wt 145 lb (65.8 kg)   SpO2 99%   BMI 28.32 kg/m     Physical Exam Vitals and nursing note reviewed.  Constitutional:      General: She is not in acute distress.    Appearance: Normal appearance. She is ill-appearing. She is not toxic-appearing.  HENT:     Head: Normocephalic and atraumatic.     Nose: Congestion present.     Mouth/Throat:     Mouth: Mucous membranes are moist.     Pharynx: Oropharynx is clear.  Eyes:     General: No scleral icterus.  Right eye: No discharge.        Left eye: No discharge.     Conjunctiva/sclera: Conjunctivae normal.  Cardiovascular:     Rate and Rhythm: Normal rate and  regular rhythm.     Heart sounds: Normal heart sounds.  Pulmonary:     Effort: Pulmonary effort is normal. No respiratory distress.     Breath sounds: Normal breath sounds.  Abdominal:     Palpations: Abdomen is soft.     Tenderness: There is abdominal tenderness (generalized). There is no right CVA tenderness or left CVA tenderness.  Musculoskeletal:     Cervical back: Neck supple.  Skin:    General: Skin is dry.  Neurological:     General: No focal deficit present.     Mental Status: She is alert. Mental status is at baseline.     Motor: No weakness.     Gait: Gait normal.  Psychiatric:        Mood and Affect: Mood normal.        Behavior: Behavior normal.        Thought Content: Thought content normal.      UC Treatments / Results  Labs (all labs ordered are listed, but only abnormal results are displayed) Labs Reviewed  RAPID INFLUENZA A&B ANTIGENS    EKG   Radiology DG Chest 2 View  Result Date: 09/22/2022 CLINICAL DATA:  Cough, chest congestion, shortness of breath EXAM: CHEST - 2 VIEW COMPARISON:  07/13/2021 FINDINGS: Cardiac size is within normal limits. Low position of diaphragms suggests COPD. There are no signs of pulmonary edema or focal pulmonary consolidation. There is no pleural effusion or pneumothorax. IMPRESSION: There are no new infiltrates or signs of pulmonary edema. Electronically Signed   By: Elmer Picker M.D.   On: 09/22/2022 16:58    Procedures Procedures (including critical care time)  Medications Ordered in UC Medications - No data to display  Initial Impression / Assessment and Plan / UC Course  I have reviewed the triage vital signs and the nursing notes.  Pertinent labs & imaging results that were available during my care of the patient were reviewed by me and considered in my medical decision making (see chart for details).   57 year old female presents for nausea/vomiting/diarrhea and abdominal cramping since last night.   Symptoms were most severe for the first 4 to 6 hours.  Reports continued nausea and diarrhea but vomiting has ceased.  Reports cough and congestion for the past month since having COVID-19.  Reports 2 family members who are currently sick with the same GI symptoms.  Vitals are normal and stable.  She is ill-appearing but nontoxic.  On exam she has nasal congestion.  Throat is clear.  Chest clear to auscultation heart regular rate and rhythm.  Abdomen soft with generalized tenderness to palpation.  No guarding or rebound.  Rapid flu testing obtained to assess for possible influenza B given the increased rates of influenza B in the community at this time.  Flu testing negative.  Chest x-ray obtained to assess for possible pneumonia given back-to-back flu, COVID and continued cough and congestion with shortness of breath.  X-ray without acute abnormality.  Discussed all results with patient.  Suspect viral gastroenteritis and asthma exacerbation with subacute cough.  Treating at this time with prednisone for the asthma exacerbation.  Offered antiemetics but patient declines.  Advised increasing fluid intake and rest as well as trying Imodium or Pepto-Bismol.  Reviewed that she should be feeling better  within the next couple of days but if fever, worsening abdominal pain, increased shortness of breath she is to go to the ER.  Work note given.   Final Clinical Impressions(s) / UC Diagnoses   Final diagnoses:  Viral gastroenteritis  Subacute cough  Nausea vomiting and diarrhea  Asthma with acute exacerbation, unspecified asthma severity, unspecified whether persistent     Discharge Instructions      You are negative for the flu and your chest x-ray is clear.  You do not have pneumonia.  Your symptoms are likely related to a stomach virus and asthma exacerbation.  I sent prednisone to the pharmacy for asthma exacerbation.  Continue to use your inhalers and increase your fluids.  ABDOMINAL PAIN:  You may take Tylenol for pain relief. Use medications as directed including antiemetics and antidiarrheal medications if suggested or prescribed. You should increase fluids and electrolytes as well as rest over these next several days. If you have any questions or concerns, or if your symptoms are not improving or if especially if they acutely worsen, please call or stop back to the clinic immediately and we will be happy to help you or go to the ER   ABDOMINAL PAIN RED FLAGS: Seek immediate further care if: symptoms remain the same or worsen over the next 3-7 days, you are unable to keep fluids down, you see blood or mucus in your stool, you vomit black or dark red material, you have a fever of 101.F or higher, you have localized and/or persistent abdominal pain       ED Prescriptions     Medication Sig Dispense Auth. Provider   predniSONE (DELTASONE) 20 MG tablet Take 2 tablets (40 mg total) by mouth daily for 5 days. 10 tablet Gretta Cool      PDMP not reviewed this encounter.   Danton Clap, PA-C 09/22/22 1728

## 2022-09-22 NOTE — ED Triage Notes (Signed)
Pt states that she went to get food at 11:15 last night and began to have nausea and diarrhea.  Pt was around daughter and niece who were sick with same symptoms.  Pt recently had covid on 08/22/22.   Pt has been throwing up through the night and last vomited at 3am.

## 2022-11-07 ENCOUNTER — Encounter: Payer: Self-pay | Admitting: Family Medicine

## 2022-11-07 ENCOUNTER — Ambulatory Visit (INDEPENDENT_AMBULATORY_CARE_PROVIDER_SITE_OTHER): Payer: BC Managed Care – PPO | Admitting: Family Medicine

## 2022-11-07 VITALS — BP 150/90 | HR 83 | Temp 97.8°F | Ht 60.0 in | Wt 153.6 lb

## 2022-11-07 DIAGNOSIS — Z23 Encounter for immunization: Secondary | ICD-10-CM

## 2022-11-07 DIAGNOSIS — Z1231 Encounter for screening mammogram for malignant neoplasm of breast: Secondary | ICD-10-CM | POA: Diagnosis not present

## 2022-11-07 DIAGNOSIS — M5416 Radiculopathy, lumbar region: Secondary | ICD-10-CM

## 2022-11-07 DIAGNOSIS — R7303 Prediabetes: Secondary | ICD-10-CM

## 2022-11-07 DIAGNOSIS — K219 Gastro-esophageal reflux disease without esophagitis: Secondary | ICD-10-CM

## 2022-11-07 DIAGNOSIS — E782 Mixed hyperlipidemia: Secondary | ICD-10-CM

## 2022-11-07 DIAGNOSIS — Z122 Encounter for screening for malignant neoplasm of respiratory organs: Secondary | ICD-10-CM

## 2022-11-07 DIAGNOSIS — R030 Elevated blood-pressure reading, without diagnosis of hypertension: Secondary | ICD-10-CM

## 2022-11-07 DIAGNOSIS — G4700 Insomnia, unspecified: Secondary | ICD-10-CM

## 2022-11-07 DIAGNOSIS — J4521 Mild intermittent asthma with (acute) exacerbation: Secondary | ICD-10-CM

## 2022-11-07 DIAGNOSIS — E669 Obesity, unspecified: Secondary | ICD-10-CM

## 2022-11-07 DIAGNOSIS — J309 Allergic rhinitis, unspecified: Secondary | ICD-10-CM

## 2022-11-07 DIAGNOSIS — J4520 Mild intermittent asthma, uncomplicated: Secondary | ICD-10-CM

## 2022-11-07 DIAGNOSIS — J4551 Severe persistent asthma with (acute) exacerbation: Secondary | ICD-10-CM

## 2022-11-07 DIAGNOSIS — E559 Vitamin D deficiency, unspecified: Secondary | ICD-10-CM

## 2022-11-07 DIAGNOSIS — G2581 Restless legs syndrome: Secondary | ICD-10-CM

## 2022-11-07 DIAGNOSIS — E785 Hyperlipidemia, unspecified: Secondary | ICD-10-CM

## 2022-11-07 MED ORDER — MONTELUKAST SODIUM 10 MG PO TABS: 10.0000 mg | ORAL_TABLET | Freq: Every day | ORAL | 3 refills | Status: DC

## 2022-11-07 MED ORDER — ATORVASTATIN CALCIUM 10 MG PO TABS: 10.0000 mg | ORAL_TABLET | Freq: Every day | ORAL | 3 refills | Status: DC

## 2022-11-07 MED ORDER — OMEPRAZOLE 40 MG PO CPDR: 40.0000 mg | DELAYED_RELEASE_CAPSULE | Freq: Every day | ORAL | 3 refills | Status: DC

## 2022-11-07 MED ORDER — ALBUTEROL SULFATE HFA 108 (90 BASE) MCG/ACT IN AERS: 2.0000 | INHALATION_SPRAY | Freq: Four times a day (QID) | RESPIRATORY_TRACT | 11 refills | Status: DC | PRN

## 2022-11-07 MED ORDER — FLUTICASONE PROPIONATE 50 MCG/ACT NA SUSP: 2.0000 | Freq: Every day | NASAL | 1 refills | Status: DC

## 2022-11-07 MED ORDER — IPRATROPIUM-ALBUTEROL 0.5-2.5 (3) MG/3ML IN SOLN: 3.0000 mL | RESPIRATORY_TRACT | 0 refills | Status: DC | PRN

## 2022-11-07 MED ORDER — TIZANIDINE HCL 4 MG PO TABS: 4.0000 mg | ORAL_TABLET | Freq: Two times a day (BID) | ORAL | 5 refills | Status: DC | PRN

## 2022-11-07 MED ORDER — IPRATROPIUM BROMIDE 0.06 % NA SOLN: 2.0000 | Freq: Four times a day (QID) | NASAL | 12 refills | Status: DC

## 2022-11-07 MED ORDER — TRAZODONE HCL 50 MG PO TABS: 25.0000 mg | ORAL_TABLET | Freq: Every evening | ORAL | 11 refills | Status: DC | PRN

## 2022-11-07 NOTE — Progress Notes (Signed)
SUBJECTIVE:   Chief Complaint  Patient presents with   transfer of Care   HPI Patient presents to clinic to transfer care.  No acute concerns today.  Elevated blood pressure Asymptomatic.  Reports increased stress at home with caring for elderly mother who recently fell and had surgery.  She reports she does not check blood pressures at home.  Reports no history of treated hypertension.  Denies any headaches, visual changes, shortness of breath, chest pain or lower extremity edema.  Sleep disorder Takes trazodone 25 mg nightly as needed for restless leg syndrome and sleep.  Endorses able to get to sleep but wakes up throughout the night.  Sometimes wakes herself up at night with snoring.  Unknown if sleep apnea.  Does not have excessive daytime fatigue.  Has not had any sleep studies in the past.  Tobacco use Smokes 2 packs every other day.  Has tried nicotine patches and Chantix but is discontinued secondary to vivid dreams.  Has not had low-dose CT lung cancer screening previously.  PERTINENT PMH / PSH: History of migraine. History of sinus migraines Prediabetes Asthma Tobacco use   OBJECTIVE:  BP (!) 150/90   Pulse 83   Temp 97.8 F (36.6 C) (Oral)   Ht 5' (1.524 m)   Wt 153 lb 9.6 oz (69.7 kg)   SpO2 98%   BMI 30.00 kg/m    Physical Exam Vitals reviewed.  Constitutional:      General: She is not in acute distress.    Appearance: She is obese. She is not ill-appearing.  HENT:     Head: Normocephalic.     Nose: Nose normal.  Eyes:     Conjunctiva/sclera: Conjunctivae normal.  Neck:     Thyroid: No thyromegaly or thyroid tenderness.  Cardiovascular:     Rate and Rhythm: Normal rate and regular rhythm.     Heart sounds: Normal heart sounds.  Pulmonary:     Effort: Pulmonary effort is normal.     Breath sounds: Normal breath sounds.  Abdominal:     General: Abdomen is flat. Bowel sounds are normal.     Palpations: Abdomen is soft.  Musculoskeletal:         General: Normal range of motion.     Cervical back: Normal range of motion.  Neurological:     Mental Status: She is alert and oriented to person, place, and time. Mental status is at baseline.  Psychiatric:        Mood and Affect: Mood normal.        Behavior: Behavior normal.        Thought Content: Thought content normal.        Judgment: Judgment normal.     ASSESSMENT/PLAN:  Mild intermittent asthma, unspecified whether complicated Assessment & Plan: Chronic. Has not previously been evaluated by pulmonology or had PFTs completed.  Uses albuterol inhaler and Singulair. Continue Singulair 10 mg at night Refill albuterol 2 puffs every 6 hours as needed Refill DuoNeb every 4 hours as needed If symptoms worsen plan for pulmonology consult  Orders: -     Albuterol Sulfate HFA; Inhale 2 puffs into the lungs every 6 (six) hours as needed.  Dispense: 18 g; Refill: 11 -     Ipratropium-Albuterol; Take 3 mLs by nebulization every 4 (four) hours as needed.  Dispense: 360 mL; Refill: 0  Breast cancer screening by mammogram -     3D Screening Mammogram, Left and Right; Future  Prediabetes -  Hemoglobin A1c; Future  Vitamin D deficiency -     VITAMIN D 25 Hydroxy (Vit-D Deficiency, Fractures); Future  Hyperlipidemia, unspecified hyperlipidemia type -     Lipid panel; Future -     Atorvastatin Calcium; Take 1 tablet (10 mg total) by mouth daily. At night  Dispense: 90 tablet; Refill: 3  Allergic rhinitis, unspecified seasonality, unspecified trigger -     Ipratropium Bromide; Place 2 sprays into both nostrils 4 (four) times daily.  Dispense: 15 mL; Refill: 12 -     Montelukast Sodium; Take 1 tablet (10 mg total) by mouth at bedtime.  Dispense: 90 tablet; Refill: 3 -     Fluticasone Propionate; Place 2 sprays into both nostrils daily. Prn max 2 sprays  Dispense: 16 g; Refill: 1  Insomnia, unspecified type -     traZODone HCl; Take 0.5 tablets (25 mg total) by mouth at bedtime as  needed for sleep.  Dispense: 30 tablet; Refill: 0 -     Ambulatory referral to Pulmonology  Obesity (BMI 30-39.9) -     CBC with Differential/Platelet; Future -     Comprehensive metabolic panel; Future -     TSH; Future -     Vitamin B12; Future -     Ambulatory referral to Pulmonology  Gastroesophageal reflux disease, unspecified whether esophagitis present -     Omeprazole; Take 1 capsule (40 mg total) by mouth daily. 30 min before food  Dispense: 90 capsule; Refill: 3  Need for Tdap vaccination -     Tdap vaccine greater than or equal to 7yo IM  Need for pneumococcal 20-valent conjugate vaccination -     Pneumococcal conjugate vaccine 20-valent  Elevated blood pressure reading Assessment & Plan: BP elevated today.  Repeat remains elevated.  Asymptomatic.  Likely secondary to increased stress at home and rushing to get to clinic appointment. Monitor blood pressure at home, goal less than 130/80 Follow-up in 3 weeks Strict return precautions provided   HCM Declined HIV Declined shingles vaccine Mammogram due.  Referral sent today.  Patient to call to schedule appointment. Last Pap 10/23. NILM, HPV negative.  Due 10/26 Colonoscopy up-to-date. Tdap up-to-date Tobacco use.  Current smoker 2 packs every other day.  Low-dose CT chest for screening.  Will discuss with patient at next visit.  PDMP reviewed  Return in about 3 months (around 02/06/2023) for PCP.  Dana Allan, MD

## 2022-11-07 NOTE — Patient Instructions (Addendum)
It was a pleasure meeting you today. Thank you for allowing me to take part in your health care.  Our goals for today as we discussed include:  Blood pressure elevated today. Monitor blood pressure at home.  Goal less than 130/80 If remains higher than 140/90 consecutively will likely need to start blood pressure medication. Limit salt intake Modify diet and sodium intake Limit caffeine intake  Please schedule appointment for lab appointment.  You will need to fast for 10 hours prior to blood draw   Recommend Shingles vaccine.  This is a 2 dose series and can be given at your local pharmacy.  Please talk to your pharmacist about this.   Received Tetanus Vaccination.  This is given every 10 years.   Received pneumonia 20 vaccine today.  No further vaccinations required.  Referral sent for Mammogram. Please call to schedule appointment. [facility] [address] [place], [place] [address] [phone]   Refills have been sent on all of your medications.   Referral sent for sleep studies   If you have any questions or concerns, please do not hesitate to call the office at [phone].  I look forward to our next visit and until then take care and stay safe.  Regards,   Dana Allan, MD   [facility]

## 2022-11-08 ENCOUNTER — Other Ambulatory Visit: Payer: BC Managed Care – PPO

## 2022-11-09 ENCOUNTER — Other Ambulatory Visit (INDEPENDENT_AMBULATORY_CARE_PROVIDER_SITE_OTHER): Payer: BC Managed Care – PPO

## 2022-11-09 ENCOUNTER — Other Ambulatory Visit: Payer: Self-pay | Admitting: Family Medicine

## 2022-11-09 DIAGNOSIS — E559 Vitamin D deficiency, unspecified: Secondary | ICD-10-CM

## 2022-11-09 DIAGNOSIS — R7303 Prediabetes: Secondary | ICD-10-CM

## 2022-11-09 DIAGNOSIS — E669 Obesity, unspecified: Secondary | ICD-10-CM | POA: Diagnosis not present

## 2022-11-09 DIAGNOSIS — E785 Hyperlipidemia, unspecified: Secondary | ICD-10-CM | POA: Diagnosis not present

## 2022-11-09 LAB — CBC WITH DIFFERENTIAL/PLATELET
Basophils Absolute: 0.1 10*3/uL (ref 0.0–0.1)
Basophils Relative: 0.9 % (ref 0.0–3.0)
Eosinophils Absolute: 0.2 10*3/uL (ref 0.0–0.7)
Eosinophils Relative: 2.9 % (ref 0.0–5.0)
HCT: 40.4 % (ref 36.0–46.0)
Hemoglobin: 13.9 g/dL (ref 12.0–15.0)
Lymphocytes Relative: 28.4 % (ref 12.0–46.0)
Lymphs Abs: 2.2 10*3/uL (ref 0.7–4.0)
MCHC: 34.4 g/dL (ref 30.0–36.0)
MCV: 91.5 fl (ref 78.0–100.0)
Monocytes Absolute: 0.5 10*3/uL (ref 0.1–1.0)
Monocytes Relative: 6.5 % (ref 3.0–12.0)
Neutro Abs: 4.7 10*3/uL (ref 1.4–7.7)
Neutrophils Relative %: 61.3 % (ref 43.0–77.0)
Platelets: 366 10*3/uL (ref 150.0–400.0)
RBC: 4.41 Mil/uL (ref 3.87–5.11)
RDW: 14.7 % (ref 11.5–15.5)
WBC: 7.6 10*3/uL (ref 4.0–10.5)

## 2022-11-09 LAB — COMPREHENSIVE METABOLIC PANEL
ALT: 17 U/L (ref 0–35)
AST: 16 U/L (ref 0–37)
Albumin: 4.5 g/dL (ref 3.5–5.2)
Alkaline Phosphatase: 91 U/L (ref 39–117)
BUN: 9 mg/dL (ref 6–23)
CO2: 24 mEq/L (ref 19–32)
Calcium: 9.2 mg/dL (ref 8.4–10.5)
Chloride: 105 mEq/L (ref 96–112)
Creatinine, Ser: 0.82 mg/dL (ref 0.40–1.20)
GFR: 78.56 mL/min (ref >60.00)
Glucose, Bld: 91 mg/dL (ref 70–99)
Potassium: 3.9 mEq/L (ref 3.5–5.1)
Sodium: 140 mEq/L (ref 135–145)
Total Bilirubin: 0.5 mg/dL (ref 0.2–1.2)
Total Protein: 6.9 g/dL (ref 6.0–8.3)

## 2022-11-09 LAB — LIPID PANEL
Cholesterol: 177 mg/dL (ref 0–200)
HDL: 45.7 mg/dL (ref >39.00)
LDL Cholesterol: 111 mg/dL — ABNORMAL HIGH (ref 0–99)
NonHDL: 130.91
Total CHOL/HDL Ratio: 4
Triglycerides: 98 mg/dL (ref 0.0–149.0)
VLDL: 19.6 mg/dL (ref 0.0–40.0)

## 2022-11-09 LAB — HEMOGLOBIN A1C: Hgb A1c MFr Bld: 6.4 % (ref 4.6–6.5)

## 2022-11-09 LAB — VITAMIN D 25 HYDROXY (VIT D DEFICIENCY, FRACTURES): VITD: 12.2 ng/mL — ABNORMAL LOW (ref 30.00–100.00)

## 2022-11-09 LAB — TSH: TSH: 1.79 u[IU]/mL (ref 0.35–5.50)

## 2022-11-09 LAB — VITAMIN B12: Vitamin B-12: 302 pg/mL (ref 211–911)

## 2022-11-09 MED ORDER — CHOLECALCIFEROL 1.25 MG (50000 UT) PO CAPS: 50000.0000 [IU] | ORAL_CAPSULE | ORAL | 3 refills | Status: DC

## 2022-11-12 ENCOUNTER — Encounter: Payer: Self-pay | Admitting: Family Medicine

## 2022-11-12 DIAGNOSIS — Z1231 Encounter for screening mammogram for malignant neoplasm of breast: Secondary | ICD-10-CM | POA: Insufficient documentation

## 2022-11-12 DIAGNOSIS — R030 Elevated blood-pressure reading, without diagnosis of hypertension: Secondary | ICD-10-CM | POA: Insufficient documentation

## 2022-11-12 DIAGNOSIS — J4520 Mild intermittent asthma, uncomplicated: Secondary | ICD-10-CM | POA: Insufficient documentation

## 2022-11-12 DIAGNOSIS — E669 Obesity, unspecified: Secondary | ICD-10-CM | POA: Insufficient documentation

## 2022-11-12 DIAGNOSIS — Z23 Encounter for immunization: Secondary | ICD-10-CM | POA: Insufficient documentation

## 2022-11-12 MED ORDER — TRAZODONE HCL 50 MG PO TABS: 25.0000 mg | ORAL_TABLET | Freq: Every evening | ORAL | 0 refills | Status: DC | PRN

## 2022-11-12 NOTE — Assessment & Plan Note (Signed)
BP elevated today.  Repeat remains elevated.  Asymptomatic.  Likely secondary to increased stress at home and rushing to get to clinic appointment. Monitor blood pressure at home, goal less than 130/80 Follow-up in 3 weeks Strict return precautions provided

## 2022-11-12 NOTE — Assessment & Plan Note (Signed)
Chronic. Has not previously been evaluated by pulmonology or had PFTs completed.  Uses albuterol inhaler and Singulair. Continue Singulair 10 mg at night Refill albuterol 2 puffs every 6 hours as needed Refill DuoNeb every 4 hours as needed If symptoms worsen plan for pulmonology consult

## 2022-11-14 ENCOUNTER — Other Ambulatory Visit: Payer: Self-pay | Admitting: Family Medicine

## 2022-11-14 DIAGNOSIS — E785 Hyperlipidemia, unspecified: Secondary | ICD-10-CM

## 2022-11-14 MED ORDER — ATORVASTATIN CALCIUM 20 MG PO TABS: 10.0000 mg | ORAL_TABLET | Freq: Every day | ORAL | 3 refills | Status: DC

## 2023-02-06 ENCOUNTER — Ambulatory Visit: Payer: BC Managed Care – PPO | Admitting: Family Medicine

## 2023-04-10 ENCOUNTER — Ambulatory Visit: Payer: Self-pay | Admitting: Family Medicine

## 2023-04-10 NOTE — Progress Notes (Deleted)
   SUBJECTIVE:   No chief complaint on file.  HPI Patient presents to clinic for follow up HTN  No acute concerns today.  Elevated blood pressure Asymptomatic.  Reports increased stress at home with caring for elderly mother who recently fell and had surgery.  She reports she does not check blood pressures at home.  Reports no history of treated hypertension.  Denies any headaches, visual changes, shortness of breath, chest pain or lower extremity edema.  Sleep disorder Takes trazodone 25 mg nightly as needed for restless leg syndrome and sleep.  Endorses able to get to sleep but wakes up throughout the night.  Sometimes wakes herself up at night with snoring.  Unknown if sleep apnea.  Does not have excessive daytime fatigue.  Has not had any sleep studies in the past.  Tobacco use Smokes 2 packs every other day.  Has tried nicotine patches and Chantix but is discontinued secondary to vivid dreams.  Has not had low-dose CT lung cancer screening previously.  PERTINENT PMH / PSH: History of migraine. History of sinus migraines Prediabetes Asthma Tobacco use   OBJECTIVE:  There were no vitals taken for this visit.   Physical Exam Vitals reviewed.  Constitutional:      General: She is not in acute distress.    Appearance: Normal appearance. She is normal weight. She is not ill-appearing, toxic-appearing or diaphoretic.  Eyes:     General:        Right eye: No discharge.        Left eye: No discharge.     Conjunctiva/sclera: Conjunctivae normal.  Cardiovascular:     Rate and Rhythm: Normal rate and regular rhythm.     Heart sounds: Normal heart sounds.  Pulmonary:     Effort: Pulmonary effort is normal.     Breath sounds: Normal breath sounds.  Abdominal:     General: Bowel sounds are normal.  Musculoskeletal:        General: Normal range of motion.  Skin:    General: Skin is warm and dry.  Neurological:     General: No focal deficit present.     Mental Status: She is  alert and oriented to person, place, and time. Mental status is at baseline.  Psychiatric:        Mood and Affect: Mood normal.        Behavior: Behavior normal.        Thought Content: Thought content normal.        Judgment: Judgment normal.     ASSESSMENT/PLAN:  There are no diagnoses linked to this encounter. HCM Declined HIV Declined shingles vaccine Mammogram due.  Referral sent today.  Patient to call to schedule appointment. Last Pap 10/23. NILM, HPV negative.  Due 10/26 Colonoscopy up-to-date. Tdap up-to-date Tobacco use.  Current smoker 2 packs every other day.  Low-dose CT chest for screening.  Will discuss with patient at next visit.  PDMP reviewed  No follow-ups on file.  Dana Allan, MD

## 2023-09-25 ENCOUNTER — Ambulatory Visit
Admission: EM | Admit: 2023-09-25 | Discharge: 2023-09-25 | Disposition: A | Discharge status: Home / Self Care | Attending: Emergency Medicine | Admitting: Emergency Medicine

## 2023-09-25 ENCOUNTER — Encounter: Payer: Self-pay | Admitting: Emergency Medicine

## 2023-09-25 DIAGNOSIS — G8929 Other chronic pain: Secondary | ICD-10-CM | POA: Diagnosis not present

## 2023-09-25 DIAGNOSIS — M5441 Lumbago with sciatica, right side: Secondary | ICD-10-CM

## 2023-09-25 DIAGNOSIS — M5442 Lumbago with sciatica, left side: Secondary | ICD-10-CM

## 2023-09-25 MED ORDER — BACLOFEN 10 MG PO TABS: 10.0000 mg | ORAL_TABLET | Freq: Three times a day (TID) | ORAL | 0 refills | Status: DC

## 2023-09-25 MED ORDER — PREDNISONE 10 MG (21) PO TBPK: ORAL_TABLET | ORAL | 0 refills | Status: DC

## 2023-09-25 MED ORDER — DEXAMETHASONE SODIUM PHOSPHATE 10 MG/ML IJ SOLN
10.0000 mg | Freq: Once | INTRAMUSCULAR | Status: AC
  Administered 2023-09-25: 10 mg via INTRAMUSCULAR

## 2023-09-25 NOTE — ED Provider Notes (Signed)
 [facility]    CSN: [id number] Arrival date & time: 09/25/23  1055      History   Chief Complaint Chief Complaint  Patient presents with   Back Pain    HPI Teresa Deleon is a 58 y.o. female.   HPI  58 year old female with past medical history significant for chronic 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, restless leg syndrome, prediabetes, GERD, migraines, and cardiac murmur presents for evaluation of low back pain.  She reports that her symptoms started 3 to 4 days ago and she is unsure of any provoking factors.  She denies any injuries.  She reports that she works from home and does a lot of sitting.  She had an MRI performed back in 2019 which showed mild degeneration for age of the lumbar spine with minimal disc bulging at L2-3 through L4-5.  She denies urinary or bowel incontinence or saddle anesthesia.  Past Medical History:  Diagnosis Date   Asthma    Back pain    Chronic sinusitis    COVID 06/2021   06/2021   GERD (gastroesophageal reflux disease)    Heart murmur    Leaky heart valve    Migraine    Migraine    Nausea and vomiting 03/05/2019   Pre-diabetes    Trichomonas infection     Patient Active Problem List   Diagnosis Date Noted   Breast cancer screening by mammogram 11/12/2022   Obesity (BMI 30-39.9) 11/12/2022   Need for Tdap vaccination 11/12/2022   Need for pneumococcal 20-valent conjugate vaccination 11/12/2022   Mild intermittent asthma 11/12/2022   Elevated blood pressure reading 11/12/2022   Dysphagia 08/19/2020   Colon cancer screening 08/19/2020   Suspected COVID-19 virus infection 06/12/2020   Bronchitis with asthma, acute 06/12/2020   Abnormal MRI, lumbar spine 04/19/2020   Cervicalgia 04/19/2020   Lumbar radiculopathy 04/19/2020   Vitamin D deficiency 01/26/2020   HLD (hyperlipidemia) 01/26/2020   RLS (restless legs syndrome) 01/01/2020   Frontal sinusitis 01/01/2020   Cigarette nicotine dependence without  complication 01/01/2020   Lumbar herniated disc 06/24/2019   Insomnia 06/24/2019   Female pelvic pain 06/24/2019   Diarrhea 03/05/2019   Nausea and vomiting 03/05/2019   COVID-19 virus detected 02/17/2019   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   Chronic sinusitis 04/04/2018   Allergic rhinitis 04/04/2018   Tobacco abuse 04/04/2018   Migraine without status migrainosus, not intractable 04/04/2018   Cardiac murmur 04/04/2018   Gastroesophageal reflux disease 04/04/2018   Prediabetes 04/04/2018   Fibroids 04/04/2018    Past Surgical History:  Procedure Laterality Date   BREAST SURGERY     left breast abcess   COLONOSCOPY WITH PROPOFOL N/A 09/17/2020   Procedure: COLONOSCOPY WITH PROPOFOL;  Surgeon: Wyline Mood, MD;  Location: [facility] ENDOSCOPY;  Service: Gastroenterology;  Laterality: N/A;   ESOPHAGOGASTRODUODENOSCOPY (EGD) WITH PROPOFOL N/A 09/17/2020   Procedure: ESOPHAGOGASTRODUODENOSCOPY (EGD) WITH PROPOFOL;  Surgeon: Wyline Mood, MD;  Location: [facility] ENDOSCOPY;  Service: Gastroenterology;  Laterality: N/A;   INSERTION OF MESH N/A 05/12/2022   Procedure: INSERTION OF MESH;  Surgeon: Carolan Shiver, MD;  Location: [facility] ORS;  Service: General;  Laterality: N/A;    OB History   No obstetric history on file.      Home Medications    Prior to Admission medications   Medication Sig Start Date End Date Taking? Authorizing Provider  atorvastatin (LIPITOR) 20 MG tablet Take 0.5 tablets (10 mg total) by mouth daily.  At night 11/14/22  Yes Dana Allan, MD  baclofen (LIORESAL) 10 MG tablet Take 1 tablet (10 mg total) by mouth 3 (three) times daily. 09/25/23  Yes Becky Augusta, NP  diclofenac (VOLTAREN) 75 MG EC tablet Take 1 tablet (75 mg total) by mouth every 12 (twelve) hours as needed for moderate pain. 12/05/21  Yes Amyot, Ali Lowe, NP  fluticasone (FLONASE) 50 MCG/ACT nasal spray Place 2 sprays into both nostrils daily. Prn max 2 sprays 11/07/22  Yes  Dana Allan, MD  loratadine (CLARITIN) 10 MG tablet Take 1 tablet (10 mg total) by mouth daily as needed for allergies. 11/09/21  Yes McLean-Scocuzza, Pasty Spillers, MD  montelukast (SINGULAIR) 10 MG tablet Take 1 tablet (10 mg total) by mouth at bedtime. 11/07/22  Yes Dana Allan, MD  omeprazole (PRILOSEC) 40 MG capsule Take 1 capsule (40 mg total) by mouth daily. 30 min before food 11/07/22  Yes Dana Allan, MD  predniSONE (STERAPRED UNI-PAK 21 TAB) 10 MG (21) TBPK tablet Take 6 tablets on day 1, 5 tablets day 2, 4 tablets day 3, 3 tablets day 4, 2 tablets day 5, 1 tablet day 6 09/25/23  Yes Becky Augusta, NP  traZODone (DESYREL) 50 MG tablet Take 0.5 tablets (25 mg total) by mouth at bedtime as needed for sleep. 11/12/22  Yes Dana Allan, MD  albuterol (2.5 MG/3ML) 0.083% NEBU 3 mL, albuterol (5 MG/ML) 0.5% NEBU 0.5 mL Inhale into the lungs every 4 (four) hours.    [provider]  albuterol (VENTOLIN HFA) 108 (90 Base) MCG/ACT inhaler Inhale 2 puffs into the lungs every 6 (six) hours as needed. 11/07/22   Dana Allan, MD  Cholecalciferol 1.25 MG (50000 UT) capsule Take 1 capsule (50,000 Units total) by mouth once a week. d3 11/09/22   Dana Allan, MD  ipratropium (ATROVENT) 0.06 % nasal spray Place 2 sprays into both nostrils 4 (four) times daily. 11/07/22   Dana Allan, MD  ipratropium-albuterol (DUONEB) 0.5-2.5 (3) MG/3ML SOLN Take 3 mLs by nebulization every 4 (four) hours as needed. 11/07/22   Dana Allan, MD  pantoprazole (PROTONIX) 40 MG tablet Take 1 tablet (40 mg total) by mouth daily. 30 min before 04/09/20 08/30/20  McLean-Scocuzza, Pasty Spillers, MD    Family History Family History  Problem Relation Age of Onset   Asthma Mother    COPD Mother    Diabetes Mother    Miscarriages / [place] Mother    Diabetes Father    Heart disease Father    Hypertension Father    Heart failure Father    Heart attack Father    Asthma Daughter    Miscarriages / [place] Daughter    Arthritis  Maternal Grandmother    Asthma Maternal Grandmother    Hyperlipidemia Maternal Grandmother    Diabetes Maternal Grandfather    Stroke Paternal Grandmother    Heart disease Brother     Social History Social History   Tobacco Use   Smoking status: Some Days    Current packs/day: 0.50    Types: Cigarettes   Smokeless tobacco: Never   Tobacco comments:    1 ppd   Vaping Use   Vaping status: Never Used  Substance Use Topics   Alcohol use: Yes    Comment: occassionally   Drug use: No     Allergies   Shellfish allergy, Strawberry (diagnostic), and Tomato   Review of Systems Review of Systems  Genitourinary:        Denies incontinence  Musculoskeletal:  Positive for back pain.  Neurological:  Negative for numbness.     Physical Exam Triage Vital Signs ED Triage Vitals  Encounter Vitals Group     BP      Systolic BP Percentile      Diastolic BP Percentile      Pulse      Resp      Temp      Temp src      SpO2      Weight      Height      Head Circumference      Peak Flow      Pain Score      Pain Loc      Pain Education      Exclude from Growth Chart    No data found.  Updated Vital Signs BP (!) 161/88 (BP Location: Left Arm)   Pulse 82   Temp 98.3 F (36.8 C) (Oral)   Resp 18   SpO2 95%   Visual Acuity Right Eye Distance:   Left Eye Distance:   Bilateral Distance:    Right Eye Near:   Left Eye Near:    Bilateral Near:     Physical Exam Vitals and nursing note reviewed.  Constitutional:      Appearance: Normal appearance.  Musculoskeletal:        General: Tenderness present. No signs of injury.  Skin:    General: Skin is warm.     Capillary Refill: Capillary refill takes less than 2 seconds.  Neurological:     General: No focal deficit present.     Mental Status: She is alert and oriented to person, place, and time.     Motor: No weakness.      UC Treatments / Results  Labs (all labs ordered are listed, but only abnormal  results are displayed) Labs Reviewed - No data to display  EKG   Radiology No results found.  Procedures Procedures (including critical care time)  Medications Ordered in UC Medications  dexamethasone (DECADRON) injection 10 mg (has no administration in time range)    Initial Impression / Assessment and Plan / UC Course  I have reviewed the triage vital signs and the nursing notes.  Pertinent labs & imaging results that were available during my care of the patient were reviewed by me and considered in my medical decision making (see chart for details).   Patient is a nontoxic 58 year old female presenting for evaluation of midline low back pain that started 3 to 4 days ago.  She has no midline spinous process tenderness or step-off in her lumbar spine though she does have tenderness to palpation of the bilateral lower lumbar paraspinous muscle group with muscle tension but no overt spasm.  She does have mild tenderness with along the path of the sciatic nerve diagonal through both buttocks.  Positive straight leg raise bilaterally, worse on the right than the left.  Bilateral lower extremity strength is 5/5.  I suspect that this is an exacerbation of the patient's chronic midline lower back pain with bilateral sciatica.  She states she has been taking Tylenol arthritis without any improvement of pain.  I will treat her with a combination of steroids, muscle doctors, and home physical therapy.  I will have staff administer 10 mg of IM Decadron here in clinic and discharge her home on a prednisone taper start tomorrow morning.  Baclofen 10 mg every 8 hours as needed for muscle tension.  Home physical therapy  exercises prescribed.  If her symptoms do not improve she is to follow-up with her primary care provider for referral to the spine clinic.  Work note provided.   Final Clinical Impressions(s) / UC Diagnoses   Final diagnoses:  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      Discharge Instructions      Take the prednisone according to the package instructions starting tomorrow morning at breakfast time.  You will take it each morning at breakfast time for the next 6 days.  Take the baclofen, 10 mg every 8 hours, on a schedule for the next 48 hours and then as needed.  Apply moist heat to your back for 30 minutes at a time 2-3 times a day to improve blood flow to the area and help remove the lactic acid causing the spasm.  Follow the back exercises given at discharge.  Return for reevaluation for any new or worsening symptoms.      ED Prescriptions     Medication Sig Dispense Auth. Provider   predniSONE (STERAPRED UNI-PAK 21 TAB) 10 MG (21) TBPK tablet Take 6 tablets on day 1, 5 tablets day 2, 4 tablets day 3, 3 tablets day 4, 2 tablets day 5, 1 tablet day 6 21 tablet Becky Augusta, NP   baclofen (LIORESAL) 10 MG tablet Take 1 tablet (10 mg total) by mouth 3 (three) times daily. 30 each Becky Augusta, NP      PDMP not reviewed this encounter.   Becky Augusta, NP 09/25/23 1218

## 2023-09-25 NOTE — Discharge Instructions (Addendum)
 Take the prednisone according to the package instructions starting tomorrow morning at breakfast time.  You will take it each morning at breakfast time for the next 6 days.  Take the baclofen, 10 mg every 8 hours, on a schedule for the next 48 hours and then as needed.  Apply moist heat to your back for 30 minutes at a time 2-3 times a day to improve blood flow to the area and help remove the lactic acid causing the spasm.  Follow the back exercises given at discharge.  Return for reevaluation for any new or worsening symptoms.

## 2023-09-25 NOTE — ED Triage Notes (Signed)
 Patient has hx of herniated disk in back. She thinks she may have twisted her back. Sx x 4 days. Unsure of how she done it.

## 2023-09-26 ENCOUNTER — Ambulatory Visit: Payer: Self-pay | Admitting: Family

## 2023-11-07 ENCOUNTER — Encounter: Payer: Self-pay | Admitting: Emergency Medicine

## 2023-11-07 ENCOUNTER — Ambulatory Visit: Admitting: Family Medicine

## 2023-11-07 ENCOUNTER — Encounter: Payer: Self-pay | Admitting: Family Medicine

## 2023-11-07 VITALS — BP 178/76 | HR 65 | Temp 98.3°F | Resp 20 | Ht 60.0 in | Wt 158.4 lb

## 2023-11-07 DIAGNOSIS — J449 Chronic obstructive pulmonary disease, unspecified: Secondary | ICD-10-CM | POA: Diagnosis not present

## 2023-11-07 DIAGNOSIS — Z13220 Encounter for screening for lipoid disorders: Secondary | ICD-10-CM

## 2023-11-07 DIAGNOSIS — E118 Type 2 diabetes mellitus with unspecified complications: Secondary | ICD-10-CM | POA: Diagnosis not present

## 2023-11-07 DIAGNOSIS — E559 Vitamin D deficiency, unspecified: Secondary | ICD-10-CM

## 2023-11-07 DIAGNOSIS — I10 Essential (primary) hypertension: Secondary | ICD-10-CM

## 2023-11-07 DIAGNOSIS — G4700 Insomnia, unspecified: Secondary | ICD-10-CM

## 2023-11-07 DIAGNOSIS — Z1231 Encounter for screening mammogram for malignant neoplasm of breast: Secondary | ICD-10-CM

## 2023-11-07 DIAGNOSIS — K219 Gastro-esophageal reflux disease without esophagitis: Secondary | ICD-10-CM | POA: Diagnosis not present

## 2023-11-07 DIAGNOSIS — J309 Allergic rhinitis, unspecified: Secondary | ICD-10-CM

## 2023-11-07 DIAGNOSIS — R7309 Other abnormal glucose: Secondary | ICD-10-CM

## 2023-11-07 DIAGNOSIS — Z720 Tobacco use: Secondary | ICD-10-CM

## 2023-11-07 DIAGNOSIS — E538 Deficiency of other specified B group vitamins: Secondary | ICD-10-CM | POA: Diagnosis not present

## 2023-11-07 DIAGNOSIS — Z122 Encounter for screening for malignant neoplasm of respiratory organs: Secondary | ICD-10-CM

## 2023-11-07 DIAGNOSIS — Z114 Encounter for screening for human immunodeficiency virus [HIV]: Secondary | ICD-10-CM

## 2023-11-07 MED ORDER — MONTELUKAST SODIUM 10 MG PO TABS
10.0000 mg | ORAL_TABLET | Freq: Every day | ORAL | 3 refills | Status: DC
  Filled 2024-04-28: qty 30, 30d supply, fill #0

## 2023-11-07 MED ORDER — TRAZODONE HCL 50 MG PO TABS
25.0000 mg | ORAL_TABLET | Freq: Every evening | ORAL | 0 refills | Status: AC | PRN
  Filled 2024-04-28: qty 15, 30d supply, fill #0

## 2023-11-07 MED ORDER — BREZTRI AEROSPHERE 160-9-4.8 MCG/ACT IN AERO: 2.0000 | INHALATION_SPRAY | Freq: Two times a day (BID) | RESPIRATORY_TRACT | Status: DC

## 2023-11-07 MED ORDER — BREZTRI AEROSPHERE 160-9-4.8 MCG/ACT IN AERO
2.0000 | INHALATION_SPRAY | Freq: Two times a day (BID) | RESPIRATORY_TRACT | 11 refills | Status: AC
  Filled 2024-01-04: qty 10.7, 30d supply, fill #0
  Filled 2024-04-28: qty 10.7, 30d supply, fill #1
  Filled 2024-05-14: qty 10.7, 30d supply, fill #2

## 2023-11-07 MED ORDER — OMEPRAZOLE 40 MG PO CPDR
40.0000 mg | DELAYED_RELEASE_CAPSULE | Freq: Every day | ORAL | 3 refills | Status: AC
  Filled 2024-04-28: qty 30, 30d supply, fill #0

## 2023-11-07 MED ORDER — AMLODIPINE BESYLATE 2.5 MG PO TABS: 2.5000 mg | ORAL_TABLET | Freq: Every day | ORAL | 0 refills | Status: DC

## 2023-11-07 NOTE — Progress Notes (Signed)
 Medication Samples have been provided to the patient.  Drug name: Teresa Deleon        Strength: 160mg /25mcg/4.8 mcg per inhalation        Qty: 1   LOT: [id number] Exp.Date: [id number]  Dosing instructions:  Inhale 2 puffs into the lungs in the morning and at bedtime.   The patient has been instructed regarding the correct time, dose, and frequency of taking this medication, including desired effects and most common side effects.   [name]  [name] 3:36 PM 11/07/2023

## 2023-11-07 NOTE — Progress Notes (Signed)
 SUBJECTIVE:   Chief Complaint  Patient presents with   Cough    X 1 month    HPI Presents for acute visit  Discussed the use of AI scribe software for clinical note transcription with the patient, who gave verbal consent to proceed.  History of Present Illness Teresa Deleon is a 58 year old female with COPD who presents with worsening cough and shortness of breath.  Over the past six weeks, she has experienced a worsening cough and increased shortness of breath, which began after cleaning her car. The symptoms have progressively worsened, with increased sputum production, especially when cooking. She denies fever but reports more shortness of breath than usual.  She has a history of COPD and has had chest x-rays in the past. She uses an albuterol  inhaler more frequently due to her cough and works from home, which involves being on the phone often. She also uses Atrovent  and has a nebulizer, which she has used a couple of times in the past few weeks. She smokes, but has reduced her smoking to one pack every three days and uses nicotine  patches during the day.  She had a hernia repair surgery over a year ago and reports persistent nagging pain in the area, which she experiences daily. The pain is described as tender and sometimes more intense. No fever, urinary issues, nausea, vomiting, or weight loss are present. She has regular bowel movements.  She has a history of back pain and was diagnosed with a herniated disc after an MRI was ordered by a previous doctor. She manages her back pain with walking and stretching, avoiding heavy lifting, and being cautious with her movements. She experiences numbness in her feet and fingers occasionally, which was suggested to be related to her back issues.  She uses Singulair  and a nasal spray regularly. She has not seen a heart or lung specialist in the past and has not had recent lung screenings.    PERTINENT PMH / PSH: As above  OBJECTIVE:   BP (!) 178/76   Pulse 65   Temp 98.3 F (36.8 C)   Resp 20   Ht 5' (1.524 m)   Wt 158 lb 6 oz (71.8 kg)   SpO2 98%   BMI 30.93 kg/m    Physical Exam Vitals reviewed.  Constitutional:      General: She is not in acute distress.    Appearance: Normal appearance. She is normal weight. She is not ill-appearing, toxic-appearing or diaphoretic.  Eyes:     General:        Right eye: No discharge.        Left eye: No discharge.     Conjunctiva/sclera: Conjunctivae normal.  Cardiovascular:     Rate and Rhythm: Normal rate and regular rhythm.     Heart sounds: Normal heart sounds.  Pulmonary:     Effort: Pulmonary effort is normal.     Breath sounds: Wheezing present. No rhonchi.  Abdominal:     General: Bowel sounds are normal.  Musculoskeletal:        General: Normal range of motion.  Skin:    General: Skin is warm and dry.  Neurological:     General: No focal deficit present.     Mental Status: She is alert and oriented to person, place, and time. Mental status is at baseline.  Psychiatric:        Mood and Affect: Mood normal.        Behavior: Behavior normal.  Thought Content: Thought content normal.        Judgment: Judgment normal.           11/07/2023    2:50 PM 11/07/2022    8:53 AM 05/03/2022    9:06 AM 04/20/2022    9:04 AM 11/09/2021    8:47 AM  Depression screen PHQ 2/9  Decreased Interest 0 0 0 0 0  Down, Depressed, Hopeless 0 0 0 0 0  PHQ - 2 Score 0 0 0 0 0  Altered sleeping 2      Tired, decreased energy 3      Change in appetite 0      Feeling bad or failure about yourself  0      Trouble concentrating 0      Moving slowly or fidgety/restless 0      Suicidal thoughts 0      PHQ-9 Score 5      Difficult doing work/chores Somewhat difficult          11/07/2023    2:50 PM 01/01/2020   10:09 AM  GAD 7 : Generalized Anxiety Score  Nervous, Anxious, on Edge 0   Control/stop worrying 0 1  Worry too much - different things 2 0  Trouble  relaxing 2 0  Restless 0 0  Easily annoyed or irritable 0 0  Afraid - awful might happen 0 0  Total GAD 7 Score 4   Anxiety Difficulty Somewhat difficult Not difficult at all    ASSESSMENT/PLAN:  Chronic obstructive pulmonary disease, unspecified COPD type (HCC) Assessment & Plan: COPD exacerbation likely due to increased coughing and shortness of breath. Educated on smoking cessation to improve respiratory function. - Order Breztri  inhaler and provide sample if available. - Provide copay card for Breztri  inhaler. - Order chest x-ray. - Refill albuterol  inhaler. - Advise increased use of albuterol  inhaler during exacerbation. - Continue Singulair . - Discuss smoking cessation options including 1-800-QUIT-NOW, patches, gum, and potential use of Wellbutrin.  Orders: -     Breztri  Aerosphere; Inhale 2 puffs into the lungs in the morning and at bedtime. -     Breztri  Aerosphere; Inhale 2 puffs into the lungs in the morning and at bedtime.  Dispense: 10.7 g; Refill: 11 -     CBC with Differential/Platelet -     Comprehensive metabolic panel with GFR  Insomnia, unspecified type Assessment & Plan: Refill Trazadone  Orders: -     traZODone  HCl; Take 0.5 tablets (25 mg total) by mouth at bedtime as needed for sleep.  Dispense: 30 tablet; Refill: 0  Gastroesophageal reflux disease, unspecified whether esophagitis present Assessment & Plan: Refill Omeprazole   Orders: -     Omeprazole ; Take 1 capsule (40 mg total) by mouth daily. 30 min before food  Dispense: 90 capsule; Refill: 3  Allergic rhinitis, unspecified seasonality, unspecified trigger -     Montelukast  Sodium; Take 1 tablet (10 mg total) by mouth at bedtime.  Dispense: 90 tablet; Refill: 3  Screening for lung cancer -     Ambulatory Referral for Lung Cancer Scre  Breast cancer screening by mammogram -     3D Screening Mammogram, Left and Right  Abnormal glucose -     Hemoglobin A1c  Lipid screening -     Lipid  panel  Vitamin D  deficiency -     VITAMIN D  25 Hydroxy (Vit-D Deficiency, Fractures) -     Vitamin D  (Ergocalciferol ); Take 1 capsule (50,000 Units total) by mouth every 7 (  seven) days.  Dispense: 12 capsule; Refill: 3  Vitamin B 12 deficiency -     Vitamin B12  Encounter for screening for HIV -     HIV Antibody (routine testing w rflx)  Primary hypertension Assessment & Plan: Elevated today Start  Amlodipine  2.5 mg at night.  Likely will need to increase at next visit Check Cmet  Orders: -     amLODIPine  Besylate; Take 1 tablet (2.5 mg total) by mouth daily.  Dispense: 30 tablet; Refill: 0  Tobacco abuse Assessment & Plan: Continues to smoke, reduced to one pack every three days. Discussed impact on respiratory health and importance of cessation. Expressed readiness to quit. - Discuss smoking cessation options including 1-800-QUIT-NOW, patches, gum, and potential use of Wellbutrin.   Type 2 diabetes mellitus with complications (HCC) Assessment & Plan: A1c .  New diagnosis Recommend starting Metformin Will need to review diagnosis at next visit with patient and recommendations. Currently on statin Recommend ARB for BP Check [facility] FOOT and Exam annually Consider Diabetic nurse for education      PDMP reviewed  Return in about 4 weeks (around 12/05/2023) for PCP, blood pressure.  Valli Gaw, MD

## 2023-11-07 NOTE — Patient Instructions (Addendum)
 It was a pleasure meeting you today. Thank you for allowing me to take part in your health care.  Our goals for today as we discussed include:  Start Breztri 2 puffs two times a day Continue Albuterol 1-2 puffs every 4-6 hours for shortness of breath or wheezing   Referral sent to pulmonology for lung cancer screening Recommend continue to decrease smoking. 1-800-QUITNOW has resources to help.  Start Amlodipine 2.5 mg at night for high blood pressure Follow up in 4 weeks  Referral sent for Mammogram. Please call to schedule appointment. [facility] [address] [place], [place] [address] [phone]     This is a list of the screening recommended for you and due dates:  Health Maintenance  Topic Date Due   HIV Screening  Never done   Zoster (Shingles) Vaccine (1 of 2) Never done   Mammogram  01/13/2016   COVID-19 Vaccine (3 - 2024-25 season) 03/25/2023   Flu Shot  02/22/2024   Pap with HPV screening  05/04/2027   Colon Cancer Screening  09/17/2030   DTaP/Tdap/Td vaccine (2 - Td or Tdap) 11/06/2032   Pneumococcal Vaccination  Completed   Hepatitis C Screening  Completed   HPV Vaccine  Aged Out   Meningitis B Vaccine  Aged Out     If you have any questions or concerns, please do not hesitate to call the office at [phone].  I look forward to our next visit and until then take care and stay safe.  Regards,   Valli Gaw, MD   [facility]

## 2023-11-08 LAB — CBC WITH DIFFERENTIAL/PLATELET
Basophils Absolute: 0 10*3/uL (ref 0.0–0.1)
Basophils Relative: 0.3 % (ref 0.0–3.0)
Eosinophils Absolute: 0.2 10*3/uL (ref 0.0–0.7)
Eosinophils Relative: 2.6 % (ref 0.0–5.0)
HCT: 41.6 % (ref 36.0–46.0)
Hemoglobin: 13.9 g/dL (ref 12.0–15.0)
Lymphocytes Relative: 39.2 % (ref 12.0–46.0)
Lymphs Abs: 2.8 10*3/uL (ref 0.7–4.0)
MCHC: 33.4 g/dL (ref 30.0–36.0)
MCV: 92.1 fl (ref 78.0–100.0)
Monocytes Absolute: 0.5 10*3/uL (ref 0.1–1.0)
Monocytes Relative: 7.2 % (ref 3.0–12.0)
Neutro Abs: 3.7 10*3/uL (ref 1.4–7.7)
Neutrophils Relative %: 50.7 % (ref 43.0–77.0)
Platelets: 347 10*3/uL (ref 150.0–400.0)
RBC: 4.52 Mil/uL (ref 3.87–5.11)
RDW: 13.7 % (ref 11.5–15.5)
WBC: 7.3 10*3/uL (ref 4.0–10.5)

## 2023-11-08 LAB — LIPID PANEL
Cholesterol: 199 mg/dL (ref 0–200)
HDL: 35.5 mg/dL — ABNORMAL LOW (ref >39.00)
LDL Cholesterol: 126 mg/dL — ABNORMAL HIGH (ref 0–99)
NonHDL: 163.84
Total CHOL/HDL Ratio: 6
Triglycerides: 187 mg/dL — ABNORMAL HIGH (ref 0.0–149.0)
VLDL: 37.4 mg/dL (ref 0.0–40.0)

## 2023-11-08 LAB — COMPREHENSIVE METABOLIC PANEL WITH GFR
ALT: 16 U/L (ref 0–35)
AST: 14 U/L (ref 0–37)
Albumin: 4.5 g/dL (ref 3.5–5.2)
Alkaline Phosphatase: 95 U/L (ref 39–117)
BUN: 8 mg/dL (ref 6–23)
CO2: 28 meq/L (ref 19–32)
Calcium: 9.5 mg/dL (ref 8.4–10.5)
Chloride: 106 meq/L (ref 96–112)
Creatinine, Ser: 0.81 mg/dL (ref 0.40–1.20)
GFR: 79.17 mL/min (ref >60.00)
Glucose, Bld: 106 mg/dL — ABNORMAL HIGH (ref 70–99)
Potassium: 3.8 meq/L (ref 3.5–5.1)
Sodium: 143 meq/L (ref 135–145)
Total Bilirubin: 0.2 mg/dL (ref 0.2–1.2)
Total Protein: 7 g/dL (ref 6.0–8.3)

## 2023-11-08 LAB — VITAMIN D 25 HYDROXY (VIT D DEFICIENCY, FRACTURES): VITD: 10.02 ng/mL — ABNORMAL LOW (ref 30.00–100.00)

## 2023-11-08 LAB — VITAMIN B12: Vitamin B-12: 456 pg/mL (ref 211–911)

## 2023-11-08 LAB — HIV ANTIBODY (ROUTINE TESTING W REFLEX): HIV 1&2 Ab, 4th Generation: NONREACTIVE

## 2023-11-08 LAB — HEMOGLOBIN A1C: Hgb A1c MFr Bld: 6.5 % (ref 4.6–6.5)

## 2023-11-18 ENCOUNTER — Encounter: Payer: Self-pay | Admitting: Family Medicine

## 2023-11-18 DIAGNOSIS — Z13220 Encounter for screening for lipoid disorders: Secondary | ICD-10-CM | POA: Insufficient documentation

## 2023-11-18 DIAGNOSIS — E118 Type 2 diabetes mellitus with unspecified complications: Secondary | ICD-10-CM | POA: Insufficient documentation

## 2023-11-18 DIAGNOSIS — J449 Chronic obstructive pulmonary disease, unspecified: Secondary | ICD-10-CM | POA: Insufficient documentation

## 2023-11-18 DIAGNOSIS — E538 Deficiency of other specified B group vitamins: Secondary | ICD-10-CM | POA: Insufficient documentation

## 2023-11-18 DIAGNOSIS — I10 Essential (primary) hypertension: Secondary | ICD-10-CM | POA: Insufficient documentation

## 2023-11-18 DIAGNOSIS — E1159 Type 2 diabetes mellitus with other circulatory complications: Secondary | ICD-10-CM | POA: Insufficient documentation

## 2023-11-18 DIAGNOSIS — I152 Hypertension secondary to endocrine disorders: Secondary | ICD-10-CM | POA: Insufficient documentation

## 2023-11-18 DIAGNOSIS — Z122 Encounter for screening for malignant neoplasm of respiratory organs: Secondary | ICD-10-CM | POA: Insufficient documentation

## 2023-11-18 DIAGNOSIS — Z114 Encounter for screening for human immunodeficiency virus [HIV]: Secondary | ICD-10-CM | POA: Insufficient documentation

## 2023-11-18 MED ORDER — VITAMIN D (ERGOCALCIFEROL) 1.25 MG (50000 UNIT) PO CAPS
50000.0000 [IU] | ORAL_CAPSULE | ORAL | 3 refills | Status: AC
Start: 2023-11-18 — End: ?
  Filled 2024-04-28: qty 4, 28d supply, fill #0

## 2023-11-18 NOTE — Assessment & Plan Note (Signed)
 Refill Trazadone.

## 2023-11-18 NOTE — Assessment & Plan Note (Signed)
 Continues to smoke, reduced to one pack every three days. Discussed impact on respiratory health and importance of cessation. Expressed readiness to quit. - Discuss smoking cessation options including 1-800-QUIT-NOW, patches, gum, and potential use of Wellbutrin.

## 2023-11-18 NOTE — Assessment & Plan Note (Signed)
 Elevated today Start  Amlodipine  2.5 mg at night.  Likely will need to increase at next visit Check Cmet

## 2023-11-18 NOTE — Assessment & Plan Note (Signed)
-   Refill Omeprazole

## 2023-11-18 NOTE — Assessment & Plan Note (Signed)
 COPD exacerbation likely due to increased coughing and shortness of breath. Educated on smoking cessation to improve respiratory function. - Order Breztri  inhaler and provide sample if available. - Provide copay card for Breztri  inhaler. - Order chest x-ray. - Refill albuterol  inhaler. - Advise increased use of albuterol  inhaler during exacerbation. - Continue Singulair . - Discuss smoking cessation options including 1-800-QUIT-NOW, patches, gum, and potential use of Wellbutrin.

## 2023-11-18 NOTE — Assessment & Plan Note (Signed)
 A1c .  New diagnosis Recommend starting Metformin Will need to review diagnosis at next visit with patient and recommendations. Currently on statin Recommend ARB for BP Check UAC FOOT and Exam annually Consider Diabetic nurse for education

## 2023-11-23 ENCOUNTER — Other Ambulatory Visit: Payer: Self-pay | Admitting: Family Medicine

## 2023-11-23 DIAGNOSIS — E118 Type 2 diabetes mellitus with unspecified complications: Secondary | ICD-10-CM

## 2023-11-23 MED ORDER — METFORMIN HCL ER 500 MG PO TB24
500.0000 mg | ORAL_TABLET | Freq: Every day | ORAL | 1 refills | Status: AC
Start: 2023-11-23 — End: ?
  Filled 2024-01-09: qty 30, 30d supply, fill #0
  Filled 2024-04-28: qty 30, 30d supply, fill #1

## 2023-11-27 ENCOUNTER — Telehealth: Payer: Self-pay | Admitting: *Deleted

## 2023-11-27 ENCOUNTER — Other Ambulatory Visit: Payer: Self-pay | Admitting: *Deleted

## 2023-11-27 DIAGNOSIS — Z122 Encounter for screening for malignant neoplasm of respiratory organs: Secondary | ICD-10-CM

## 2023-11-27 DIAGNOSIS — Z87891 Personal history of nicotine dependence: Secondary | ICD-10-CM

## 2023-11-27 DIAGNOSIS — F17210 Nicotine dependence, cigarettes, uncomplicated: Secondary | ICD-10-CM

## 2023-11-27 NOTE — Telephone Encounter (Signed)
 Lung Cancer Screening Narrative/Criteria Questionnaire (Cigarette Smokers Only- No Cigars/Pipes/vapes)   Teresa Deleon   SDMV:12/18/23 8:00- [name]                                           1966/05/26              LDCT: 12/18/23 11:00 - MCM    57 y.o.   Phone: [phone]  Lung Screening Narrative (confirm age 36-77 yrs Medicare / 50-80 yrs Private pay insurance)   Insurance information:[employer]   Referring Provider:[facility]   This screening involves an initial phone call with a team member from our program. It is called a shared decision making visit. The initial meeting is required by insurance and Medicare to make sure you understand the program. This appointment takes about 15-20 minutes to complete. The CT scan will completed at a separate date/time. This scan takes about 5-10 minutes to complete and you may eat and drink before and after the scan.  Criteria questions for Lung Cancer Screening:   Are you a current or former smoker? Current Age began smoking: 86   If you are a former smoker, what year did you quit smoking) (within 15 yrs)   To calculate your smoking history, I need an accurate estimate of how many packs of cigarettes you smoked per day and for how many years. (Not just the number of PPD you are now smoking)   Years smoking 43 x Packs per day 1-2 = Pack years 63   (at least 20 pack yrs)   (Make sure they understand that we need to know how much they have smoked in the past, not just the number of PPD they are smoking now)  Do you have a personal history of cancer?  No    Do you have a family history of cancer? Yes  (cancer type and and relative) Uncle (lymphoma)  Are you coughing up blood?  No  Have you had unexplained weight loss of 15 lbs or more in the last 6 months? No  It looks like you meet all criteria.     Additional information: N/A

## 2023-11-30 ENCOUNTER — Encounter (HOSPITAL_COMMUNITY): Payer: Self-pay

## 2023-12-07 ENCOUNTER — Encounter: Payer: Self-pay | Admitting: Family Medicine

## 2023-12-07 ENCOUNTER — Ambulatory Visit: Admitting: Family Medicine

## 2023-12-07 ENCOUNTER — Ambulatory Visit: Payer: Self-pay | Admitting: Family Medicine

## 2023-12-07 VITALS — BP 134/80 | HR 70 | Temp 98.0°F | Resp 20 | Ht 60.0 in | Wt 157.4 lb

## 2023-12-07 DIAGNOSIS — Z7984 Long term (current) use of oral hypoglycemic drugs: Secondary | ICD-10-CM

## 2023-12-07 DIAGNOSIS — M5126 Other intervertebral disc displacement, lumbar region: Secondary | ICD-10-CM

## 2023-12-07 DIAGNOSIS — E118 Type 2 diabetes mellitus with unspecified complications: Secondary | ICD-10-CM | POA: Diagnosis not present

## 2023-12-07 DIAGNOSIS — I152 Hypertension secondary to endocrine disorders: Secondary | ICD-10-CM

## 2023-12-07 DIAGNOSIS — E1159 Type 2 diabetes mellitus with other circulatory complications: Secondary | ICD-10-CM | POA: Diagnosis not present

## 2023-12-07 LAB — MICROALBUMIN / CREATININE URINE RATIO
Creatinine,U: 137.1 mg/dL
Microalb Creat Ratio: UNDETERMINED mg/g (ref 0.0–30.0)
Microalb, Ur: <0.7 mg/dL

## 2023-12-07 NOTE — Telephone Encounter (Signed)
 Printed out and left in provider box to sign

## 2023-12-07 NOTE — Progress Notes (Unsigned)
 SUBJECTIVE:   Chief Complaint  Patient presents with   Hypertension    4 week follow up   HPI Presents for chronic disease management  Discussed the use of AI scribe software for clinical note transcription with the patient, who gave verbal consent to proceed.  History of Present Illness Teresa Deleon is a 58 year old female with hypertension who presents for a follow-up on her blood pressure management.  She has not been checking her blood pressure at home and has not picked up her prescribed medication due to financial constraints. She plans to pick it up next week and start taking it this weekend. She has made lifestyle changes, including cutting back on sodas, resuming smoking, and eating more salads.  She does not own a blood pressure cuff at home and is considering purchasing one. She has a CT lung scan scheduled for Dec 18, 2023.  She has a history of a herniated disc and sided nerve damage, which she manages by modifying her movements to avoid aggravating her condition. She has been dealing with back issues for over 20 years, and an MRI revealed the herniated disc. She visited the [facility] for orthopedics, where she was given exercises and physical therapy recommendations, but found the insurance costs prohibitive. She sits for seven and a half hours a day at work.  No current headaches.     PERTINENT PMH / PSH: As above  OBJECTIVE:  BP 134/80   Pulse 70   Temp 98 F (36.7 C)   Resp 20   Ht 5' (1.524 m)   Wt 157 lb 6 oz (71.4 kg)   SpO2 98%   BMI 30.74 kg/m    Physical Exam Vitals reviewed.  Constitutional:      General: She is not in acute distress.    Appearance: Normal appearance. She is obese. She is not ill-appearing, toxic-appearing or diaphoretic.  Eyes:     General:        Right eye: No discharge.        Left eye: No discharge.     Conjunctiva/sclera: Conjunctivae normal.  Cardiovascular:     Rate and Rhythm: Normal rate and regular  rhythm.     Heart sounds: Normal heart sounds.  Pulmonary:     Effort: Pulmonary effort is normal.     Breath sounds: Normal breath sounds.  Musculoskeletal:        General: Normal range of motion.  Skin:    General: Skin is warm and dry.  Neurological:     General: No focal deficit present.     Mental Status: She is alert and oriented to person, place, and time. Mental status is at baseline.  Psychiatric:        Mood and Affect: Mood normal.        Behavior: Behavior normal.        Thought Content: Thought content normal.        Judgment: Judgment normal.           12/07/2023    8:25 AM 11/07/2023    2:50 PM 11/07/2022    8:53 AM 05/03/2022    9:06 AM 04/20/2022    9:04 AM  Depression screen PHQ 2/9  Decreased Interest 0 0 0 0 0  Down, Depressed, Hopeless 0 0 0 0 0  PHQ - 2 Score 0 0 0 0 0  Altered sleeping 1 2     Tired, decreased energy 1 3  Change in appetite 0 0     Feeling bad or failure about yourself  0 0     Trouble concentrating 0 0     Moving slowly or fidgety/restless 0 0     Suicidal thoughts 0 0     PHQ-9 Score 2 5     Difficult doing work/chores Not difficult at all Somewhat difficult         11/07/2023    2:50 PM 01/01/2020   10:09 AM  GAD 7 : Generalized Anxiety Score  Nervous, Anxious, on Edge 0   Control/stop worrying 0 1  Worry too much - different things 2 0  Trouble relaxing 2 0  Restless 0 0  Easily annoyed or irritable 0 0  Afraid - awful might happen 0 0  Total GAD 7 Score 4   Anxiety Difficulty Somewhat difficult Not difficult at all    ASSESSMENT/PLAN:  Type 2 diabetes mellitus with complications [facility]) Assessment & Plan: New diagnosis.   Tolerating  Metformin  500 mg daily Will need to review diagnosis at next visit with patient and recommendations. Currently on statin Recommend ARB for BP Check UAC FOOT and Exam annually Consider Diabetic nurse for education  Orders: -     Microalbumin / creatinine urine ratio -     HM  Diabetes Foot Exam  Hypertension associated with diabetes (HCC) Assessment & Plan: Has not been taking prescribed medication Recent diagnosis of DM T2 BP today slightly elevated -Restart Amlodipine  2.5 mg at night -Consider ARB in future - Recommend purchasing a home blood pressure monitor. - Schedule follow-up in two weeks to reassess blood pressure. - Advise on consistent lifestyle modifications, including reduced soda intake and smoking cessation.   Lumbar herniated disc Assessment & Plan: Chronic condition managed conservatively. Occasional discomfort managed with body mechanics. No specialist follow-up due to cost. Seeks ergonomic equipment for work. - Provide ergonomic equipment accommodation for work. - Review previous MRI results and recommendations from [facility]. - Fax necessary documentation for work accommodation. Forms completed and placed in CMA folder     PDMP reviewed  Return in about 11 days (around 12/18/2023) for PCP, HTn.  Valli Gaw, MD

## 2023-12-07 NOTE — Patient Instructions (Addendum)
 It was a pleasure meeting you today. Thank you for allowing me to take part in your health care.  Our goals for today as we discussed include:  Start the Amlodipine  2.5 mg tonight Monitor blood pressure.  Goal <130/80  Follow up in 2 weeks  Check with pharmacy to get refill of Breztri   Referral sent for Mammogram. Please call to schedule appointment. [facility] [address] [place], [place] [address] [phone]     This is a list of the screening recommended for you and due dates:  Health Maintenance  Topic Date Due   Eye exam for diabetics  Never done   Yearly kidney health urinalysis for diabetes  Never done   Zoster (Shingles) Vaccine (1 of 2) Never done   Mammogram  01/13/2016   COVID-19 Vaccine (3 - 2024-25 season) 03/25/2023   Flu Shot  02/22/2024   Hemoglobin A1C  05/08/2024   Yearly kidney function blood test for diabetes  11/06/2024   Complete foot exam   12/06/2024   Pap with HPV screening  05/04/2027   Colon Cancer Screening  09/17/2030   DTaP/Tdap/Td vaccine (9 - Td or Tdap) 11/06/2032   Pneumococcal Vaccination  Completed   Hepatitis C Screening  Completed   HIV Screening  Completed   HPV Vaccine  Aged Out   Meningitis B Vaccine  Aged Out    If you have any questions or concerns, please do not hesitate to call the office at [phone].  I look forward to our next visit and until then take care and stay safe.  Regards,   Valli Gaw, MD   [facility]

## 2023-12-14 ENCOUNTER — Telehealth: Payer: Self-pay

## 2023-12-14 NOTE — Telephone Encounter (Signed)
 Copied from CRM [id number]. Topic: General - Other >> Dec 14, 2023  3:52 PM Martinique E wrote: Reason for CRM: Patient called in wanting confirmation that her medical accomodation form has been faxed over to her employment. Patient stated the fax number is listed on that form. Callback number for patient is [phone].

## 2023-12-16 ENCOUNTER — Encounter: Payer: Self-pay | Admitting: Family Medicine

## 2023-12-16 NOTE — Assessment & Plan Note (Addendum)
 Has not been taking prescribed medication Recent diagnosis of DM T2 BP today slightly elevated -Restart Amlodipine  2.5 mg at night -Consider ARB in future - Recommend purchasing a home blood pressure monitor. - Schedule follow-up in two weeks to reassess blood pressure. - Advise on consistent lifestyle modifications, including reduced soda intake and smoking cessation.

## 2023-12-16 NOTE — Assessment & Plan Note (Signed)
 New diagnosis.   Tolerating  Metformin  500 mg daily Will need to review diagnosis at next visit with patient and recommendations. Currently on statin Recommend ARB for BP Check UAC FOOT and Exam annually Consider Diabetic nurse for education

## 2023-12-16 NOTE — Assessment & Plan Note (Signed)
 Chronic condition managed conservatively. Occasional discomfort managed with body mechanics. No specialist follow-up due to cost. Seeks ergonomic equipment for work. - Provide ergonomic equipment accommodation for work. - Review previous MRI results and recommendations from [facility]. - Fax necessary documentation for work accommodation. Forms completed and placed in CMA folder

## 2023-12-18 ENCOUNTER — Ambulatory Visit: Admitting: *Deleted

## 2023-12-18 ENCOUNTER — Ambulatory Visit
Admission: RE | Admit: 2023-12-18 | Discharge: 2023-12-18 | Disposition: A | Discharge status: Home / Self Care | Source: Ambulatory Visit | Attending: Acute Care | Admitting: Acute Care

## 2023-12-18 DIAGNOSIS — Z87891 Personal history of nicotine dependence: Secondary | ICD-10-CM | POA: Insufficient documentation

## 2023-12-18 DIAGNOSIS — F17210 Nicotine dependence, cigarettes, uncomplicated: Secondary | ICD-10-CM | POA: Diagnosis not present

## 2023-12-18 DIAGNOSIS — Z122 Encounter for screening for malignant neoplasm of respiratory organs: Secondary | ICD-10-CM | POA: Insufficient documentation

## 2023-12-18 NOTE — Telephone Encounter (Signed)
 Copied from CRM [id number]. Topic: General - Other >> Dec 18, 2023  3:29 PM Adonis Hoot wrote: Reason for CRM: Patient returned call to [facility].I let her know that her accommodations forms were faxed back to her employer.

## 2023-12-18 NOTE — Patient Instructions (Signed)

## 2023-12-18 NOTE — Telephone Encounter (Signed)
 Noted

## 2023-12-18 NOTE — Progress Notes (Signed)
 Virtual Visit via Telephone Note  I connected with Teresa Deleon on 12/18/23 at  8:00 AM EDT by telephone and verified that I am speaking with the correct person using two identifiers.  Location: Patient: Teresa Deleon Provider: Alyse Bach, RN   I discussed the limitations, risks, security and privacy concerns of performing an evaluation and management service by telephone and the availability of in person appointments. I also discussed with the patient that there may be a patient responsible charge related to this service. The patient expressed understanding and agreed to proceed.   Shared Decision Making Visit Lung Cancer Screening Program [phone])   Eligibility: Age 58 y.o. Pack Years Smoking History Calculation 65 (# packs/per year x # years smoked) Recent History of coughing up blood  no Unexplained weight loss? no ( >Than 15 pounds within the last 6 months ) Prior History Lung / other cancer no (Diagnosis within the last 5 years already requiring surveillance chest CT Scans). Smoking Status Current Smoker Former Smokers: Years since quit: n/a  Quit Date: n/a  Visit Components: Discussion included one or more decision making aids. yes Discussion included risk/benefits of screening. yes Discussion included potential follow up diagnostic testing for abnormal scans. yes Discussion included meaning and risk of over diagnosis. yes Discussion included meaning and risk of False Positives. yes Discussion included meaning of total radiation exposure. yes  Counseling Included: Importance of adherence to annual lung cancer LDCT screening. yes Impact of comorbidities on ability to participate in the program. yes Ability and willingness to under diagnostic treatment. yes  Smoking Cessation Counseling: Current Smokers:  Discussed importance of smoking cessation. yes Information about tobacco cessation classes and interventions provided to patient. yes Patient provided  with "ticket" for LDCT Scan. no Symptomatic Patient. no  Counseling(Intermediate counseling: > three minutes) 99406 Diagnosis Code: Tobacco Use Z72.0 Asymptomatic Patient yes  Counseling (Intermediate counseling: > three minutes counseling) U0454 Former Smokers:  Discussed the importance of maintaining cigarette abstinence. yes Diagnosis Code: Personal History of Nicotine  Dependence. U98.119 Information about tobacco cessation classes and interventions provided to patient. Yes Patient provided with "ticket" for LDCT Scan. no Written Order for Lung Cancer Screening with LDCT placed in Epic. Yes (CT Chest Lung Cancer Screening Low Dose W/O CM) [id number] Z12.2-Screening of respiratory organs Z87.891-Personal history of nicotine  dependence   Alyse Bach, RN

## 2023-12-18 NOTE — Telephone Encounter (Signed)
 Left message to return call to our office.  Forms have been faxed over to number on the top of the form.

## 2023-12-25 ENCOUNTER — Ambulatory Visit (INDEPENDENT_AMBULATORY_CARE_PROVIDER_SITE_OTHER): Admitting: Family Medicine

## 2023-12-25 ENCOUNTER — Other Ambulatory Visit: Payer: Self-pay

## 2023-12-25 ENCOUNTER — Encounter: Payer: Self-pay | Admitting: Family Medicine

## 2023-12-25 VITALS — BP 138/80 | HR 57 | Temp 97.8°F | Resp 20 | Ht 60.0 in | Wt 157.2 lb

## 2023-12-25 DIAGNOSIS — I152 Hypertension secondary to endocrine disorders: Secondary | ICD-10-CM

## 2023-12-25 DIAGNOSIS — E1169 Type 2 diabetes mellitus with other specified complication: Secondary | ICD-10-CM | POA: Diagnosis not present

## 2023-12-25 DIAGNOSIS — E118 Type 2 diabetes mellitus with unspecified complications: Secondary | ICD-10-CM | POA: Diagnosis not present

## 2023-12-25 DIAGNOSIS — E785 Hyperlipidemia, unspecified: Secondary | ICD-10-CM

## 2023-12-25 DIAGNOSIS — E1159 Type 2 diabetes mellitus with other circulatory complications: Secondary | ICD-10-CM

## 2023-12-25 MED ORDER — AMLODIPINE BESYLATE 2.5 MG PO TABS
2.5000 mg | ORAL_TABLET | Freq: Every day | ORAL | 0 refills | Status: DC
  Filled 2023-12-25: qty 30, 30d supply, fill #0
  Filled 2024-04-28: qty 30, 30d supply, fill #1

## 2023-12-25 MED ORDER — ATORVASTATIN CALCIUM 40 MG PO TABS
40.0000 mg | ORAL_TABLET | Freq: Every day | ORAL | 1 refills | Status: DC
  Filled 2023-12-25: qty 30, 30d supply, fill #0
  Filled 2024-04-28: qty 30, 30d supply, fill #1

## 2023-12-25 NOTE — Assessment & Plan Note (Signed)
 Cholesterol remains high. Inconsistent atorvastatin  use. Goal is <70 mg/dL.  Currently taking Lipitor 20 mg intermittently.  Had been increased previously to 40 mg daily. - Instruct to take two 20 mg atorvastatin  tablets daily until completed 20 mg tablets. - Prescription sent for Lipitor 40 mg daily

## 2023-12-25 NOTE — Assessment & Plan Note (Signed)
 Blood pressure elevated at 138/80 mmHg. She has not started amlodipine . Target is <130/80 mmHg. Emphasized medication importance to prevent myocardial infarction or cerebrovascular accident. - Refill amlodipine  for 3 months. - Instruct to start amlodipine  as prescribed. - Schedule follow-up in 3 months for blood pressure assessment.

## 2023-12-25 NOTE — Progress Notes (Signed)
 SUBJECTIVE:   Chief Complaint  Patient presents with   Hypertension   HPI Presents for follow up chronic disease management  Discussed the use of AI scribe software for clinical note transcription with the patient, who gave verbal consent to proceed.  History of Present Illness Teresa Deleon is a 58 year old female with hypertension and diabetes who presents for follow-up on her blood pressure management.  She has not been monitoring her blood pressure at home and has not initiated her prescribed amlodipine . Her blood pressure remains elevated at 138/80 mmHg, with a target of less than 130/80 mmHg.  She has not started metformin  for diabetes management. Her last recorded HbA1c was 6.5%. She has made dietary changes, including increased consumption of salads and baked foods, and has reduced soda intake from two liters to two 20-ounce sodas per day.  She takes her cholesterol medication inconsistently, approximately every other day, and her cholesterol levels remain above the target of less than 70 mg/dL.  She has a history of smoking and experienced shortness of breath after smoking yesterday.  She had an eye exam at the end of April.     PERTINENT PMH / PSH: As above  OBJECTIVE:  BP 138/80   Pulse (!) 57   Temp 97.8 F (36.6 C)   Resp 20   Ht 5' (1.524 m)   Wt 157 lb 4 oz (71.3 kg)   SpO2 98%   BMI 30.71 kg/m    Physical Exam Vitals reviewed.  Constitutional:      General: She is not in acute distress.    Appearance: Normal appearance. She is normal weight. She is not ill-appearing, toxic-appearing or diaphoretic.  Eyes:     General:        Right eye: No discharge.        Left eye: No discharge.     Conjunctiva/sclera: Conjunctivae normal.  Cardiovascular:     Rate and Rhythm: Normal rate and regular rhythm.     Heart sounds: Normal heart sounds.  Pulmonary:     Effort: Pulmonary effort is normal.     Breath sounds: Normal breath sounds.   Musculoskeletal:        General: Normal range of motion.  Skin:    General: Skin is warm and dry.  Neurological:     General: No focal deficit present.     Mental Status: She is alert and oriented to person, place, and time. Mental status is at baseline.  Psychiatric:        Mood and Affect: Mood normal.        Behavior: Behavior normal.        Thought Content: Thought content normal.        Judgment: Judgment normal.           12/25/2023    7:53 AM 12/07/2023    8:25 AM 11/07/2023    2:50 PM 11/07/2022    8:53 AM 05/03/2022    9:06 AM  Depression screen PHQ 2/9  Decreased Interest 0 0 0 0 0  Down, Depressed, Hopeless 0 0 0 0 0  PHQ - 2 Score 0 0 0 0 0  Altered sleeping 1 1 2     Tired, decreased energy 1 1 3     Change in appetite 0 0 0    Feeling bad or failure about yourself  0 0 0    Trouble concentrating 0 0 0    Moving slowly or fidgety/restless 0 0 0  Suicidal thoughts 0 0 0    PHQ-9 Score 2 2 5     Difficult doing work/chores Not difficult at all Not difficult at all Somewhat difficult        12/25/2023    7:53 AM 11/07/2023    2:50 PM 01/01/2020   10:09 AM  GAD 7 : Generalized Anxiety Score  Nervous, Anxious, on Edge 0 0   Control/stop worrying 0 0 1  Worry too much - different things 0 2 0  Trouble relaxing 0 2 0  Restless 0 0 0  Easily annoyed or irritable 0 0 0  Afraid - awful might happen 0 0 0  Total GAD 7 Score 0 4   Anxiety Difficulty Not difficult at all Somewhat difficult Not difficult at all    ASSESSMENT/PLAN:  Hypertension associated with diabetes (HCC) Assessment & Plan: Blood pressure elevated at 138/80 mmHg. She has not started amlodipine . Target is <130/80 mmHg. Emphasized medication importance to prevent myocardial infarction or cerebrovascular accident. - Refill amlodipine  for 3 months. - Instruct to start amlodipine  as prescribed. - Schedule follow-up in 3 months for blood pressure assessment.  Orders: -     amLODIPine  Besylate; Take  1 tablet (2.5 mg total) by mouth daily.  Dispense: 90 tablet; Refill: 0  Hyperlipidemia associated with type 2 diabetes mellitus (HCC) Assessment & Plan: Cholesterol remains high. Inconsistent atorvastatin  use. Goal is <70 mg/dL.  Currently taking Lipitor 20 mg intermittently.  Had been increased previously to 40 mg daily. - Instruct to take two 20 mg atorvastatin  tablets daily until completed 20 mg tablets. - Prescription sent for Lipitor 40 mg daily   Orders: -     Atorvastatin  Calcium ; Take 1 tablet (40 mg total) by mouth daily. At night  Dispense: 90 tablet; Refill: 1  Type 2 diabetes mellitus with complications (HCC) Assessment & Plan: Hemoglobin A1c at 6.5% indicates diabetes. She has not started metformin . Emphasized diet and exercise for glucose management. Metformin  prescribed to prevent progression, optional with strict diet and exercise adherence. - Instruct to start metformin  if not adhering to diabetic diet. - Encourage diabetic diet adherence and regular physical activity. - Recommend annual eye and foot exams.     PDMP reviewed  Return in about 3 months (around 03/26/2024) for PCP, DM.  Valli Gaw, MD

## 2023-12-25 NOTE — Patient Instructions (Addendum)
 It was a pleasure meeting you today. Thank you for allowing me to take part in your health care.  Our goals for today as we discussed include:  Recommend to start the following Metformin  as previously prescribed for Diabetes  For blood pressure Recommend to start Amlodipine  as previously prescribed Monitor blood pressure at home.  Goal <130/80  Increase Lipitor to 40 mg daily. Take 2 tablets daily.  Once you have completed your current prescription then you will take 1 tablet daily.  Referral sent for Mammogram. Please call to schedule appointment. [facility] [address] [place], [place] [address] [phone]    Follow up in 3 months   This is a list of the screening recommended for you and due dates:  Health Maintenance  Topic Date Due   Eye exam for diabetics  Never done   Screening for Lung Cancer  Never done   Zoster (Shingles) Vaccine (1 of 2) Never done   Mammogram  01/13/2016   COVID-19 Vaccine (3 - 2024-25 season) 03/25/2023   Flu Shot  02/22/2024   Hemoglobin A1C  05/08/2024   Yearly kidney function blood test for diabetes  11/06/2024   Yearly kidney health urinalysis for diabetes  12/06/2024   Complete foot exam   12/06/2024   Pap with HPV screening  05/04/2027   Colon Cancer Screening  09/17/2030   DTaP/Tdap/Td vaccine (9 - Td or Tdap) 11/06/2032   Pneumococcal Vaccination  Completed   Hepatitis C Screening  Completed   HIV Screening  Completed   HPV Vaccine  Aged Out   Meningitis B Vaccine  Aged Out     If you have any questions or concerns, please do not hesitate to call the office at [phone].  I look forward to our next visit and until then take care and stay safe.  Regards,   Valli Gaw, MD   [facility]

## 2023-12-25 NOTE — Assessment & Plan Note (Signed)
 Hemoglobin A1c at 6.5% indicates diabetes. She has not started metformin . Emphasized diet and exercise for glucose management. Metformin  prescribed to prevent progression, optional with strict diet and exercise adherence. - Instruct to start metformin  if not adhering to diabetic diet. - Encourage diabetic diet adherence and regular physical activity. - Recommend annual eye and foot exams.

## 2024-01-03 ENCOUNTER — Other Ambulatory Visit: Payer: Self-pay

## 2024-01-04 ENCOUNTER — Other Ambulatory Visit: Payer: Self-pay

## 2024-01-04 ENCOUNTER — Other Ambulatory Visit (HOSPITAL_COMMUNITY): Payer: Self-pay

## 2024-01-07 ENCOUNTER — Other Ambulatory Visit: Payer: Self-pay

## 2024-01-07 DIAGNOSIS — Z87891 Personal history of nicotine dependence: Secondary | ICD-10-CM

## 2024-01-07 DIAGNOSIS — F17210 Nicotine dependence, cigarettes, uncomplicated: Secondary | ICD-10-CM

## 2024-01-07 DIAGNOSIS — Z122 Encounter for screening for malignant neoplasm of respiratory organs: Secondary | ICD-10-CM

## 2024-01-09 ENCOUNTER — Other Ambulatory Visit: Payer: Self-pay

## 2024-01-09 ENCOUNTER — Encounter: Payer: Self-pay | Admitting: Pharmacist

## 2024-01-09 MED ORDER — AMLODIPINE BESYLATE 2.5 MG PO TABS: 2.5000 mg | ORAL_TABLET | Freq: Every day | ORAL | 0 refills | Status: DC

## 2024-04-14 ENCOUNTER — Telehealth: Payer: Self-pay

## 2024-04-14 NOTE — Telephone Encounter (Signed)
 Error

## 2024-04-15 ENCOUNTER — Encounter: Admitting: Nurse Practitioner

## 2024-04-28 ENCOUNTER — Other Ambulatory Visit: Payer: Self-pay

## 2024-05-14 ENCOUNTER — Encounter: Payer: Self-pay | Admitting: Emergency Medicine

## 2024-05-14 ENCOUNTER — Other Ambulatory Visit: Payer: Self-pay

## 2024-05-14 ENCOUNTER — Ambulatory Visit
Admission: EM | Admit: 2024-05-14 | Discharge: 2024-05-14 | Disposition: A | Discharge status: Home / Self Care | Attending: Emergency Medicine | Admitting: Emergency Medicine

## 2024-05-14 ENCOUNTER — Ambulatory Visit (INDEPENDENT_AMBULATORY_CARE_PROVIDER_SITE_OTHER)

## 2024-05-14 DIAGNOSIS — J441 Chronic obstructive pulmonary disease with (acute) exacerbation: Secondary | ICD-10-CM | POA: Diagnosis present

## 2024-05-14 DIAGNOSIS — R051 Acute cough: Secondary | ICD-10-CM | POA: Diagnosis not present

## 2024-05-14 LAB — SARS CORONAVIRUS 2 BY RT PCR: SARS Coronavirus 2 by RT PCR: NEGATIVE

## 2024-05-14 MED ORDER — BENZONATATE 100 MG PO CAPS
200.0000 mg | ORAL_CAPSULE | Freq: Three times a day (TID) | ORAL | 0 refills | Status: DC
  Filled 2024-05-14: qty 21, 4d supply, fill #0

## 2024-05-14 MED ORDER — PROMETHAZINE-DM 6.25-15 MG/5ML PO SYRP
5.0000 mL | ORAL_SOLUTION | Freq: Four times a day (QID) | ORAL | 0 refills | Status: DC | PRN
  Filled 2024-05-14: qty 118, 6d supply, fill #0

## 2024-05-14 MED ORDER — IPRATROPIUM-ALBUTEROL 0.5-2.5 (3) MG/3ML IN SOLN
3.0000 mL | Freq: Once | RESPIRATORY_TRACT | Status: AC
  Administered 2024-05-14: 3 mL via RESPIRATORY_TRACT

## 2024-05-14 MED ORDER — IPRATROPIUM BROMIDE 0.06 % NA SOLN
2.0000 | Freq: Four times a day (QID) | NASAL | 12 refills | Status: AC
  Filled 2024-05-14: qty 15, 19d supply, fill #0
  Filled 2024-06-26: qty 15, 19d supply, fill #1

## 2024-05-14 MED ORDER — ALBUTEROL SULFATE (2.5 MG/3ML) 0.083% IN NEBU
2.5000 mg | INHALATION_SOLUTION | Freq: Four times a day (QID) | RESPIRATORY_TRACT | 12 refills | Status: AC | PRN
  Filled 2024-05-14: qty 75, 7d supply, fill #0

## 2024-05-14 NOTE — ED Triage Notes (Signed)
 Pt presents with a cough and sob x 2-3 days. Pt has a history of COPD. She has used her inhaler, taken cough syrup and tylenol  for her symptoms.

## 2024-05-14 NOTE — Discharge Instructions (Addendum)
 Your COVID test is negative and your chest x-ray did not show any evidence of pneumonia.  I do believe you have a COPD exacerbation as a result of respiratory viral infection.  Use the albuterol  nebulizer every 4-6 hours as needed for any shortness breath or wheezing.  Continue to use your Breztri  to help decrease pulmonary inflammation.Use the Atrovent  nasal spray, 2 squirts in each nostril every 6 hours, as needed for runny nose and postnasal drip.  Use the Tessalon  Perles every 8 hours during the day.  Take them with a small sip of water.  They may give you some numbness to the base of your tongue or a metallic taste in your mouth, this is normal.  Use the Promethazine  DM cough syrup at bedtime for cough and congestion.  It will make you drowsy so do not take it during the day.  Return for reevaluation or see your primary care provider for any new or worsening symptoms.

## 2024-05-14 NOTE — ED Provider Notes (Signed)
 [facility]    CSN: [id number] Arrival date & time: 05/14/24  9171      History   Chief Complaint Chief Complaint  Patient presents with   Shortness of Breath   Cough    HPI DAISEE CENTNER is a 58 y.o. female.   HPI  58 year old female with past medical history significant for asthma, COPD, leaky heart valve, migraine headaches, prediabetes, GERD, and heart murmur presents for evaluation of respiratory symptoms that began 2 days ago.  These include nasal congestion with nasal discharge that is thick and white, productive cough or thick white sputum, shortness breath, and wheezing.  Her grandson and daughter are both sick with respiratory symptoms.  She denies any fever or recent travel out of the state or country.  Past Medical History:  Diagnosis Date   Asthma    Back pain    Chronic sinusitis    COVID 06/2021   06/2021   GERD (gastroesophageal reflux disease)    Heart murmur    Leaky heart valve    Migraine    Migraine    Nausea and vomiting 03/05/2019   Pre-diabetes    Trichomonas infection     Patient Active Problem List   Diagnosis Date Noted   Chronic obstructive pulmonary disease (HCC) 11/18/2023   Screening for lung cancer 11/18/2023   Lipid screening 11/18/2023   Vitamin B 12 deficiency 11/18/2023   Encounter for screening for HIV 11/18/2023   Hypertension associated with diabetes (HCC) 11/18/2023   Type 2 diabetes mellitus with complications (HCC) 11/18/2023   Breast cancer screening by mammogram 11/12/2022   Obesity (BMI 30-39.9) 11/12/2022   Need for Tdap vaccination 11/12/2022   Need for pneumococcal 20-valent conjugate vaccination 11/12/2022   Mild intermittent asthma 11/12/2022   Elevated blood pressure reading 11/12/2022   Dysphagia 08/19/2020   Colon cancer screening 08/19/2020   Suspected COVID-19 virus infection 06/12/2020   Bronchitis with asthma, acute 06/12/2020   Abnormal MRI, lumbar spine 04/19/2020   Cervicalgia  04/19/2020   Lumbar radiculopathy 04/19/2020   Vitamin D  deficiency 01/26/2020   Hyperlipidemia associated with type 2 diabetes mellitus (HCC) 01/26/2020   RLS (restless legs syndrome) 01/01/2020   Frontal sinusitis 01/01/2020   Cigarette nicotine  dependence without complication 01/01/2020   Lumbar herniated disc 06/24/2019   Insomnia 06/24/2019   Female pelvic pain 06/24/2019   Diarrhea 03/05/2019   Nausea and vomiting 03/05/2019   COVID-19 virus detected 02/17/2019   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   Chronic sinusitis 04/04/2018   Allergic rhinitis 04/04/2018   Tobacco abuse 04/04/2018   Migraine without status migrainosus, not intractable 04/04/2018   Cardiac murmur 04/04/2018   Gastroesophageal reflux disease 04/04/2018   Fibroids 04/04/2018    Past Surgical History:  Procedure Laterality Date   BREAST SURGERY     left breast abcess   COLONOSCOPY WITH PROPOFOL  N/A 09/17/2020   Procedure: COLONOSCOPY WITH PROPOFOL ;  Surgeon: Therisa Bi, MD;  Location: [facility] ENDOSCOPY;  Service: Gastroenterology;  Laterality: N/A;   ESOPHAGOGASTRODUODENOSCOPY (EGD) WITH PROPOFOL  N/A 09/17/2020   Procedure: ESOPHAGOGASTRODUODENOSCOPY (EGD) WITH PROPOFOL ;  Surgeon: Therisa Bi, MD;  Location: [facility] ENDOSCOPY;  Service: Gastroenterology;  Laterality: N/A;   INSERTION OF MESH N/A 05/12/2022   Procedure: INSERTION OF MESH;  Surgeon: Rodolph Romano, MD;  Location: [facility] ORS;  Service: General;  Laterality: N/A;    OB History   No obstetric history on file.      Home Medications  Prior to Admission medications   Medication Sig Start Date End Date Taking? Authorizing Provider  albuterol  (PROVENTIL ) (2.5 MG/3ML) 0.083% nebulizer solution Take 3 mLs (2.5 mg total) by nebulization every 6 (six) hours as needed for wheezing or shortness of breath. 05/14/24  Yes Bernardino Ditch, NP  benzonatate  (TESSALON ) 100 MG capsule Take 2 capsules (200 mg total) by mouth  every 8 (eight) hours. 05/14/24  Yes Bernardino Ditch, NP  ipratropium (ATROVENT ) 0.06 % nasal spray Place 2 sprays into both nostrils 4 (four) times daily. 05/14/24  Yes Bernardino Ditch, NP  promethazine -dextromethorphan  (PROMETHAZINE -DM) 6.25-15 MG/5ML syrup Take 5 mLs by mouth 4 (four) times daily as needed. 05/14/24  Yes Bernardino Ditch, NP  albuterol  (VENTOLIN  HFA) 108 (90 Base) MCG/ACT inhaler Inhale 2 puffs into the lungs every 6 (six) hours as needed. 11/07/22   Hope Merle, MD  amLODipine  (NORVASC ) 2.5 MG tablet Take 1 tablet (2.5 mg total) by mouth daily. 12/25/23   Hope Merle, MD  amLODipine  (NORVASC ) 2.5 MG tablet Take 1 tablet (2.5 mg total) by mouth daily. 11/07/23   Hope Merle, MD  atorvastatin  (LIPITOR) 40 MG tablet Take 1 tablet (40 mg total) by mouth daily at night 12/25/23   Hope Merle, MD  baclofen  (LIORESAL ) 10 MG tablet Take 1 tablet (10 mg total) by mouth 3 (three) times daily. 09/25/23   Bernardino Ditch, NP  budesonide -glycopyrrolate -formoterol  (BREZTRI  AEROSPHERE) 160-9-4.8 MCG/ACT AERO inhaler Inhale 2 puffs into the lungs in the morning and at bedtime. 11/07/23   Hope Merle, MD  fluticasone  (FLONASE ) 50 MCG/ACT nasal spray Place 2 sprays into both nostrils daily. Prn max 2 sprays 11/07/22   Hope Merle, MD  ipratropium-albuterol  (DUONEB) 0.5-2.5 (3) MG/3ML SOLN Take 3 mLs by nebulization every 4 (four) hours as needed. 11/07/22   Hope Merle, MD  loratadine  (CLARITIN ) 10 MG tablet Take 1 tablet (10 mg total) by mouth daily as needed for allergies. 11/09/21   McLean-Scocuzza, Randine SAILOR, MD  metFORMIN  (GLUCOPHAGE -XR) 500 MG 24 hr tablet Take 1 tablet (500 mg total) by mouth daily with breakfast. 11/23/23   Hope Merle, MD  montelukast  (SINGULAIR ) 10 MG tablet Take 1 tablet (10 mg total) by mouth at bedtime. 11/07/23   Hope Merle, MD  omeprazole  (PRILOSEC) 40 MG capsule Take 1 capsule (40 mg total) by mouth daily. 30 min before food 11/07/23   Hope Merle, MD  traZODone  (DESYREL ) 50 MG tablet  Take 0.5 tablets (25 mg total) by mouth at bedtime as needed for sleep. 11/07/23   Hope Merle, MD  Vitamin D , Ergocalciferol , (DRISDOL ) 1.25 MG (50000 UNIT) CAPS capsule Take 1 capsule (50,000 Units total) by mouth every 7 (seven) days. 11/18/23   Hope Merle, MD  pantoprazole  (PROTONIX ) 40 MG tablet Take 1 tablet (40 mg total) by mouth daily. 30 min before 04/09/20 08/30/20  McLean-Scocuzza, Randine SAILOR, MD    Family History Family History  Problem Relation Age of Onset   Asthma Mother    COPD Mother    Diabetes Mother    Miscarriages / [place] Mother    Diabetes Father    Heart disease Father    Hypertension Father    Heart failure Father    Heart attack Father    Asthma Daughter    Miscarriages / [place] Daughter    Arthritis Maternal Grandmother    Asthma Maternal Grandmother    Hyperlipidemia Maternal Grandmother    Diabetes Maternal Grandfather    Stroke Paternal Grandmother    Heart disease Brother  Social History Social History   Tobacco Use   Smoking status: Some Days    Current packs/day: 1.50    Average packs/day: 1.5 packs/day for 43.8 years (65.7 ttl pk-yrs)    Types: Cigarettes    Start date: 1982   Smokeless tobacco: Never   Tobacco comments:    1 ppd   Vaping Use   Vaping status: Never Used  Substance Use Topics   Alcohol use: Yes    Comment: occassionally   Drug use: No     Allergies   Shellfish allergy, Strawberry (diagnostic), Tomato, and Prednisone    Review of Systems Review of Systems  Constitutional:  Negative for fever.  HENT:  Positive for congestion and rhinorrhea. Negative for ear pain and sore throat.   Respiratory:  Positive for cough, shortness of breath and wheezing.      Physical Exam Triage Vital Signs ED Triage Vitals  Encounter Vitals Group     BP 05/14/24 0857 (!) 156/75     Girls Systolic BP Percentile --      Girls Diastolic BP Percentile --      Boys Systolic BP Percentile --      Boys Diastolic BP  Percentile --      Pulse Rate 05/14/24 0857 89     Resp 05/14/24 0857 19     Temp 05/14/24 0857 98.5 F (36.9 C)     Temp Source 05/14/24 0857 Oral     SpO2 05/14/24 0857 99 %     Weight 05/14/24 0855 156 lb (70.8 kg)     Height --      Head Circumference --      Peak Flow --      Pain Score 05/14/24 0855 7     Pain Loc --      Pain Education --      Exclude from Growth Chart --    No data found.  Updated Vital Signs BP (!) 156/75 (BP Location: Left Arm)   Pulse 89   Temp 98.5 F (36.9 C) (Oral)   Resp 19   Wt 156 lb (70.8 kg)   SpO2 99%   BMI 30.47 kg/m   Visual Acuity Right Eye Distance:   Left Eye Distance:   Bilateral Distance:    Right Eye Near:   Left Eye Near:    Bilateral Near:     Physical Exam Vitals and nursing note reviewed.  Constitutional:      Appearance: Normal appearance. She is not ill-appearing.  HENT:     Head: Normocephalic and atraumatic.     Right Ear: Tympanic membrane, ear canal and external ear normal. There is no impacted cerumen.     Left Ear: Tympanic membrane, ear canal and external ear normal. There is no impacted cerumen.     Nose: Congestion and rhinorrhea present.     Comments: Nasal mucosa is mildly edematous with thick clear discharge in both naris.    Mouth/Throat:     Mouth: Mucous membranes are moist.     Pharynx: Oropharynx is clear. Posterior oropharyngeal erythema present. No oropharyngeal exudate.     Comments: Mild erythema to the posterior pharynx with clear postnasal drip. Cardiovascular:     Rate and Rhythm: Normal rate and regular rhythm.     Pulses: Normal pulses.     Heart sounds: Normal heart sounds. No murmur heard.    No friction rub. No gallop.  Pulmonary:     Effort: Pulmonary effort is normal.  Breath sounds: Wheezing and rhonchi present. No rales.     Comments: Scattered expiratory wheezing with coarse lung sounds heard in the left lung base posteriorly. Musculoskeletal:     Cervical back: Normal  range of motion and neck supple. No tenderness.  Lymphadenopathy:     Cervical: No cervical adenopathy.  Skin:    General: Skin is warm and dry.     Capillary Refill: Capillary refill takes less than 2 seconds.     Findings: No rash.  Neurological:     General: No focal deficit present.     Mental Status: She is alert and oriented to person, place, and time.      UC Treatments / Results  Labs (all labs ordered are listed, but only abnormal results are displayed) Labs Reviewed  SARS CORONAVIRUS 2 BY RT PCR    EKG   Radiology DG Chest 2 View Result Date: 05/14/2024 EXAM: 2 VIEW(S) XRAY OF THE CHEST 05/14/2024 09:49:16 AM COMPARISON: 09/22/2022 CLINICAL HISTORY: Cough, shortness breath, wheezing. FINDINGS: LUNGS AND PLEURA: No focal pulmonary opacity. No pulmonary edema. No pleural effusion. No pneumothorax. HEART AND MEDIASTINUM: No acute abnormality of the cardiac and mediastinal silhouettes. BONES AND SOFT TISSUES: No acute osseous abnormality. IMPRESSION: 1. No acute cardiopulmonary findings. Electronically signed by: Shahmeer Lateef MD 05/14/2024 10:14 AM EDT RP Workstation: [id number]    Procedures Procedures (including critical care time)  Medications Ordered in UC Medications  ipratropium-albuterol  (DUONEB) 0.5-2.5 (3) MG/3ML nebulizer solution 3 mL (3 mLs Nebulization Given 05/14/24 0948)    Initial Impression / Assessment and Plan / UC Course  I have reviewed the triage vital signs and the nursing notes.  Pertinent labs & imaging results that were available during my care of the patient were reviewed by me and considered in my medical decision making (see chart for details).   Patient is a pleasant 57 year old female presenting for evaluation of 3 days worth of respiratory symptoms as outlined in HPI above.  She is able to speak in full sentences without dyspnea or tachypnea, but deep respiration does trigger a cough followed by mild dyspnea that lasts less than a  minute.  She reports that her daughter and grandson have both been sick with respiratory symptoms but she is unaware of any other sick contacts and she denies travel out of state or country.  Differential diagnose include COVID, influenza, viral respiratory illness, COPD exacerbation.  Due to the fact that she has had symptoms for 3 days I will not test her for influenza at this time but I will test her for COVID.  I will also obtain a chest x-ray to evaluate for any acute cardiopulmonary pathology.  She reports that she has been out of her rescue inhaler and she has not had a breathing treatment in a a while.  I will order a DuoNeb to see if this improves patient's symptoms.  Chest x-ray independent reviewed and evaluated by me.  Impression: Lung fields are well aerated without evidence of infiltrate or effusion.  Cardiomediastinal silhouette appears normal.  Radiology overread is pending. Radiology impression states no acute cardiopulmonary findings.  Patient reports that she cannot find her nebulizer machine at home.  I will have staff dispense her new nebulizer for home use prior to discharge.  COVID PCR is negative.  I will discharge patient with diagnosis of COPD exacerbation.  We have dispensed a nebulizer machine and I will send a prescription for nebulizer solution to the pharmacy.  She may use nebulizers  every 4-6 hours.  I will also prescribe Tessalon  Perles and Promethazine  DM cough syrup cough and congestion.  She is to continue her Breztri  to help decrease pulmonary inflammation   Final Clinical Impressions(s) / UC Diagnoses   Final diagnoses:  Acute cough  COPD exacerbation [facility])     Discharge Instructions      Your COVID test is negative and your chest x-ray did not show any evidence of pneumonia.  I do believe you have a COPD exacerbation as a result of respiratory viral infection.  Use the albuterol  nebulizer every 4-6 hours as needed for any shortness breath or  wheezing.  Continue to use your Breztri  to help decrease pulmonary inflammation.Use the Atrovent  nasal spray, 2 squirts in each nostril every 6 hours, as needed for runny nose and postnasal drip.  Use the Tessalon  Perles every 8 hours during the day.  Take them with a small sip of water.  They may give you some numbness to the base of your tongue or a metallic taste in your mouth, this is normal.  Use the Promethazine  DM cough syrup at bedtime for cough and congestion.  It will make you drowsy so do not take it during the day.  Return for reevaluation or see your primary care provider for any new or worsening symptoms.      ED Prescriptions     Medication Sig Dispense Auth. Provider   albuterol  (PROVENTIL ) (2.5 MG/3ML) 0.083% nebulizer solution Take 3 mLs (2.5 mg total) by nebulization every 6 (six) hours as needed for wheezing or shortness of breath. 75 mL Bernardino Ditch, NP   benzonatate  (TESSALON ) 100 MG capsule Take 2 capsules (200 mg total) by mouth every 8 (eight) hours. 21 capsule Bernardino Ditch, NP   promethazine -dextromethorphan  (PROMETHAZINE -DM) 6.25-15 MG/5ML syrup Take 5 mLs by mouth 4 (four) times daily as needed. 118 mL Bernardino Ditch, NP   ipratropium (ATROVENT ) 0.06 % nasal spray Place 2 sprays into both nostrils 4 (four) times daily. 15 mL Bernardino Ditch, NP      PDMP not reviewed this encounter.   Bernardino Ditch, NP 05/14/24 1040

## 2024-05-21 ENCOUNTER — Other Ambulatory Visit: Payer: Self-pay

## 2024-05-21 ENCOUNTER — Telehealth: Payer: Self-pay

## 2024-05-21 DIAGNOSIS — J4520 Mild intermittent asthma, uncomplicated: Secondary | ICD-10-CM

## 2024-05-21 MED ORDER — ALBUTEROL SULFATE HFA 108 (90 BASE) MCG/ACT IN AERS
2.0000 | INHALATION_SPRAY | Freq: Four times a day (QID) | RESPIRATORY_TRACT | 0 refills | Status: AC | PRN
  Filled 2024-05-21: qty 6.7, 30d supply, fill #0

## 2024-05-21 NOTE — Telephone Encounter (Signed)
Medication has been refilled and pt is aware.  

## 2024-05-21 NOTE — Telephone Encounter (Signed)
 Copied from CRM #[id number]. Topic: Clinical - Medication Question >> May 21, 2024  3:53 PM Alexandria E wrote: Reason for CRM: Patient is needing a a refill of her albuterol  (VENTOLIN  HFA) 108 (90 Base) MCG/ACT inhaler. Previously prescribed by Glenys Ferrari, and patient does have a TOC appointment in November.

## 2024-06-06 ENCOUNTER — Other Ambulatory Visit: Payer: Self-pay

## 2024-06-06 ENCOUNTER — Ambulatory Visit: Admitting: Nurse Practitioner

## 2024-06-06 ENCOUNTER — Encounter: Payer: Self-pay | Admitting: Nurse Practitioner

## 2024-06-06 VITALS — BP 122/82 | HR 67 | Temp 98.0°F | Ht 60.0 in | Wt 154.6 lb

## 2024-06-06 DIAGNOSIS — I152 Hypertension secondary to endocrine disorders: Secondary | ICD-10-CM | POA: Diagnosis not present

## 2024-06-06 DIAGNOSIS — J309 Allergic rhinitis, unspecified: Secondary | ICD-10-CM

## 2024-06-06 DIAGNOSIS — E1169 Type 2 diabetes mellitus with other specified complication: Secondary | ICD-10-CM

## 2024-06-06 DIAGNOSIS — J449 Chronic obstructive pulmonary disease, unspecified: Secondary | ICD-10-CM

## 2024-06-06 DIAGNOSIS — E1159 Type 2 diabetes mellitus with other circulatory complications: Secondary | ICD-10-CM

## 2024-06-06 DIAGNOSIS — E559 Vitamin D deficiency, unspecified: Secondary | ICD-10-CM | POA: Diagnosis not present

## 2024-06-06 DIAGNOSIS — M5126 Other intervertebral disc displacement, lumbar region: Secondary | ICD-10-CM

## 2024-06-06 DIAGNOSIS — Z7984 Long term (current) use of oral hypoglycemic drugs: Secondary | ICD-10-CM

## 2024-06-06 DIAGNOSIS — G8929 Other chronic pain: Secondary | ICD-10-CM

## 2024-06-06 DIAGNOSIS — E118 Type 2 diabetes mellitus with unspecified complications: Secondary | ICD-10-CM | POA: Diagnosis not present

## 2024-06-06 DIAGNOSIS — Z1329 Encounter for screening for other suspected endocrine disorder: Secondary | ICD-10-CM

## 2024-06-06 DIAGNOSIS — M5441 Lumbago with sciatica, right side: Secondary | ICD-10-CM

## 2024-06-06 DIAGNOSIS — E785 Hyperlipidemia, unspecified: Secondary | ICD-10-CM

## 2024-06-06 LAB — BASIC METABOLIC PANEL WITH GFR
BUN: 8 mg/dL (ref 6–23)
CO2: 27 meq/L (ref 19–32)
Calcium: 9.5 mg/dL (ref 8.4–10.5)
Chloride: 106 meq/L (ref 96–112)
Creatinine, Ser: 0.81 mg/dL (ref 0.40–1.20)
GFR: 78.85 mL/min (ref >60.00)
Glucose, Bld: 97 mg/dL (ref 70–99)
Potassium: 4 meq/L (ref 3.5–5.1)
Sodium: 143 meq/L (ref 135–145)

## 2024-06-06 LAB — TSH: TSH: 1 u[IU]/mL (ref 0.35–5.50)

## 2024-06-06 LAB — VITAMIN D 25 HYDROXY (VIT D DEFICIENCY, FRACTURES): VITD: 23.61 ng/mL — ABNORMAL LOW (ref 30.00–100.00)

## 2024-06-06 MED ORDER — ROSUVASTATIN CALCIUM 10 MG PO TABS
10.0000 mg | ORAL_TABLET | Freq: Every day | ORAL | 3 refills | Status: DC
  Filled 2024-06-06: qty 30, 30d supply, fill #0

## 2024-06-06 MED ORDER — FLUTICASONE PROPIONATE 50 MCG/ACT NA SUSP
2.0000 | Freq: Every day | NASAL | 1 refills | Status: AC
  Filled 2024-06-06: qty 16, 30d supply, fill #0
  Filled 2024-06-26: qty 16, 28d supply, fill #0

## 2024-06-06 MED ORDER — MONTELUKAST SODIUM 10 MG PO TABS
10.0000 mg | ORAL_TABLET | Freq: Every day | ORAL | 3 refills | Status: AC
  Filled 2024-06-06: qty 30, 30d supply, fill #0

## 2024-06-06 MED ORDER — LORATADINE 10 MG PO TABS
10.0000 mg | ORAL_TABLET | Freq: Every day | ORAL | 3 refills | Status: AC | PRN
  Filled 2024-06-06: qty 30, 30d supply, fill #0
  Filled 2024-06-26: qty 90, 90d supply, fill #0

## 2024-06-06 MED ORDER — AMLODIPINE BESYLATE 2.5 MG PO TABS
2.5000 mg | ORAL_TABLET | Freq: Every day | ORAL | 3 refills | Status: AC
  Filled 2024-06-06: qty 30, 30d supply, fill #0

## 2024-06-06 NOTE — Progress Notes (Signed)
 Leron Glance, NP-C Phone: [phone]  Teresa Deleon is a 58 y.o. female who presents today for transfer of care.   Discussed the use of AI scribe software for clinical note transcription with the patient, who gave verbal consent to proceed.  History of Present Illness   Teresa Deleon is a 58 year old female with a herniated disc and COPD who presents for transfer of care and management of her medical conditions.  She has a history of a herniated disc and sciatic nerve pain. The pain is bilateral, affecting both legs. She has not seen a specialist for her back and manages symptoms with baclofen  for muscle spasms. An MRI previously confirmed the herniated disc. She uses a wagon to reduce stress on her back and occasionally rides buggies for mobility assistance.  She has COPD and uses Breztri  twice daily, albuterol  as needed, and nebulizer treatments with DuoNebs. She experiences wheezing and a productive cough, which worsened after a recent cold, leading to sore ribs from coughing and an increased nebulizer dose. She has reduced smoking from a pack a day to a third of a pack a day. She also uses a nasal spray and Singulair  for allergies, although she has run out of Singulair  recently. She experiences shortness of breath and avoids perfumes due to allergies.  She is on metformin  500 mg once daily for blood sugar management, amlodipine  2.5 mg for blood pressure, and omeprazole . She has not been checking her blood sugar and reports dry mouth but no excessive thirst or urination. She was previously on atorvastatin  for cholesterol but stopped due to a recall and has not been on any cholesterol medication for two weeks. She takes vitamin D  weekly.  She has a history of keloid formation following surgeries, including breast surgery for an abscess and hernia surgery. She reports pain and itching at the keloid sites. She also has hanging moles that are irritating.      Social History   Tobacco Use   Smoking Status Some Days   Current packs/day: 1.50   Average packs/day: 1.5 packs/day for 43.9 years (65.8 ttl pk-yrs)   Types: Cigarettes   Start date: 1982  Smokeless Tobacco Never  Tobacco Comments   1 ppd     No current facility-administered medications on file prior to visit.   Current Outpatient Medications on File Prior to Visit  Medication Sig Dispense Refill   albuterol  (PROVENTIL ) (2.5 MG/3ML) 0.083% nebulizer solution Take 3 mLs (2.5 mg total) by nebulization every 6 (six) hours as needed for wheezing or shortness of breath. 75 mL 12   albuterol  (VENTOLIN  HFA) 108 (90 Base) MCG/ACT inhaler Inhale 2 puffs into the lungs every 6 (six) hours as needed. 6.7 g 0   budesonide -glycopyrrolate -formoterol  (BREZTRI  AEROSPHERE) 160-9-4.8 MCG/ACT AERO inhaler Inhale 2 puffs into the lungs in the morning and at bedtime. 10.7 g 11   ipratropium (ATROVENT ) 0.06 % nasal spray Place 2 sprays into both nostrils 4 (four) times daily. 15 mL 12   metFORMIN  (GLUCOPHAGE -XR) 500 MG 24 hr tablet Take 1 tablet (500 mg total) by mouth daily with breakfast. 90 tablet 1   omeprazole  (PRILOSEC) 40 MG capsule Take 1 capsule (40 mg total) by mouth daily. 30 min before food 90 capsule 3   traZODone  (DESYREL ) 50 MG tablet Take 0.5 tablets (25 mg total) by mouth at bedtime as needed for sleep. 30 tablet 0   Vitamin D , Ergocalciferol , (DRISDOL ) 1.25 MG (50000 UNIT) CAPS capsule Take 1 capsule (50,000 Units  total) by mouth every 7 (seven) days. 12 capsule 3   [DISCONTINUED] pantoprazole  (PROTONIX ) 40 MG tablet Take 1 tablet (40 mg total) by mouth daily. 30 min before 90 tablet 3     ROS see history of present illness  Objective  Physical Exam Vitals:   06/06/24 0819  BP: 122/82  Pulse: 67  Temp: 98 F (36.7 C)  SpO2: 98%    BP Readings from Last 3 Encounters:  06/16/24 (!) 164/89  06/06/24 122/82  05/14/24 (!) 156/75   Wt Readings from Last 3 Encounters:  06/16/24 156 lb 3.2 oz (70.9 kg)   06/06/24 154 lb 9.6 oz (70.1 kg)  05/14/24 156 lb (70.8 kg)    Physical Exam Constitutional:      General: She is not in acute distress.    Appearance: Normal appearance.  HENT:     Head: Normocephalic.  Cardiovascular:     Rate and Rhythm: Normal rate and regular rhythm.     Heart sounds: Normal heart sounds.  Pulmonary:     Effort: Pulmonary effort is normal.     Breath sounds: Normal breath sounds.  Skin:    General: Skin is warm and dry.  Neurological:     General: No focal deficit present.     Mental Status: She is alert.  Psychiatric:        Mood and Affect: Mood normal.        Behavior: Behavior normal.      Assessment/Plan: Please see individual problem list.  Chronic obstructive pulmonary disease, unspecified COPD type (HCC) Assessment & Plan: COPD recently exacerbated, likely by a cold, with symptoms of productive cough, wheezing, and shortness of breath. Smoking has reduced from a pack a day to a third of a pack a day. Discussed hypnosis for smoking cessation, but she prefers not to pursue it due to concerns about temporary effects. Refilled Singulair  prescription, continue Breztri  and nebulizer treatments, and recommended Mucinex  for mucus management. Encouraged continued smoking cessation efforts.   Type 2 diabetes mellitus with complications [facility]) Assessment & Plan: Managed with metformin , without regular blood sugar monitoring. No symptoms of hyperglycemia or hypoglycemia reported. Patient interested in stopping medication. Discussed potential discontinuation of metformin  based on A1c results and lifestyle factors. Ordered A1c test to assess current glycemic control and will consider a 90-day trial off metformin  if A1c is within target range and lifestyle factors are favorable. Encourage healthy diet and regular exercise. We will continue to monitor.   Orders: -     Hemoglobin A1c  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 Assessment &  Plan: Chronic low back pain with bilateral sciatica is due to a herniated disc, worsened by prolonged sitting and relieved by rest and movement. She has not consulted a specialist or undergone physical therapy previously. Referred to Physical Med for further evaluation and management and encouraged to start physical therapy.  Orders: -     Ambulatory referral to Physical Medicine Rehab  Lumbar herniated disc -     Ambulatory referral to Physical Medicine Rehab  Hypertension associated with diabetes [facility]) Assessment & Plan: Well-controlled with amlodipine , with a blood pressure reading of 122/82 today. No symptoms of chest pain, dizziness, or swelling reported. Continue amlodipine  2.5 mg daily. Check BMP.   Orders: -     amLODIPine  Besylate; Take 1 tablet (2.5 mg total) by mouth daily.  Dispense: 90 tablet; Refill: 3 -     Basic metabolic panel with GFR  Hyperlipidemia associated with type  2 diabetes mellitus (HCC) Assessment & Plan: Management complicated by recall of atorvastatin , with no current lipid-lowering therapy. Plan to switch to rosuvastatin  for cholesterol management. Prescribed rosuvastatin  10 mg daily and will recheck cholesterol levels and hepatic function in 6 weeks.   Orders: -     Rosuvastatin  Calcium ; Take 1 tablet (10 mg total) by mouth daily.  Dispense: 90 tablet; Refill: 3 -     Hepatic function panel; Future -     Lipid panel; Future  Allergic rhinitis, unspecified seasonality, unspecified trigger Assessment & Plan: Singulair  was previously used but discontinued due to running out of medication. Symptoms exacerbated by strong smells and environmental allergens. Refilled Singulair  prescription. Continue flonase  and claritin  daily.   Orders: -     Fluticasone  Propionate; Place 2 sprays into both nostrils daily. Prn max 2 sprays  Dispense: 16 g; Refill: 1 -     Loratadine ; Take 1 tablet (10 mg total) by mouth daily as needed for allergies.  Dispense: 90 tablet;  Refill: 3 -     Montelukast  Sodium; Take 1 tablet (10 mg total) by mouth at bedtime.  Dispense: 90 tablet; Refill: 3  Vitamin D  deficiency -     VITAMIN D  25 Hydroxy (Vit-D Deficiency, Fractures)  Thyroid  disorder screen -     TSH    Return in about 6 weeks (around 07/18/2024) for fasting labs then in 3 months for follow up.   Leron Glance, NP-C [facility]

## 2024-06-09 LAB — HEMOGLOBIN A1C: Hgb A1c MFr Bld: 6.8 % — ABNORMAL HIGH (ref 4.6–6.5)

## 2024-06-10 NOTE — Telephone Encounter (Signed)
 open in error

## 2024-06-11 ENCOUNTER — Ambulatory Visit: Payer: Self-pay | Admitting: Nurse Practitioner

## 2024-06-16 ENCOUNTER — Other Ambulatory Visit: Payer: Self-pay

## 2024-06-16 ENCOUNTER — Ambulatory Visit
Admission: EM | Admit: 2024-06-16 | Discharge: 2024-06-16 | Disposition: A | Discharge status: Home / Self Care | Attending: Emergency Medicine | Admitting: Emergency Medicine

## 2024-06-16 ENCOUNTER — Encounter: Payer: Self-pay | Admitting: Nurse Practitioner

## 2024-06-16 DIAGNOSIS — G8929 Other chronic pain: Secondary | ICD-10-CM | POA: Diagnosis not present

## 2024-06-16 DIAGNOSIS — M545 Low back pain, unspecified: Secondary | ICD-10-CM

## 2024-06-16 MED ORDER — METHYLPREDNISOLONE 4 MG PO TBPK
ORAL_TABLET | ORAL | 0 refills | Status: DC
  Filled 2024-06-16 – 2024-06-26 (×2): qty 21, 6d supply, fill #0

## 2024-06-16 MED ORDER — BACLOFEN 10 MG PO TABS
10.0000 mg | ORAL_TABLET | Freq: Three times a day (TID) | ORAL | 0 refills | Status: DC
  Filled 2024-06-16 – 2024-06-26 (×2): qty 30, 10d supply, fill #0

## 2024-06-16 MED ORDER — DEXAMETHASONE SOD PHOSPHATE PF 10 MG/ML IJ SOLN
10.0000 mg | Freq: Once | INTRAMUSCULAR | Status: AC
  Administered 2024-06-16: 10 mg via INTRAMUSCULAR

## 2024-06-16 NOTE — Discharge Instructions (Addendum)
 Starting tomorrow morning take the Medrol  Dosepak according to the package instructions.  These are smaller, more frequent doses of steroid throughout the day to help decrease inflammation in your back.  Use the baclofen  10 mg every 8 hours help with muscle spasm.  You may apply moist heat to your back to help improve blood flow and aid in pain relief.    Follow the home physical therapy exercises given your discharge instructions.  If you develop any new or worsening symptoms please return for reevaluation or see your primary care provider.

## 2024-06-16 NOTE — Assessment & Plan Note (Signed)
 Management complicated by recall of atorvastatin , with no current lipid-lowering therapy. Plan to switch to rosuvastatin  for cholesterol management. Prescribed rosuvastatin  10 mg daily and will recheck cholesterol levels and hepatic function in 6 weeks.

## 2024-06-16 NOTE — ED Provider Notes (Signed)
 [facility]    CSN: [id number] Arrival date & time: 06/16/24  1201      History   Chief Complaint Chief Complaint  Patient presents with   Back Pain    HPI Teresa Deleon is a 58 y.o. female.   HPI  58 year old female with past medical history significant for chronic midline low back pain, cardiac murmur, GERD, restless leg syndrome, hyperlipidemia, and migraine headache presents for evaluation of acute on chronic low back pain.  It is on both sides and radiates down both legs.  She denies any recent injuries or falls.  She has been taking her tizanidine  without improvement of symptoms.  Past Medical History:  Diagnosis Date   Asthma    Back pain    Chronic sinusitis    COVID 06/2021   06/2021   GERD (gastroesophageal reflux disease)    Heart murmur    Leaky heart valve    Migraine    Migraine    Nausea and vomiting 03/05/2019   Pre-diabetes    Trichomonas infection     Patient Active Problem List   Diagnosis Date Noted   Chronic obstructive pulmonary disease (HCC) 11/18/2023   Vitamin B 12 deficiency 11/18/2023   Hypertension associated with diabetes (HCC) 11/18/2023   Type 2 diabetes mellitus with complications (HCC) 11/18/2023   Obesity (BMI 30-39.9) 11/12/2022   Mild intermittent asthma 11/12/2022   Elevated blood pressure reading 11/12/2022   Dysphagia 08/19/2020   Bronchitis with asthma, acute 06/12/2020   Abnormal MRI, lumbar spine 04/19/2020   Cervicalgia 04/19/2020   Lumbar radiculopathy 04/19/2020   Vitamin D  deficiency 01/26/2020   Hyperlipidemia associated with type 2 diabetes mellitus (HCC) 01/26/2020   RLS (restless legs syndrome) 01/01/2020   Frontal sinusitis 01/01/2020   Cigarette nicotine  dependence without complication 01/01/2020   Lumbar herniated disc 06/24/2019   Insomnia 06/24/2019   Female pelvic pain 06/24/2019   Diarrhea 03/05/2019   Nausea and vomiting 03/05/2019   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  sciatica 04/04/2018   Chronic sinusitis 04/04/2018   Allergic rhinitis 04/04/2018   Tobacco abuse 04/04/2018   Migraine without status migrainosus, not intractable 04/04/2018   Cardiac murmur 04/04/2018   Gastroesophageal reflux disease 04/04/2018   Fibroids 04/04/2018    Past Surgical History:  Procedure Laterality Date   BREAST SURGERY     left breast abcess   COLONOSCOPY WITH PROPOFOL  N/A 09/17/2020   Procedure: COLONOSCOPY WITH PROPOFOL ;  Surgeon: Therisa Bi, MD;  Location: [facility] ENDOSCOPY;  Service: Gastroenterology;  Laterality: N/A;   ESOPHAGOGASTRODUODENOSCOPY (EGD) WITH PROPOFOL  N/A 09/17/2020   Procedure: ESOPHAGOGASTRODUODENOSCOPY (EGD) WITH PROPOFOL ;  Surgeon: Therisa Bi, MD;  Location: [facility] ENDOSCOPY;  Service: Gastroenterology;  Laterality: N/A;   INSERTION OF MESH N/A 05/12/2022   Procedure: INSERTION OF MESH;  Surgeon: Rodolph Romano, MD;  Location: [facility] ORS;  Service: General;  Laterality: N/A;    OB History   No obstetric history on file.      Home Medications    Prior to Admission medications   Medication Sig Start Date End Date Taking? Authorizing Provider  methylPREDNISolone  (MEDROL  DOSEPAK) 4 MG TBPK tablet Take according to the package insert. 06/16/24  Yes Bernardino Ditch, NP  albuterol  (PROVENTIL ) (2.5 MG/3ML) 0.083% nebulizer solution Take 3 mLs (2.5 mg total) by nebulization every 6 (six) hours as needed for wheezing or shortness of breath. 05/14/24   Bernardino Ditch, NP  albuterol  (VENTOLIN  HFA) 108 (90 Base) MCG/ACT inhaler Inhale 2 puffs into the  lungs every 6 (six) hours as needed. 05/21/24   Marylynn Verneita CROME, MD  amLODipine  (NORVASC ) 2.5 MG tablet Take 1 tablet (2.5 mg total) by mouth daily. 06/06/24   Gretel App, NP  baclofen  (LIORESAL ) 10 MG tablet Take 1 tablet (10 mg total) by mouth 3 (three) times daily. 06/16/24   Bernardino Ditch, NP  budesonide -glycopyrrolate -formoterol  (BREZTRI  AEROSPHERE) 160-9-4.8 MCG/ACT AERO inhaler Inhale 2 puffs into  the lungs in the morning and at bedtime. 11/07/23   Hope Merle, MD  fluticasone  (FLONASE ) 50 MCG/ACT nasal spray Place 2 sprays into both nostrils daily. Prn max 2 sprays 06/06/24   Lester, Kacy, NP  ipratropium (ATROVENT ) 0.06 % nasal spray Place 2 sprays into both nostrils 4 (four) times daily. 05/14/24   Bernardino Ditch, NP  loratadine  (CLARITIN ) 10 MG tablet Take 1 tablet (10 mg total) by mouth daily as needed for allergies. 06/06/24   Gretel App, NP  metFORMIN  (GLUCOPHAGE -XR) 500 MG 24 hr tablet Take 1 tablet (500 mg total) by mouth daily with breakfast. 11/23/23   Hope Merle, MD  montelukast  (SINGULAIR ) 10 MG tablet Take 1 tablet (10 mg total) by mouth at bedtime. 06/06/24   Gretel App, NP  omeprazole  (PRILOSEC) 40 MG capsule Take 1 capsule (40 mg total) by mouth daily. 30 min before food 11/07/23   Hope Merle, MD  rosuvastatin  (CRESTOR ) 10 MG tablet Take 1 tablet (10 mg total) by mouth daily. 06/06/24   Gretel App, NP  traZODone  (DESYREL ) 50 MG tablet Take 0.5 tablets (25 mg total) by mouth at bedtime as needed for sleep. 11/07/23   Hope Merle, MD  Vitamin D , Ergocalciferol , (DRISDOL ) 1.25 MG (50000 UNIT) CAPS capsule Take 1 capsule (50,000 Units total) by mouth every 7 (seven) days. 11/18/23   Hope Merle, MD  pantoprazole  (PROTONIX ) 40 MG tablet Take 1 tablet (40 mg total) by mouth daily. 30 min before 04/09/20 08/30/20  McLean-Scocuzza, Randine SAILOR, MD    Family History Family History  Problem Relation Age of Onset   Asthma Mother    COPD Mother    Diabetes Mother    Miscarriages / Stillbirths Mother    Diabetes Father    Heart disease Father    Hypertension Father    Heart failure Father    Heart attack Father    Asthma Daughter    Miscarriages / Stillbirths Daughter    Arthritis Maternal Grandmother    Asthma Maternal Grandmother    Hyperlipidemia Maternal Grandmother    Diabetes Maternal Grandfather    Stroke Paternal Grandmother    Heart disease Brother     Social  History Social History   Tobacco Use   Smoking status: Some Days    Current packs/day: 1.50    Average packs/day: 1.5 packs/day for 43.9 years (65.8 ttl pk-yrs)    Types: Cigarettes    Start date: 1982   Smokeless tobacco: Never   Tobacco comments:    1 ppd   Vaping Use   Vaping status: Never Used  Substance Use Topics   Alcohol use: Yes    Comment: occassionally   Drug use: No     Allergies   Shellfish allergy, Strawberry (diagnostic), Tomato, and Prednisone    Review of Systems Review of Systems  Musculoskeletal:  Positive for back pain.  Neurological:  Negative for weakness and numbness.     Physical Exam Triage Vital Signs ED Triage Vitals [06/16/24 1225]  Encounter Vitals Group     BP      Girls Systolic  BP Percentile      Girls Diastolic BP Percentile      Boys Systolic BP Percentile      Boys Diastolic BP Percentile      Pulse      Resp      Temp      Temp src      SpO2      Weight 156 lb 3.2 oz (70.9 kg)     Height      Head Circumference      Peak Flow      Pain Score      Pain Loc      Pain Education      Exclude from Growth Chart    No data found.  Updated Vital Signs BP (!) 164/89 (BP Location: Right Arm)   Pulse 77   Temp 98 F (36.7 C) (Oral)   Resp 16   Wt 156 lb 3.2 oz (70.9 kg)   SpO2 94%   BMI 30.51 kg/m   Visual Acuity Right Eye Distance:   Left Eye Distance:   Bilateral Distance:    Right Eye Near:   Left Eye Near:    Bilateral Near:     Physical Exam Vitals and nursing note reviewed.  Constitutional:      Appearance: Normal appearance. She is not ill-appearing.  HENT:     Head: Normocephalic and atraumatic.  Musculoskeletal:        General: Tenderness present. No signs of injury.  Skin:    General: Skin is warm and dry.     Capillary Refill: Capillary refill takes less than 2 seconds.     Findings: No bruising or erythema.  Neurological:     General: No focal deficit present.     Mental Status: She is alert  and oriented to person, place, and time.      UC Treatments / Results  Labs (all labs ordered are listed, but only abnormal results are displayed) Labs Reviewed - No data to display  EKG   Radiology No results found.  Procedures Procedures (including critical care time)  Medications Ordered in UC Medications  dexamethasone  (DECADRON ) injection 10 mg (has no administration in time range)    Initial Impression / Assessment and Plan / UC Course  I have reviewed the triage vital signs and the nursing notes.  Pertinent labs & imaging results that were available during my care of the patient were reviewed by me and considered in my medical decision making (see chart for details).   Patient is a pleasant, nontoxic-appearing 58 year old female presenting for evaluation of acute on chronic low back pain.  She reports that she has been experiencing symptoms for the last 2 weeks and she cannot remember any precipitating events.  For last 3 days she has been unable to work secondary to the discomfort.  She has been using tizanidine  and diclofenac  without improvement of symptoms.  I saw her for similar presentation in March and we treated her with 10 mg of IM Decadron  and home steroid pack and baclofen .  She reports that that worked for her though the prednisone  did give her headaches so we will try methylprednisolone  instead of a full prednisone  taper.  Return precautions reviewed.  Work note provided.   Final Clinical Impressions(s) / UC Diagnoses   Final diagnoses:  Acute on chronic low back pain     Discharge Instructions      Starting tomorrow morning take the Medrol  Dosepak according to the package instructions.  These are smaller, more frequent doses of steroid throughout the day to help decrease inflammation in your back.  Use the baclofen  10 mg every 8 hours help with muscle spasm.  You may apply moist heat to your back to help improve blood flow and aid in pain relief.     Follow the home physical therapy exercises given your discharge instructions.  If you develop any new or worsening symptoms please return for reevaluation or see your primary care provider.     ED Prescriptions     Medication Sig Dispense Auth. Provider   baclofen  (LIORESAL ) 10 MG tablet Take 1 tablet (10 mg total) by mouth 3 (three) times daily. 30 each Bernardino Ditch, NP   methylPREDNISolone  (MEDROL  DOSEPAK) 4 MG TBPK tablet Take according to the package insert. 21 tablet Bernardino Ditch, NP      PDMP not reviewed this encounter.   Bernardino Ditch, NP 06/16/24 [phone]

## 2024-06-16 NOTE — ED Triage Notes (Signed)
 Pt c/o back pain x2 wks. Hx of chronic back pain. Denies any recent falls or injuries. Has tried tizanidine  w/o relief.

## 2024-06-16 NOTE — Assessment & Plan Note (Signed)
 Managed with metformin , without regular blood sugar monitoring. No symptoms of hyperglycemia or hypoglycemia reported. Patient interested in stopping medication. Discussed potential discontinuation of metformin  based on A1c results and lifestyle factors. Ordered A1c test to assess current glycemic control and will consider a 90-day trial off metformin  if A1c is within target range and lifestyle factors are favorable. Encourage healthy diet and regular exercise. We will continue to monitor.

## 2024-06-16 NOTE — Assessment & Plan Note (Addendum)
 Singulair  was previously used but discontinued due to running out of medication. Symptoms exacerbated by strong smells and environmental allergens. Refilled Singulair  prescription. Continue flonase  and claritin  daily.

## 2024-06-16 NOTE — Assessment & Plan Note (Signed)
 Well-controlled with amlodipine , with a blood pressure reading of 122/82 today. No symptoms of chest pain, dizziness, or swelling reported. Continue amlodipine  2.5 mg daily. Check BMP.

## 2024-06-16 NOTE — Assessment & Plan Note (Signed)
 COPD recently exacerbated, likely by a cold, with symptoms of productive cough, wheezing, and shortness of breath. Smoking has reduced from a pack a day to a third of a pack a day. Discussed hypnosis for smoking cessation, but she prefers not to pursue it due to concerns about temporary effects. Refilled Singulair  prescription, continue Breztri  and nebulizer treatments, and recommended Mucinex  for mucus management. Encouraged continued smoking cessation efforts.

## 2024-06-16 NOTE — Assessment & Plan Note (Addendum)
 Chronic low back pain with bilateral sciatica is due to a herniated disc, worsened by prolonged sitting and relieved by rest and movement. She has not consulted a specialist or undergone physical therapy previously. Referred to [facility] for further evaluation and management and encouraged to start physical therapy.

## 2024-06-24 ENCOUNTER — Telehealth: Payer: Self-pay | Admitting: Nurse Practitioner

## 2024-06-24 NOTE — Telephone Encounter (Signed)
 Pt came in and dropped off FMLA forms to be filled out. The forms is placed in [employer] back mail box.

## 2024-06-26 ENCOUNTER — Other Ambulatory Visit: Payer: Self-pay

## 2024-07-03 NOTE — Telephone Encounter (Signed)
 Patient stated that paperwork have to be sent in by December 15,2025.

## 2024-07-03 NOTE — Telephone Encounter (Signed)
 Patient would like to know if her FMLA paperwork have been completed and faxed back to her employer? She stated that her job said they have not received the forms back yet.

## 2024-07-07 ENCOUNTER — Ambulatory Visit
Admission: EM | Admit: 2024-07-07 | Discharge: 2024-07-07 | Disposition: A | Discharge status: Home / Self Care | Source: Home / Self Care

## 2024-07-07 DIAGNOSIS — B349 Viral infection, unspecified: Secondary | ICD-10-CM | POA: Diagnosis not present

## 2024-07-07 DIAGNOSIS — R197 Diarrhea, unspecified: Secondary | ICD-10-CM

## 2024-07-07 DIAGNOSIS — R051 Acute cough: Secondary | ICD-10-CM

## 2024-07-07 DIAGNOSIS — R112 Nausea with vomiting, unspecified: Secondary | ICD-10-CM

## 2024-07-07 LAB — POCT INFLUENZA A/B
Influenza A, POC: NEGATIVE
Influenza B, POC: NEGATIVE

## 2024-07-07 LAB — POC SOFIA SARS ANTIGEN FIA: SARS Coronavirus 2 Ag: NEGATIVE

## 2024-07-07 NOTE — ED Provider Notes (Signed)
 [facility]    CSN: [id number] Arrival date & time: 07/07/24  1005      History   Chief Complaint Chief Complaint  Patient presents with   Headache   Emesis   Diarrhea    HPI Teresa Deleon is a 58 y.o. female with history of asthma, COPD, chronic sinusitis, GERD, migraines, chronic pain and type 2 diabetes.  Today, she is presenting for fatigue, cough, headaches, diarrhea, nausea/vomiting, and congestion since yesterday.  Denies fever, ear pain, sore throat, sinus pain, chest pain, wheezing, shortness of breath, abdominal pain. Patient's grandson is also sick and being seen in the urgent care today.  Patient has been taking over-the-counter meds. No other complaints.   HPI  Past Medical History:  Diagnosis Date   Asthma    Back pain    Chronic sinusitis    COVID 06/2021   06/2021   GERD (gastroesophageal reflux disease)    Heart murmur    Leaky heart valve    Migraine    Migraine    Nausea and vomiting 03/05/2019   Pre-diabetes    Trichomonas infection     Patient Active Problem List   Diagnosis Date Noted   Chronic obstructive pulmonary disease (HCC) 11/18/2023   Vitamin B 12 deficiency 11/18/2023   Hypertension associated with diabetes (HCC) 11/18/2023   Type 2 diabetes mellitus with complications (HCC) 11/18/2023   Obesity (BMI 30-39.9) 11/12/2022   Mild intermittent asthma 11/12/2022   Elevated blood pressure reading 11/12/2022   Dysphagia 08/19/2020   Bronchitis with asthma, acute 06/12/2020   Abnormal MRI, lumbar spine 04/19/2020   Cervicalgia 04/19/2020   Lumbar radiculopathy 04/19/2020   Vitamin D  deficiency 01/26/2020   Hyperlipidemia associated with type 2 diabetes mellitus (HCC) 01/26/2020   RLS (restless legs syndrome) 01/01/2020   Frontal sinusitis 01/01/2020   Cigarette nicotine  dependence without complication 01/01/2020   Lumbar herniated disc 06/24/2019   Insomnia 06/24/2019   Female pelvic pain 06/24/2019   Diarrhea  03/05/2019   Nausea and vomiting 03/05/2019   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   Chronic sinusitis 04/04/2018   Allergic rhinitis 04/04/2018   Tobacco abuse 04/04/2018   Migraine without status migrainosus, not intractable 04/04/2018   Cardiac murmur 04/04/2018   Gastroesophageal reflux disease 04/04/2018   Fibroids 04/04/2018    Past Surgical History:  Procedure Laterality Date   BREAST SURGERY     left breast abcess   COLONOSCOPY WITH PROPOFOL  N/A 09/17/2020   Procedure: COLONOSCOPY WITH PROPOFOL ;  Surgeon: Therisa Bi, MD;  Location: [facility] ENDOSCOPY;  Service: Gastroenterology;  Laterality: N/A;   ESOPHAGOGASTRODUODENOSCOPY (EGD) WITH PROPOFOL  N/A 09/17/2020   Procedure: ESOPHAGOGASTRODUODENOSCOPY (EGD) WITH PROPOFOL ;  Surgeon: Therisa Bi, MD;  Location: [facility] ENDOSCOPY;  Service: Gastroenterology;  Laterality: N/A;   INSERTION OF MESH N/A 05/12/2022   Procedure: INSERTION OF MESH;  Surgeon: Rodolph Romano, MD;  Location: [facility] ORS;  Service: General;  Laterality: N/A;    OB History   No obstetric history on file.      Home Medications    Prior to Admission medications  Medication Sig Start Date End Date Taking? Authorizing Provider  amLODipine  (NORVASC ) 2.5 MG tablet Take 1 tablet (2.5 mg total) by mouth daily. 06/06/24  Yes Gretel App, NP  metFORMIN  (GLUCOPHAGE -XR) 500 MG 24 hr tablet Take 1 tablet (500 mg total) by mouth daily with breakfast. 11/23/23  Yes Hope Merle, MD  rosuvastatin  (CRESTOR ) 10 MG tablet Take 1 tablet (10 mg total)  by mouth daily. 06/06/24  Yes Gretel App, NP  albuterol  (PROVENTIL ) (2.5 MG/3ML) 0.083% nebulizer solution Take 3 mLs (2.5 mg total) by nebulization every 6 (six) hours as needed for wheezing or shortness of breath. 05/14/24   Bernardino Ditch, NP  albuterol  (VENTOLIN  HFA) 108 (90 Base) MCG/ACT inhaler Inhale 2 puffs into the lungs every 6 (six) hours as needed. 05/21/24   Marylynn Verneita CROME, MD  baclofen   (LIORESAL ) 10 MG tablet Take 1 tablet (10 mg total) by mouth 3 (three) times daily. 06/16/24   Bernardino Ditch, NP  budesonide -glycopyrrolate -formoterol  (BREZTRI  AEROSPHERE) 160-9-4.8 MCG/ACT AERO inhaler Inhale 2 puffs into the lungs in the morning and at bedtime. 11/07/23   Hope Merle, MD  fluticasone  (FLONASE ) 50 MCG/ACT nasal spray Place 2 sprays into both nostrils daily as needed  max 2 sprays 06/06/24   Lester, Kacy, NP  ipratropium (ATROVENT ) 0.06 % nasal spray Place 2 sprays into both nostrils 4 (four) times daily. 05/14/24   Bernardino Ditch, NP  loratadine  (CLARITIN ) 10 MG tablet Take 1 tablet (10 mg total) by mouth daily as needed for allergies. 06/06/24   Gretel App, NP  methylPREDNISolone  (MEDROL  DOSEPAK) 4 MG TBPK tablet Take according to the package insert. 06/16/24   Bernardino Ditch, NP  montelukast  (SINGULAIR ) 10 MG tablet Take 1 tablet (10 mg total) by mouth at bedtime. 06/06/24   Gretel App, NP  omeprazole  (PRILOSEC) 40 MG capsule Take 1 capsule (40 mg total) by mouth daily. 30 min before food 11/07/23   Hope Merle, MD  traZODone  (DESYREL ) 50 MG tablet Take 0.5 tablets (25 mg total) by mouth at bedtime as needed for sleep. 11/07/23   Hope Merle, MD  Vitamin D , Ergocalciferol , (DRISDOL ) 1.25 MG (50000 UNIT) CAPS capsule Take 1 capsule (50,000 Units total) by mouth every 7 (seven) days. 11/18/23   Hope Merle, MD  pantoprazole  (PROTONIX ) 40 MG tablet Take 1 tablet (40 mg total) by mouth daily. 30 min before 04/09/20 08/30/20  McLean-Scocuzza, Randine SAILOR, MD    Family History Family History  Problem Relation Age of Onset   Asthma Mother    COPD Mother    Diabetes Mother    Miscarriages / Stillbirths Mother    Diabetes Father    Heart disease Father    Hypertension Father    Heart failure Father    Heart attack Father    Asthma Daughter    Miscarriages / Stillbirths Daughter    Arthritis Maternal Grandmother    Asthma Maternal Grandmother    Hyperlipidemia Maternal Grandmother     Diabetes Maternal Grandfather    Stroke Paternal Grandmother    Heart disease Brother     Social History Social History[1]   Allergies   Shellfish allergy, Strawberry (diagnostic), Tomato, and Prednisone    Review of Systems Review of Systems  Constitutional:  Positive for fatigue. Negative for chills, diaphoresis and fever.  HENT:  Positive for congestion and rhinorrhea. Negative for ear pain, sinus pain and sore throat.   Respiratory:  Positive for cough. Negative for shortness of breath.   Cardiovascular:  Negative for chest pain.  Gastrointestinal:  Positive for diarrhea, nausea and vomiting. Negative for abdominal pain.  Musculoskeletal:  Positive for myalgias.  Skin:  Negative for rash.  Neurological:  Positive for headaches. Negative for weakness.  Hematological:  Negative for adenopathy.     Physical Exam Triage Vital Signs ED Triage Vitals  Encounter Vitals Group     BP      Girls Systolic BP  Percentile      Girls Diastolic BP Percentile      Boys Systolic BP Percentile      Boys Diastolic BP Percentile      Pulse      Resp      Temp      Temp src      SpO2      Weight      Height      Head Circumference      Peak Flow      Pain Score      Pain Loc      Pain Education      Exclude from Growth Chart    No data found.  Updated Vital Signs BP (!) 143/78 (BP Location: Right Arm)   Pulse 80   Temp 98.2 F (36.8 C) (Oral)   Resp 17   Wt 156 lb 2.1 oz (70.8 kg)   SpO2 94%   BMI 30.49 kg/m     Physical Exam Vitals and nursing note reviewed.  Constitutional:      General: She is not in acute distress.    Appearance: Normal appearance. She is not ill-appearing or toxic-appearing.  HENT:     Head: Normocephalic and atraumatic.     Nose: Congestion present.     Mouth/Throat:     Mouth: Mucous membranes are moist.     Pharynx: Oropharynx is clear.  Eyes:     General: No scleral icterus.       Right eye: No discharge.        Left eye: No  discharge.     Conjunctiva/sclera: Conjunctivae normal.  Cardiovascular:     Rate and Rhythm: Normal rate and regular rhythm.     Heart sounds: Normal heart sounds.  Pulmonary:     Effort: Pulmonary effort is normal. No respiratory distress.     Breath sounds: Normal breath sounds.  Abdominal:     Palpations: Abdomen is soft.     Tenderness: There is no abdominal tenderness.  Musculoskeletal:     Cervical back: Neck supple.  Skin:    General: Skin is dry.  Neurological:     General: No focal deficit present.     Mental Status: She is alert. Mental status is at baseline.     Motor: No weakness.     Gait: Gait normal.  Psychiatric:        Mood and Affect: Mood normal.        Behavior: Behavior normal.      UC Treatments / Results  Labs (all labs ordered are listed, but only abnormal results are displayed) Labs Reviewed  POC SOFIA SARS ANTIGEN FIA - Normal  POCT INFLUENZA A/B - Normal    EKG   Radiology No results found.  Procedures Procedures (including critical care time)  Medications Ordered in UC Medications - No data to display  Initial Impression / Assessment and Plan / UC Course  I have reviewed the triage vital signs and the nursing notes.  Pertinent labs & imaging results that were available during my care of the patient were reviewed by me and considered in my medical decision making (see chart for details).   58 year old female with history of asthma and COPD presents for onset of fatigue, cough, congestion, headaches and bodyaches since yesterday.  She denies any increase in shortness of breath from baseline.  Her grandson is also sick.  COVID and flu testing obtained. All negative.   Reviewed results with patient.  Viral illness. Supportive care advised.  Offered to send prescription cough medication, Zofran  and give a work note but patient declines.  Will continue home meds.  Advised to return if fever, worsening cough, chest pain, shortness of  breath, weakness, worsening abdominal pain.   Final Clinical Impressions(s) / UC Diagnoses   Final diagnoses:  Acute cough  Viral illness  Nausea vomiting and diarrhea     Discharge Instructions      URI/COLD SYMPTOMS: Your exam today is consistent with a viral illness. Antibiotics are not indicated at this time. Use medications as directed, including cough syrup, nasal saline, and decongestants. Your symptoms should improve over the next few days and resolve within 7-10 days. Increase rest and fluids. F/u if symptoms worsen or predominate such as sore throat, ear pain, productive cough, shortness of breath, or if you develop high fevers or worsening fatigue over the next several days.       ED Prescriptions   None    PDMP not reviewed this encounter.     [1]  Social History Tobacco Use   Smoking status: Some Days    Current packs/day: 1.50    Average packs/day: 1.5 packs/day for 44.0 years (65.9 ttl pk-yrs)    Types: Cigarettes    Start date: 62   Smokeless tobacco: Never   Tobacco comments:    1 ppd   Vaping Use   Vaping status: Never Used  Substance Use Topics   Alcohol use: Yes    Comment: occassionally   Drug use: No     Arvis Jolan NOVAK, PA-C 07/07/24 1104

## 2024-07-07 NOTE — ED Triage Notes (Signed)
 Sx x 1 day  Headache Bodyaches Diarrhea Emesis Nausea  Fatigue

## 2024-07-07 NOTE — Discharge Instructions (Addendum)

## 2024-07-08 ENCOUNTER — Other Ambulatory Visit: Payer: Self-pay

## 2024-07-08 MED ORDER — METHYLPREDNISOLONE 4 MG PO TBPK
ORAL_TABLET | ORAL | 0 refills | Status: AC
  Filled 2024-07-08: qty 21, 6d supply, fill #0

## 2024-07-08 MED ORDER — BACLOFEN 10 MG PO TABS
10.0000 mg | ORAL_TABLET | Freq: Every day | ORAL | 1 refills | Status: AC
  Filled 2024-07-08: qty 30, 30d supply, fill #0

## 2024-07-08 NOTE — Telephone Encounter (Signed)
 Detailed vm left informing pt that her FMLA paper work has been faxed and her copy is ready for pick up   E2C2 IT IS OKAY TO RELAY THAT INFO TO PT , FMLA FORMS HAVE BEEN PLACED IN DESIGNATED PICK UP AREA NEAR FRONT DESK

## 2024-07-08 NOTE — Telephone Encounter (Unsigned)
 Copied from CRM #[id number]. Topic: General - Other >> Jul 08, 2024  1:04 PM Teresa Deleon wrote: Reason for CRM: Patient is calling to follow up on FML paper work that was due to be submitted yesterday 07/07/2024.

## 2024-07-18 ENCOUNTER — Other Ambulatory Visit: Payer: Self-pay

## 2024-07-21 ENCOUNTER — Other Ambulatory Visit (INDEPENDENT_AMBULATORY_CARE_PROVIDER_SITE_OTHER)

## 2024-07-21 ENCOUNTER — Other Ambulatory Visit: Payer: Self-pay

## 2024-07-21 ENCOUNTER — Other Ambulatory Visit: Payer: Self-pay | Admitting: Nurse Practitioner

## 2024-07-21 DIAGNOSIS — Z87891 Personal history of nicotine dependence: Secondary | ICD-10-CM

## 2024-07-21 DIAGNOSIS — E1169 Type 2 diabetes mellitus with other specified complication: Secondary | ICD-10-CM

## 2024-07-21 DIAGNOSIS — F17210 Nicotine dependence, cigarettes, uncomplicated: Secondary | ICD-10-CM

## 2024-07-21 DIAGNOSIS — Z122 Encounter for screening for malignant neoplasm of respiratory organs: Secondary | ICD-10-CM

## 2024-07-21 DIAGNOSIS — E785 Hyperlipidemia, unspecified: Secondary | ICD-10-CM

## 2024-07-21 LAB — HEPATIC FUNCTION PANEL
ALT: 30 U/L (ref 3–35)
AST: 18 U/L (ref 5–37)
Albumin: 4.6 g/dL (ref 3.5–5.2)
Alkaline Phosphatase: 93 U/L (ref 39–117)
Bilirubin, Direct: 0.1 mg/dL (ref 0.1–0.3)
Total Bilirubin: 0.6 mg/dL (ref 0.2–1.2)
Total Protein: 7 g/dL (ref 6.0–8.3)

## 2024-07-21 LAB — LIPID PANEL
Cholesterol: 177 mg/dL (ref 28–200)
HDL: 38.1 mg/dL — ABNORMAL LOW
LDL Cholesterol: 116 mg/dL — ABNORMAL HIGH (ref 10–99)
NonHDL: 138.99
Total CHOL/HDL Ratio: 5
Triglycerides: 115 mg/dL (ref 10.0–149.0)
VLDL: 23 mg/dL (ref 0.0–40.0)

## 2024-07-21 MED ORDER — ROSUVASTATIN CALCIUM 20 MG PO TABS
20.0000 mg | ORAL_TABLET | Freq: Every day | ORAL | 3 refills | Status: AC
  Filled 2024-07-21: qty 30, 30d supply, fill #0

## 2024-08-01 ENCOUNTER — Other Ambulatory Visit: Payer: Self-pay

## 2024-08-28 ENCOUNTER — Other Ambulatory Visit: Payer: Self-pay

## 2024-08-28 ENCOUNTER — Ambulatory Visit
Admission: EM | Admit: 2024-08-28 | Discharge: 2024-08-28 | Disposition: A | Discharge status: Home / Self Care | Source: Home / Self Care

## 2024-08-28 ENCOUNTER — Emergency Department

## 2024-08-28 ENCOUNTER — Emergency Department
Admission: EM | Admit: 2024-08-28 | Discharge: 2024-08-28 | Disposition: A | Discharge status: Home / Self Care | Attending: Emergency Medicine | Admitting: Emergency Medicine

## 2024-08-28 DIAGNOSIS — R0602 Shortness of breath: Secondary | ICD-10-CM

## 2024-08-28 DIAGNOSIS — I10 Essential (primary) hypertension: Secondary | ICD-10-CM | POA: Diagnosis not present

## 2024-08-28 DIAGNOSIS — J441 Chronic obstructive pulmonary disease with (acute) exacerbation: Secondary | ICD-10-CM | POA: Insufficient documentation

## 2024-08-28 DIAGNOSIS — R079 Chest pain, unspecified: Secondary | ICD-10-CM | POA: Diagnosis not present

## 2024-08-28 HISTORY — DX: Chronic obstructive pulmonary disease, unspecified: J44.9

## 2024-08-28 LAB — RESP PANEL BY RT-PCR (RSV, FLU A&B, COVID)  RVPGX2
Influenza A by PCR: NEGATIVE
Influenza B by PCR: NEGATIVE
Resp Syncytial Virus by PCR: NEGATIVE
SARS Coronavirus 2 by RT PCR: NEGATIVE

## 2024-08-28 LAB — CBC
HCT: 42.1 % (ref 36.0–46.0)
Hemoglobin: 14.2 g/dL (ref 12.0–15.0)
MCH: 30.3 pg (ref 26.0–34.0)
MCHC: 33.7 g/dL (ref 30.0–36.0)
MCV: 90 fL (ref 80.0–100.0)
Platelets: 323 10*3/uL (ref 150–400)
RBC: 4.68 MIL/uL (ref 3.87–5.11)
RDW: 13.9 % (ref 11.5–15.5)
WBC: 8 10*3/uL (ref 4.0–10.5)
nRBC: 0 % (ref 0.0–0.2)

## 2024-08-28 LAB — BASIC METABOLIC PANEL WITH GFR
Anion gap: 13 (ref 5–15)
BUN: 8 mg/dL (ref 6–20)
CO2: 23 mmol/L (ref 22–32)
Calcium: 9.4 mg/dL (ref 8.9–10.3)
Chloride: 104 mmol/L (ref 98–111)
Creatinine, Ser: 0.75 mg/dL (ref 0.44–1.00)
GFR, Estimated: >60 mL/min
Glucose, Bld: 83 mg/dL (ref 70–99)
Potassium: 4.1 mmol/L (ref 3.5–5.1)
Sodium: 140 mmol/L (ref 135–145)

## 2024-08-28 LAB — TROPONIN T, HIGH SENSITIVITY
Troponin T High Sensitivity: <6 ng/L (ref 0–19)
Troponin T High Sensitivity: <6 ng/L (ref 0–19)

## 2024-08-28 MED ORDER — DEXAMETHASONE SOD PHOSPHATE PF 10 MG/ML IJ SOLN
8.0000 mg | Freq: Once | INTRAMUSCULAR | Status: AC
  Administered 2024-08-28: 8 mg via INTRAMUSCULAR
  Filled 2024-08-28: qty 1

## 2024-08-28 MED ORDER — IPRATROPIUM-ALBUTEROL 0.5-2.5 (3) MG/3ML IN SOLN
3.0000 mL | Freq: Once | RESPIRATORY_TRACT | Status: AC
  Administered 2024-08-28: 3 mL via RESPIRATORY_TRACT
  Filled 2024-08-28: qty 3

## 2024-08-28 NOTE — ED Notes (Signed)
 See triage note  Presents with some SOB and chest discomfort  States this started about 1 week ago Afebrile on arrival

## 2024-08-28 NOTE — Discharge Instructions (Signed)
-   As discussed, there are abnormalities on your EKG from previous EKGs.  I am concerned that could be a blockage so you need to have labs performed immediately.  We discussed going immediately to the ER.  You state that you will have your mother take you to the ER and do not want EMS. - Go immediately to the ER and do not delay.  You have been advised to follow up immediately in the emergency department for concerning signs.symptoms. If you declined EMS transport, please have a family member take you directly to the ED at this time. Do not delay. Based on concerns about condition, if you do not follow up in th e ED, you may risk poor outcomes including worsening of condition, delayed treatment and potentially life threatening issues. If you have declined to go to the ED at this time, you should call your PCP immediately to set up a follow up appointment.  Go to ED for red flag symptoms, including; fevers you cannot reduce with Tylenol /Motrin , severe headaches, vision changes, numbness/weakness in part of the body, lethargy, confusion, intractable vomiting, severe dehydration, chest pain, breathing difficulty, severe persistent abdominal or pelvic pain, signs of severe infection (increased redness, swelling of an area), feeling faint or passing out, dizziness, etc. You should especially go to the ED for sudden acute worsening of condition if you do not elect to go at this time.

## 2024-08-28 NOTE — ED Notes (Signed)
 Patient is being discharged from the Urgent Care and sent to the Emergency Department via POV . Per Lyle Host, PA, patient is in need of higher level of care due to chest pain. Patient is aware and verbalizes understanding of plan of care.  Vitals:   08/28/24 1211  BP: (!) 163/89  Pulse: 71  Resp: (!) 22  Temp: 97.8 F (36.6 C)  SpO2: 100%

## 2024-08-28 NOTE — ED Triage Notes (Signed)
 Patient to Urgent Care with complaints of  left sided, sharp and stabbing chest pain. Reports her body is tensing d/t the pain.   Symptoms x1 week. Consistent today. Productive cough.

## 2024-08-28 NOTE — ED Provider Notes (Signed)
 "  [facility] Provider Note    Event Date/Time   First MD Initiated Contact with Patient 08/28/24 1544     (approximate)   History   Chest Pain   HPI  Teresa Deleon is a 59 y.o. female with a history of COPD who presents with chest tightness, mild shortness of breath.  She denies fevers or chills.  No nausea or vomiting.     Physical Exam   Triage Vital Signs: ED Triage Vitals  Encounter Vitals Group     BP 08/28/24 1301 (!) 167/87     Girls Systolic BP Percentile --      Girls Diastolic BP Percentile --      Boys Systolic BP Percentile --      Boys Diastolic BP Percentile --      Pulse Rate 08/28/24 1301 70     Resp 08/28/24 1301 (!) 22     Temp 08/28/24 1301 98.1 F (36.7 C)     Temp Source 08/28/24 1625 Oral     SpO2 08/28/24 1301 96 %     Weight 08/28/24 1656 72.5 kg (159 lb 13.3 oz)     Height 08/28/24 1257 1.524 m (5')     Head Circumference --      Peak Flow --      Pain Score 08/28/24 1256 6     Pain Loc --      Pain Education --      Exclude from Growth Chart --     Most recent vital signs: Vitals:   08/28/24 1301 08/28/24 1625  BP: (!) 167/87 (!) 159/69  Pulse: 70 66  Resp: (!) 22 18  Temp: 98.1 F (36.7 C) 97.8 F (36.6 C)  SpO2: 96% 94%     General: Awake, no distress.  Overall well-appearing CV:  Good peripheral perfusion.  Resp:  Normal effort.  Scattered wheezing Abd:  No distention.  Other:  No calf pain or swelling   ED Results / Procedures / Treatments   Labs (all labs ordered are listed, but only abnormal results are displayed) Labs Reviewed  RESP PANEL BY RT-PCR (RSV, FLU A&B, COVID)  [id number]  BASIC METABOLIC PANEL WITH GFR  CBC  TROPONIN T, HIGH SENSITIVITY  TROPONIN T, HIGH SENSITIVITY     EKG ED ECG REPORT I, Lamar Price, the attending physician, personally viewed and interpreted this ECG.  Date: 08/28/2024  Rhythm: normal sinus rhythm QRS Axis: normal Intervals: normal ST/T Wave  abnormalities: normal Narrative Interpretation: no evidence of acute ischemia     RADIOLOGY Chest x-ray view interpret by me, no acute abnormality    PROCEDURES:  Critical Care performed:   Procedures   MEDICATIONS ORDERED IN ED: Medications  ipratropium-albuterol  (DUONEB) 0.5-2.5 (3) MG/3ML nebulizer solution 3 mL (3 mLs Nebulization Given 08/28/24 1647)  ipratropium-albuterol  (DUONEB) 0.5-2.5 (3) MG/3ML nebulizer solution 3 mL (3 mLs Nebulization Given 08/28/24 1647)  dexamethasone  (DECADRON ) injection 8 mg (8 mg Intramuscular Given 08/28/24 1648)     IMPRESSION / MDM / ASSESSMENT AND PLAN / ED COURSE  I reviewed the triage vital signs and the nursing notes. Patient's presentation is most consistent with exacerbation of chronic illness.  Patient presents with shortness of breath, chest tightness as above, she is wheezing on exam most consistent with COPD exacerbation.  Treated with Decadron  IM, DuoNebs.  Lab work reassuring, chest x-ray without pneumonia or pneumothorax  On reeval patient feeling much improved, she is asking for discharge which I think  is appropriate, no indication for admission at this time, return precautions discussed.         FINAL CLINICAL IMPRESSION(S) / ED DIAGNOSES   Final diagnoses:  COPD with acute exacerbation (HCC)     Rx / DC Orders   ED Discharge Orders     None        Note:  This document was prepared using Dragon voice recognition software and may include unintentional dictation errors.   Arlander Charleston, MD 08/28/24 2259  "

## 2024-08-28 NOTE — ED Triage Notes (Signed)
 Pt to ED from UC for chest pain x1 week, worsening last night. DOE noted, reports COPD, but this [facility] feels worse than normal. Known sick contacts with flu.

## 2024-08-28 NOTE — ED Provider Notes (Signed)
 " [facility]    CSN: [id number] Arrival date & time: 08/28/24  1200      History   Chief Complaint Chief Complaint  Patient presents with   Chest Pain    HPI Teresa Deleon is a 59 y.o. female presenting for intermittent sharp stabbing left-sided chest pains that began last night, lasted several hours before letting up and then returning again and lasting several hours this morning.  The last time she felt any chest pain was about 6 hours ago.  She says the pain did not radiate but she does admit to occasionally having pain in the left side of her jaw.  She does report shortness of breath which is increased from baseline but also reports a history of asthma and COPD.  She denies dizziness, fatigue, fever, palpitations, leg swelling.  Patient does report cough productive of brownish sputum for 2-1/2 weeks.  Her other medical history is significant for type 2 diabetes, obesity, hypertension, hyperlipidemia, leaky heart valve, heart murmur, migraines, tobacco abuse.  HPI  Past Medical History:  Diagnosis Date   Asthma    Back pain    Chronic sinusitis    COPD (chronic obstructive pulmonary disease) (HCC)    COVID 06/2021   06/2021   GERD (gastroesophageal reflux disease)    Heart murmur    Leaky heart valve    Migraine    Migraine    Nausea and vomiting 03/05/2019   Pre-diabetes    Trichomonas infection     Patient Active Problem List   Diagnosis Date Noted   Chronic obstructive pulmonary disease (HCC) 11/18/2023   Vitamin B 12 deficiency 11/18/2023   Hypertension associated with diabetes (HCC) 11/18/2023   Type 2 diabetes mellitus with complications (HCC) 11/18/2023   Obesity (BMI 30-39.9) 11/12/2022   Mild intermittent asthma 11/12/2022   Elevated blood pressure reading 11/12/2022   Dysphagia 08/19/2020   Bronchitis with asthma, acute 06/12/2020   Abnormal MRI, lumbar spine 04/19/2020   Cervicalgia 04/19/2020   Lumbar radiculopathy 04/19/2020   Vitamin  D deficiency 01/26/2020   Hyperlipidemia associated with type 2 diabetes mellitus (HCC) 01/26/2020   RLS (restless legs syndrome) 01/01/2020   Frontal sinusitis 01/01/2020   Cigarette nicotine  dependence without complication 01/01/2020   Lumbar herniated disc 06/24/2019   Insomnia 06/24/2019   Female pelvic pain 06/24/2019   Diarrhea 03/05/2019   Nausea and vomiting 03/05/2019   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   Chronic sinusitis 04/04/2018   Allergic rhinitis 04/04/2018   Tobacco abuse 04/04/2018   Migraine without status migrainosus, not intractable 04/04/2018   Cardiac murmur 04/04/2018   Gastroesophageal reflux disease 04/04/2018   Fibroids 04/04/2018    Past Surgical History:  Procedure Laterality Date   BREAST SURGERY     left breast abcess   COLONOSCOPY WITH PROPOFOL  N/A 09/17/2020   Procedure: COLONOSCOPY WITH PROPOFOL ;  Surgeon: Therisa Bi, MD;  Location: [facility] ENDOSCOPY;  Service: Gastroenterology;  Laterality: N/A;   ESOPHAGOGASTRODUODENOSCOPY (EGD) WITH PROPOFOL  N/A 09/17/2020   Procedure: ESOPHAGOGASTRODUODENOSCOPY (EGD) WITH PROPOFOL ;  Surgeon: Therisa Bi, MD;  Location: [facility] ENDOSCOPY;  Service: Gastroenterology;  Laterality: N/A;   INSERTION OF MESH N/A 05/12/2022   Procedure: INSERTION OF MESH;  Surgeon: Rodolph Romano, MD;  Location: [facility] ORS;  Service: General;  Laterality: N/A;    OB History   No obstetric history on file.      Home Medications    Prior to Admission medications  Medication Sig Start Date  End Date Taking? Authorizing Provider  albuterol  (PROVENTIL ) (2.5 MG/3ML) 0.083% nebulizer solution Take 3 mLs (2.5 mg total) by nebulization every 6 (six) hours as needed for wheezing or shortness of breath. 05/14/24   Bernardino Ditch, NP  albuterol  (VENTOLIN  HFA) 108 (90 Base) MCG/ACT inhaler Inhale 2 puffs into the lungs every 6 (six) hours as needed. 05/21/24   Marylynn Verneita CROME, MD  amLODipine  (NORVASC ) 2.5 MG  tablet Take 1 tablet (2.5 mg total) by mouth daily. 06/06/24   Gretel App, NP  baclofen  (LIORESAL ) 10 MG tablet Take 1 tablet (10 mg total) by mouth at bedtime 07/08/24     budesonide -glycopyrrolate -formoterol  (BREZTRI  AEROSPHERE) 160-9-4.8 MCG/ACT AERO inhaler Inhale 2 puffs into the lungs in the morning and at bedtime. 11/07/23   Hope Merle, MD  fluticasone  (FLONASE ) 50 MCG/ACT nasal spray Place 2 sprays into both nostrils daily as needed  max 2 sprays 06/06/24   Lester, Kacy, NP  ipratropium (ATROVENT ) 0.06 % nasal spray Place 2 sprays into both nostrils 4 (four) times daily. 05/14/24   Bernardino Ditch, NP  loratadine  (CLARITIN ) 10 MG tablet Take 1 tablet (10 mg total) by mouth daily as needed for allergies. 06/06/24   Gretel App, NP  metFORMIN  (GLUCOPHAGE -XR) 500 MG 24 hr tablet Take 1 tablet (500 mg total) by mouth daily with breakfast. 11/23/23   Hope Merle, MD  methylPREDNISolone  (MEDROL  DOSEPAK) 4 MG TBPK tablet Follow package directions. 07/08/24     montelukast  (SINGULAIR ) 10 MG tablet Take 1 tablet (10 mg total) by mouth at bedtime. 06/06/24   Gretel App, NP  omeprazole  (PRILOSEC) 40 MG capsule Take 1 capsule (40 mg total) by mouth daily. 30 min before food 11/07/23   Hope Merle, MD  rosuvastatin  (CRESTOR ) 20 MG tablet Take 1 tablet (20 mg total) by mouth daily. 07/21/24   Gretel App, NP  traZODone  (DESYREL ) 50 MG tablet Take 0.5 tablets (25 mg total) by mouth at bedtime as needed for sleep. 11/07/23   Hope Merle, MD  Vitamin D , Ergocalciferol , (DRISDOL ) 1.25 MG (50000 UNIT) CAPS capsule Take 1 capsule (50,000 Units total) by mouth every 7 (seven) days. 11/18/23   Hope Merle, MD  pantoprazole  (PROTONIX ) 40 MG tablet Take 1 tablet (40 mg total) by mouth daily. 30 min before 04/09/20 08/30/20  McLean-Scocuzza, Randine SAILOR, MD    Family History Family History  Problem Relation Age of Onset   Asthma Mother    COPD Mother    Diabetes Mother    Miscarriages / Stillbirths Mother     Diabetes Father    Heart disease Father    Hypertension Father    Heart failure Father    Heart attack Father    Asthma Daughter    Miscarriages / Stillbirths Daughter    Arthritis Maternal Grandmother    Asthma Maternal Grandmother    Hyperlipidemia Maternal Grandmother    Diabetes Maternal Grandfather    Stroke Paternal Grandmother    Heart disease Brother     Social History Social History[1]   Allergies   Shellfish allergy, Strawberry (diagnostic), Tomato, Chocolate, and Prednisone    Review of Systems Review of Systems  Constitutional:  Negative for fatigue and fever.  HENT:  Positive for congestion.   Respiratory:  Positive for cough, shortness of breath and wheezing.   Cardiovascular:  Positive for chest pain. Negative for palpitations and leg swelling.  Gastrointestinal:  Negative for abdominal pain, nausea and vomiting.  Musculoskeletal:  Negative for back pain.  Neurological:  Negative for dizziness,  weakness, numbness and headaches.     Physical Exam Triage Vital Signs ED Triage Vitals [08/28/24 1208]  Encounter Vitals Group     BP      Girls Systolic BP Percentile      Girls Diastolic BP Percentile      Boys Systolic BP Percentile      Boys Diastolic BP Percentile      Pulse      Resp      Temp      Temp src      SpO2      Weight 160 lb (72.6 kg)     Height      Head Circumference      Peak Flow      Pain Score 9     Pain Loc      Pain Education      Exclude from Growth Chart    No data found.  Updated Vital Signs BP (!) 163/89 (BP Location: Left Arm)   Pulse 71   Temp 97.8 F (36.6 C) (Oral)   Resp (!) 22   Wt 160 lb (72.6 kg)   SpO2 100%   BMI 31.25 kg/m    Physical Exam Vitals and nursing note reviewed.  Constitutional:      General: She is not in acute distress.    Appearance: Normal appearance. She is not ill-appearing or toxic-appearing.  HENT:     Head: Normocephalic and atraumatic.     Nose: Nose normal.      Mouth/Throat:     Mouth: Mucous membranes are moist.     Pharynx: Oropharynx is clear.  Eyes:     General: No scleral icterus.       Right eye: No discharge.        Left eye: No discharge.     Conjunctiva/sclera: Conjunctivae normal.  Cardiovascular:     Rate and Rhythm: Normal rate and regular rhythm.     Heart sounds: Normal heart sounds.  Pulmonary:     Effort: Pulmonary effort is normal. No respiratory distress.     Breath sounds: Wheezing (diffusely throughout) present.  Musculoskeletal:     Cervical back: Neck supple.  Skin:    General: Skin is dry.  Neurological:     General: No focal deficit present.     Mental Status: She is alert. Mental status is at baseline.     Motor: No weakness.     Gait: Gait normal.  Psychiatric:        Mood and Affect: Mood normal.        Behavior: Behavior normal.      UC Treatments / Results  Labs (all labs ordered are listed, but only abnormal results are displayed) Labs Reviewed - No data to display  EKG   Radiology DG Chest 2 View Result Date: 08/28/2024 CLINICAL DATA:  Chest pain for 1 week EXAM: CHEST - 2 VIEW COMPARISON:  May 14, 2024 FINDINGS: The heart size and mediastinal contours are within normal limits. Both lungs are clear. The visualized skeletal structures are unremarkable. IMPRESSION: No active cardiopulmonary disease. Electronically Signed   By: Lynwood Landy Raddle M.D.   On: 08/28/2024 13:37    Procedures ED EKG  Date/Time: 08/28/2024 12:30 PM  Performed by: Arvis Jolan NOVAK, PA-C Authorized by: Arvis Jolan NOVAK, PA-C   Previous ECG:    Previous ECG:  Compared to current   Similarity:  Changes noted   Comparison ECG info:  ST depression in inferior leads  Interpretation:    Interpretation: abnormal   Rate:    ECG rate:  61   ECG rate assessment: normal   Rhythm:    Rhythm: sinus rhythm   Ectopy:    Ectopy: none   QRS:    QRS axis:  Normal   QRS intervals:  Normal   QRS conduction: normal   ST segments:     ST segments:  Depression   Depression:  II, III and aVF T waves:    T waves: non-specific   Comments:     Normal sinus rhythm with regular rate.  Questionable ST depression in inferior leads.  Changes noted from 2022 EKG.  (including critical care time)  Medications Ordered in UC Medications - No data to display  Initial Impression / Assessment and Plan / UC Course  I have reviewed the triage vital signs and the nursing notes.  Pertinent labs & imaging results that were available during my care of the patient were reviewed by me and considered in my medical decision making (see chart for details).   59 year old female with history of COPD/asthma, hypertension, type 2 diabetes, leaky heart valve and heart murmur presents for sharp stabbing severe left-sided chest pain which has been intermittent since last night.  She actually reports feeling the pain a few times last week.  Occasional left-sided jaw pain.  Reports shortness of breath and wheezing but has history of pulmonary problems.  Productive cough of brownish sputum for 2-1/2 weeks.  No fever.  No history of heart attack or stroke.  Blood pressure elevated 163/89.  Other vitals stable.  No acute distress.  On exam, diffuse wheezing throughout all lung fields.  Heart regular rate and rhythm.  EKG shows questionable ST depression in inferior leads.  This compared to EKG from 2022 with notable changes.  Advised patient of concerns for possible underlying ACS.  Advised EMS but she declines.  Explained the need to go to the emergency department to have cardiac enzymes performed.  Patient is agreeable.  States she will have her mother take her to [facility].  She is leaving in stable condition declining any chest pain at this time.  While it is possible patient symptoms could be due to pneumonia or COPD exacerbation, she warrants cardiac workup given risk factors and description of pain.   Final Clinical Impressions(s) / UC Diagnoses    Final diagnoses:  Left-sided chest pain  COPD exacerbation (HCC)  Shortness of breath  Essential hypertension     Discharge Instructions      - As discussed, there are abnormalities on your EKG from previous EKGs.  I am concerned that could be a blockage so you need to have labs performed immediately.  We discussed going immediately to the ER.  You state that you will have your mother take you to the ER and do not want EMS. - Go immediately to the ER and do not delay.  You have been advised to follow up immediately in the emergency department for concerning signs.symptoms. If you declined EMS transport, please have a family member take you directly to the ED at this time. Do not delay. Based on concerns about condition, if you do not follow up in th e ED, you may risk poor outcomes including worsening of condition, delayed treatment and potentially life threatening issues. If you have declined to go to the ED at this time, you should call your PCP immediately to set up a follow up appointment.  Go to ED  for red flag symptoms, including; fevers you cannot reduce with Tylenol /Motrin , severe headaches, vision changes, numbness/weakness in part of the body, lethargy, confusion, intractable vomiting, severe dehydration, chest pain, breathing difficulty, severe persistent abdominal or pelvic pain, signs of severe infection (increased redness, swelling of an area), feeling faint or passing out, dizziness, etc. You should especially go to the ED for sudden acute worsening of condition if you do not elect to go at this time.      ED Prescriptions   None    PDMP not reviewed this encounter.     [1]  Social History Tobacco Use   Smoking status: Some Days    Current packs/day: 1.50    Average packs/day: 1.5 packs/day for 44.1 years (66.1 ttl pk-yrs)    Types: Cigarettes    Start date: 29   Smokeless tobacco: Never   Tobacco comments:    1 ppd   Vaping Use   Vaping status: Never  Used  Substance Use Topics   Alcohol use: Yes    Comment: occassionally   Drug use: No     Arvis Jolan NOVAK, PA-C 08/28/24 1644  "

## 2024-09-09 ENCOUNTER — Ambulatory Visit: Admitting: Nurse Practitioner
